# Patient Record
Sex: Female | Born: 1955
Health system: Southern US, Community
[De-identification: ages and names within clinical notes are randomized; demographics above are authoritative.]

## PROBLEM LIST (undated history)

## (undated) DIAGNOSIS — E669 Obesity, unspecified: Secondary | ICD-10-CM

## (undated) DIAGNOSIS — R0609 Other forms of dyspnea: Secondary | ICD-10-CM

## (undated) DIAGNOSIS — J45909 Unspecified asthma, uncomplicated: Secondary | ICD-10-CM

## (undated) DIAGNOSIS — Z8679 Personal history of other diseases of the circulatory system: Secondary | ICD-10-CM

## (undated) DIAGNOSIS — R7303 Prediabetes: Secondary | ICD-10-CM

## (undated) DIAGNOSIS — J302 Other seasonal allergic rhinitis: Secondary | ICD-10-CM

## (undated) DIAGNOSIS — R739 Hyperglycemia, unspecified: Secondary | ICD-10-CM

## (undated) DIAGNOSIS — E785 Hyperlipidemia, unspecified: Secondary | ICD-10-CM

## (undated) DIAGNOSIS — G5600 Carpal tunnel syndrome, unspecified upper limb: Secondary | ICD-10-CM

## (undated) DIAGNOSIS — F419 Anxiety disorder, unspecified: Secondary | ICD-10-CM

## (undated) DIAGNOSIS — I10 Essential (primary) hypertension: Secondary | ICD-10-CM

## (undated) DIAGNOSIS — E7800 Pure hypercholesterolemia, unspecified: Secondary | ICD-10-CM

## (undated) DIAGNOSIS — F32A Depression, unspecified: Secondary | ICD-10-CM

## (undated) DIAGNOSIS — F329 Major depressive disorder, single episode, unspecified: Secondary | ICD-10-CM

## (undated) DIAGNOSIS — I471 Supraventricular tachycardia: Secondary | ICD-10-CM

## (undated) DIAGNOSIS — R002 Palpitations: Secondary | ICD-10-CM

## (undated) DIAGNOSIS — M7580 Other shoulder lesions, unspecified shoulder: Secondary | ICD-10-CM

## (undated) HISTORY — DX: Essential (primary) hypertension: I10

## (undated) HISTORY — DX: Palpitations: R00.2

## (undated) HISTORY — PX: BREAST BIOPSY: SHX20

## (undated) HISTORY — DX: Hyperlipidemia, unspecified: E78.5

## (undated) HISTORY — DX: Personal history of other diseases of the circulatory system: Z86.79

## (undated) HISTORY — DX: Anxiety disorder, unspecified: F41.9

## (undated) HISTORY — DX: Supraventricular tachycardia: I47.1

## (undated) HISTORY — DX: Other seasonal allergic rhinitis: J30.2

## (undated) HISTORY — DX: Other forms of dyspnea: R06.09

## (undated) HISTORY — DX: Pure hypercholesterolemia, unspecified: E78.00

## (undated) HISTORY — DX: Depression, unspecified: F32.A

## (undated) HISTORY — PX: TOTAL ABDOMINAL HYSTERECTOMY: SHX209

## (undated) HISTORY — DX: Hyperglycemia, unspecified: R73.9

## (undated) HISTORY — PX: TUBAL LIGATION: SHX77

## (undated) HISTORY — DX: Unspecified asthma, uncomplicated: J45.909

## (undated) HISTORY — DX: Carpal tunnel syndrome, unspecified upper limb: G56.00

## (undated) HISTORY — DX: Prediabetes: R73.03

## (undated) HISTORY — DX: Other shoulder lesions, unspecified shoulder: M75.80

## (undated) HISTORY — DX: Obesity, unspecified: E66.9

## (undated) HISTORY — DX: Major depressive disorder, single episode, unspecified: F32.9

---

## 1999-05-26 ENCOUNTER — Other Ambulatory Visit: Admission: RE | Admit: 1999-05-26 | Discharge: 1999-05-26 | Payer: Self-pay | Admitting: Obstetrics and Gynecology

## 2000-08-01 ENCOUNTER — Other Ambulatory Visit: Admission: RE | Admit: 2000-08-01 | Discharge: 2000-08-01 | Payer: Self-pay | Admitting: Obstetrics and Gynecology

## 2001-09-30 ENCOUNTER — Emergency Department (HOSPITAL_COMMUNITY): Admission: EM | Admit: 2001-09-30 | Discharge: 2001-10-01 | Payer: Self-pay | Admitting: Emergency Medicine

## 2002-05-02 ENCOUNTER — Other Ambulatory Visit: Admission: RE | Admit: 2002-05-02 | Discharge: 2002-05-02 | Payer: Self-pay | Admitting: Obstetrics and Gynecology

## 2004-02-03 ENCOUNTER — Other Ambulatory Visit: Admission: RE | Admit: 2004-02-03 | Discharge: 2004-02-03 | Payer: Self-pay | Admitting: Obstetrics and Gynecology

## 2005-07-15 ENCOUNTER — Encounter: Payer: Self-pay | Admitting: Orthopedic Surgery

## 2005-08-02 ENCOUNTER — Ambulatory Visit: Payer: Self-pay | Admitting: Orthopedic Surgery

## 2005-08-04 ENCOUNTER — Ambulatory Visit (HOSPITAL_COMMUNITY): Admission: RE | Admit: 2005-08-04 | Discharge: 2005-08-04 | Payer: Self-pay | Admitting: Orthopedic Surgery

## 2005-08-04 ENCOUNTER — Encounter: Payer: Self-pay | Admitting: Orthopedic Surgery

## 2005-08-24 ENCOUNTER — Emergency Department (HOSPITAL_COMMUNITY): Admission: EM | Admit: 2005-08-24 | Discharge: 2005-08-24 | Payer: Self-pay | Admitting: Emergency Medicine

## 2005-09-06 ENCOUNTER — Encounter: Payer: Self-pay | Admitting: Orthopedic Surgery

## 2005-10-13 ENCOUNTER — Encounter: Admission: RE | Admit: 2005-10-13 | Discharge: 2005-11-03 | Payer: Self-pay | Admitting: Orthopedic Surgery

## 2006-04-11 ENCOUNTER — Other Ambulatory Visit: Admission: RE | Admit: 2006-04-11 | Discharge: 2006-04-11 | Payer: Self-pay | Admitting: Obstetrics and Gynecology

## 2006-04-21 ENCOUNTER — Encounter: Admission: RE | Admit: 2006-04-21 | Discharge: 2006-04-21 | Payer: Self-pay | Admitting: Obstetrics and Gynecology

## 2006-05-04 ENCOUNTER — Encounter: Admission: RE | Admit: 2006-05-04 | Discharge: 2006-05-04 | Payer: Self-pay | Admitting: Obstetrics and Gynecology

## 2006-05-19 ENCOUNTER — Encounter: Admission: RE | Admit: 2006-05-19 | Discharge: 2006-05-19 | Payer: Self-pay | Admitting: Obstetrics and Gynecology

## 2006-05-19 ENCOUNTER — Encounter (INDEPENDENT_AMBULATORY_CARE_PROVIDER_SITE_OTHER): Payer: Self-pay | Admitting: Specialist

## 2006-11-16 ENCOUNTER — Encounter (INDEPENDENT_AMBULATORY_CARE_PROVIDER_SITE_OTHER): Payer: Self-pay | Admitting: Specialist

## 2006-11-17 ENCOUNTER — Inpatient Hospital Stay (HOSPITAL_COMMUNITY): Admission: RE | Admit: 2006-11-17 | Discharge: 2006-11-18 | Payer: Self-pay | Admitting: Obstetrics and Gynecology

## 2006-11-28 ENCOUNTER — Encounter: Admission: RE | Admit: 2006-11-28 | Discharge: 2006-11-28 | Payer: Self-pay | Admitting: Obstetrics and Gynecology

## 2007-05-04 ENCOUNTER — Encounter: Admission: RE | Admit: 2007-05-04 | Discharge: 2007-05-04 | Payer: Self-pay | Admitting: Obstetrics and Gynecology

## 2007-07-04 DIAGNOSIS — Z8679 Personal history of other diseases of the circulatory system: Secondary | ICD-10-CM

## 2007-07-04 HISTORY — DX: Personal history of other diseases of the circulatory system: Z86.79

## 2007-07-05 ENCOUNTER — Ambulatory Visit: Payer: Self-pay | Admitting: Orthopedic Surgery

## 2007-07-05 DIAGNOSIS — G5600 Carpal tunnel syndrome, unspecified upper limb: Secondary | ICD-10-CM | POA: Insufficient documentation

## 2007-07-05 HISTORY — DX: Carpal tunnel syndrome, unspecified upper limb: G56.00

## 2008-07-26 ENCOUNTER — Encounter: Admission: RE | Admit: 2008-07-26 | Discharge: 2008-07-26 | Payer: Self-pay | Admitting: Obstetrics and Gynecology

## 2008-08-06 ENCOUNTER — Encounter: Admission: RE | Admit: 2008-08-06 | Discharge: 2008-08-06 | Payer: Self-pay | Admitting: Obstetrics and Gynecology

## 2010-01-23 ENCOUNTER — Encounter: Admission: RE | Admit: 2010-01-23 | Discharge: 2010-01-23 | Payer: Self-pay | Admitting: Obstetrics and Gynecology

## 2010-03-09 ENCOUNTER — Ambulatory Visit: Payer: Self-pay | Admitting: Orthopedic Surgery

## 2010-03-09 DIAGNOSIS — M7580 Other shoulder lesions, unspecified shoulder: Secondary | ICD-10-CM

## 2010-03-09 DIAGNOSIS — M25819 Other specified joint disorders, unspecified shoulder: Secondary | ICD-10-CM

## 2010-03-09 HISTORY — DX: Other specified joint disorders, unspecified shoulder: M25.819

## 2010-03-10 ENCOUNTER — Telehealth: Payer: Self-pay | Admitting: Orthopedic Surgery

## 2010-05-10 ENCOUNTER — Emergency Department (HOSPITAL_COMMUNITY): Admission: EM | Admit: 2010-05-10 | Discharge: 2010-05-10 | Payer: Self-pay | Admitting: Emergency Medicine

## 2010-09-06 ENCOUNTER — Encounter: Payer: Self-pay | Admitting: Obstetrics and Gynecology

## 2010-09-17 NOTE — Assessment & Plan Note (Signed)
Summary: RT SHOULDER PAIN NEEDS XR/UHC/BSF   Vital Signs:  Patient profile:   55 year old female Height:      65 inches Weight:      168 pounds Pulse rate:   80 / minute Resp:     16 per minute  Vitals Entered By: Fuller Canada MD (March 09, 2010 11:43 AM)  Visit Type:  new patient Referring Provider:  self Primary Provider:  na  CC:  right shoulder pain.  History of Present Illness: I saw Kathryn Gallegos in the office today for an initial visit.  She is a 55 years old woman with the complaint of:  right shoulder pain.  Xrays today.  Had MRI of C spine in 2006 showed mild multiple level disc bulges which really looks minor  She does complain of pain in her RIGHT shoulder after a fall during the winter and now has tall 6/10 intermittent pain which came on suddenly and is worse when she lies on her LEFT side in fact it wakes her up at night.  Her range of motion remains good she does not have any numbness or weakness she's been using Aspercreme for pain she does have a history of reflux and is recently been taken off of Nexium because her reflux improved.  Meds: Lisinopril, Ibuprofen 200 as needed, no relief.  Allergies (verified): No Known Drug Allergies  Past History:  Past Medical History: High Blood Pressure Reflux Sinus Allergies DDD of cervical spine diabetic  Past Surgical History: Tubal ligation 1985 hysterectomy  Family History: FH of Cancer:  Family History of Diabetes Family History Coronary Heart Disease female < 41 Family History of Arthritis  Social History: Patient is married.  medical insurance no smoking no alcohol no caffeine ass. degree  Review of Systems Constitutional:  Denies weight loss, weight gain, fever, chills, and fatigue. Cardiovascular:  Denies chest pain, palpitations, fainting, and murmurs. Respiratory:  Denies short of breath, wheezing, couch, tightness, pain on inspiration, and snoring . Gastrointestinal:  Denies  heartburn, nausea, vomiting, diarrhea, constipation, and blood in your stools. Genitourinary:  Denies frequency, urgency, difficulty urinating, painful urination, flank pain, and bleeding in urine. Neurologic:  Denies numbness, tingling, unsteady gait, dizziness, tremors, and seizure. Musculoskeletal:  Denies joint pain, swelling, instability, stiffness, redness, heat, and muscle pain. Endocrine:  Denies excessive thirst, exessive urination, and heat or cold intolerance. Psychiatric:  Denies nervousness, depression, anxiety, and hallucinations. Skin:  Denies changes in the skin, poor healing, rash, itching, and redness. HEENT:  Denies blurred or double vision, eye pain, redness, and watering. Immunology:  Denies seasonal allergies, sinus problems, and allergic to bee stings. Hemoatologic:  Denies easy bleeding and brusing.  Physical Exam  Msk:  The patient is well developed and nourished, with normal grooming and hygiene. The body habitus is  small, ectomorphic Pulses:  The pulses and perfusion were normal with normal color, temperature  and no swelling  Extremities:  cervical spine nontender normal range of motion normal muscle tone no deformity  RIGHT shoulder painful forward elevation with a positive impingement sign, her shoulder remains stable.  Internal rotators external rotators and supraspinatus have grade 5 strength there is full range of motion in flexion and in external rotation with decreased range of motion and internal rotation with a level of approximately T12 vs. T7 on the opposite side, there is some tenderness over the anterolateral acromion and in the area inferior to the posterior acromion no swelling.  LEFT shoulder full forward elevation full range  of motion full strength no instability no tenderness impingement sign was negative Neurologic:  The coordination and sensation were normal  The reflexes were normal   Skin:  intact without lesions or rashes Cervical Nodes:   no significant adenopathy Axillary Nodes:  no significant adenopathy Psych:  alert and cooperative; normal mood and affect; normal attention span and concentration   Impression & Recommendations:  Problem # 1:  IMPINGEMENT SYNDROME (ICD-726.2) Assessment New  Orders: New Patient Level III (65784) Joint Aspirate / Injection, Large (20610) Depo- Medrol 40mg  (J1030) Verbal consent obtained/The shoulder was injected with depomedrol 40mg /cc and sensorcaine .25% . There were no complications  Patient Instructions: 1)  rotator cuff tendonitis and bursitis  2)  You have received an injection of cortisone today. You may experience increased pain at the injection site. Apply ice pack to the area for 20 minutes every 2 hours and take 2 xtra strength tylenol every 8 hours. This increased pain will usually resolve in 24 hours. The injection will take effect in 3-10 days.  3)  do the exercises for the shoudlers for 6 weeks  4)  take norco for pain  5)  Please schedule a follow-up appointment as needed.

## 2010-09-17 NOTE — Letter (Signed)
Summary: History form  History form   Imported By: Jacklynn Ganong 03/11/2010 08:19:21  _____________________________________________________________________  External Attachment:    Type:   Image     Comment:   External Document

## 2010-09-17 NOTE — Progress Notes (Signed)
Summary: Office Visit  Office Visit   Imported By: Eldridge Dace 07/04/2007 11:49:43  _____________________________________________________________________  External Attachment:    Type:   Image     Comment:   initial evaluation

## 2010-09-17 NOTE — Progress Notes (Signed)
Summary: patient asking for Rx for CT  Phone Note Call from Patient   Caller: Patient Summary of Call: Patient called in, seen yesterday 7/25 for RT shoulder; states forgot to mention CT RT/numbness, prev seen for this prob in 2008. Asking if Dr Romeo Apple would prescribe Rx for this?  If so, [facility] in [place],  [place] # [phone] Initial call taken by: Cammie Sickle,  March 10, 2010 9:20 AM  Follow-up for Phone Call        vit b6 100mg  two times a day x 6 weeks   neurontin 100mg  three times a day  for 6 weeks  Follow-up by: Fuller Canada MD,  March 10, 2010 2:17 PM  Additional Follow-up for Phone Call Additional follow up Details #1::        called meds in, advised pt    New/Updated Medications: NEURONTIN 100 MG CAPS (GABAPENTIN) 1 by mouth two times a day for 6 weeks VITAMIN B-6 100 MG TABS (PYRIDOXINE HCL) one by mouth two times a day for 6 weeks Prescriptions: VITAMIN B-6 100 MG TABS (PYRIDOXINE HCL) one by mouth two times a day for 6 weeks  #60 x 1   Entered by:   Ether Griffins   Authorized by:   Fuller Canada MD   Signed by:   Ether Griffins on 03/10/2010   Method used:   Historical   RxID:   [id number] NEURONTIN 100 MG CAPS (GABAPENTIN) 1 by mouth two times a day for 6 weeks  #60 x 2   Entered by:   Ether Griffins   Authorized by:   Fuller Canada MD   Signed by:   Ether Griffins on 03/10/2010   Method used:   Historical   RxID:   [id number]

## 2011-01-01 NOTE — H&P (Signed)
NAME:  Kathryn Gallegos, Kathryn Gallegos NO.:  [id number]   MEDICAL RECORD NO.:  [id number]          PATIENT TYPE:  AMB   LOCATION:  SDC                           [facility]ILITY:  [facility]   PHYSICIAN:  Janine Limbo, M.D.DATE OF BIRTH:  21-Feb-1956   DATE OF ADMISSION:  DATE OF DISCHARGE:                              HISTORY & PHYSICAL   DATE OF SURGERY:  November 16, 2006.   HISTORY OF PRESENT ILLNESS:  Ms. Campau is a 55 year old female,  para 0-0-4-0, who presents for a laparoscopy-assisted vaginal  hysterectomy.  The patient has been followed at the [facility] of [facility].  The patient  complains of heavy and irregular periods.  An ultrasound was performed  that showed a uterus that measured 8.4 cm x 7.9 cm with multiple  fibroids.  The largest fibroid measured 4.14 cm.  The left ovary was  surgically absent.  The right ovary appeared normal.  The patient has  had a left ectopic pregnancy in the past and then was told of an  infection.  She had a laparotomy with a left salpingo-oophorectomy.  The the patient's last Pap smear was within normal limits.  An  endometrial biopsy was within normal limits.   OBSTETRICAL HISTORY:  The patient has had 1 ectopic pregnancy (1985) and  3 first trimester miscarriages.   PAST MEDICAL HISTORY:  The patient has a history of hypertension as well  as seasonal allergies.  She currently takes Claritin and lisinopril.  The patient had a breast biopsy in 2007 with benign findings.   DRUG ALLERGIES:  No known drug allergies or latex allergies.   SOCIAL HISTORY:  The patient drinks alcohol socially.  She denies  cigarette use and other recreational drug uses.   REVIEW OF SYSTEMS:  Please see history of present illness.   FAMILY HISTORY:  The patient's maternal aunt and uncle have  hypertension.  She has a first cousin with diabetes.  The patient's  mother had pancreatic cancer and her maternal  grandmother had esophageal  cancer.   PHYSICAL EXAMINATION:  Weight is 178 pounds.  Height is 5 feet 4 inches.  HEENT:  Is within normal limits.  CHEST:  Is clear.  HEART:  Regular rate and rhythm.  Her breasts are without masses.  Her abdomen is nontender.  Her extremities are grossly normal.  Her neurologic exam is grossly normal.  PELVIC EXAM:  External genitalia is normal.  Vagina is normal.  Cervix  is nontender.  Uterus is 8 weeks' size.  Adnexa no masses and  rectovaginal exam confirms.   ASSESSMENT:  1. Fibroid uterus.  2. Menorrhagia.  3. History of laparotomy with a left salpingo-oophorectomy.  The      patient was told that she had an infection.   PLAN:  The patient will undergo a laparoscopy-assisted vaginal  hysterectomy.  The patient understands the indications for her surgical  procedure, and she accepts the risks of, but not limited to, anesthetic  complications, bleeding, infections, and possible damage to the  surrounding organs.  We discussed ovarian preservation.  The  patient  strongly wants to keep her ovary.  She understands the risk and benefits  of ovarian preservation compared to the risk and benefits of ovarian  removal and the possibility of subsequent hormone replacement therapy.      Janine Limbo, M.D.  Electronically Signed     AVS/MEDQ  D:  11/09/2006  T:  11/09/2006  Job:  [id number]

## 2011-01-01 NOTE — Discharge Summary (Signed)
NAME:  Kathryn Gallegos, TARNOW NO.:  [id number]   MEDICAL RECORD NO.:  [id number]          PATIENT TYPE:  INP   LOCATION:  9317                          [facility]ILITY:  [facility]   PHYSICIAN:  Janine Limbo, M.D.DATE OF BIRTH:  1955-10-26   DATE OF ADMISSION:  11/16/2006  DATE OF DISCHARGE:  11/18/2006                               DISCHARGE SUMMARY   DISCHARGE DIAGNOSES:  1. Fibroid uterus.  2. Menorrhagia.  3. Status post laparotomy for left ectopic pregnancy.  4. Obesity.  5. Hypertension.  6. Pelvic adhesions.  7. Right ovarian cyst.   OPERATION:  On the day of admission the patient underwent a  laparoscopically-assisted vaginal hysterectomy with lysis of adhesions  and uterine morcellation, tolerating procedure well.  The patient was  found to have a 14 weeks size uterus with multiple fibroids along with  pelvic adhesions.   HISTORY OF PRESENT ILLNESS:  [name] is a 55 year old female para  0-0-4-0 who presents for a laparoscopically-assisted vaginal  hysterectomy because of symptomatic uterine fibroids.  Please see the  patient's dictated history and physical examination for details.   PREOP PHYSICAL EXAM:  VITAL SIGNS:  Weight 220 pounds, height 5 feet 4  inches tall.  GENERAL:  Exam is within normal limits.  PELVIC:  External genitalia is normal, vagina is normal, cervix is  nontender.  Uterus appeared 8 weeks size.  Adnexa no masses.  Rectovaginal exam confirms.   HOSPITAL COURSE:  On the date of admission the patient underwent  aforementioned procedures tolerating them all well.  The patient's  hospital course was unremarkable with patient resuming bowel and bladder  function by postop day #2 and therefore deemed ready for discharge home.  Postop hemoglobin 12.2 (preop hemoglobin 14.1).   DISCHARGE MEDICATIONS:  1. Ibuprofen 600 mg 1 tablet every 6 hours with food as needed for      mild to moderate pain.  2. Darvocet N 100 1-2 tablets every 4  hours as needed for severe pain.  3. Vicodin 1-2 tablets every 4 hours as needed for severe pain.  4. Phenergan 25 mg 1 tablet every 6 hours as needed for nausea.  5. Iron 325 mg 1 tablet twice daily for 6 weeks.   FOLLOW UP:  The patient is to call to schedule a 3-week follow-up visit  with Dr. Stefano Gaul at [phone].   DISCHARGE INSTRUCTIONS:  The patient was given a copy of [facility] postoperative instruction sheet.  She was further  advised to avoid driving for 1-2 weeks, heavy lifting for 4 weeks,  intercourse for 6 weeks, that she may shower, walk up steps, is to  increase her activities slowly.  She was further advised to have a low-  salt diet.   FINAL PATHOLOGY:  Uterus hysterectomy:  Benign cervix with chronic  cervicitis; benign secretory endometrium; and leiomyomata.      [name].      Janine Limbo, M.D.  Electronically Signed    EJP/MEDQ  D:  12/19/2006  T:  12/19/2006  Job:  [id number]

## 2011-01-01 NOTE — Op Note (Signed)
NAME:  Kathryn Gallegos, Kathryn Gallegos NO.:  [id number]   MEDICAL RECORD NO.:  [id number]          PATIENT TYPE:  AMB   LOCATION:  SDC                           [facility]   PHYSICIAN:  Janine Limbo, M.D.DATE OF BIRTH:  [birth year]   DATE OF PROCEDURE:  11/16/2006  DATE OF DISCHARGE:                               OPERATIVE REPORT   PREOPERATIVE DIAGNOSIS:  1. Fibroid uterus.  2. Menorrhagia  3. History of a laparotomy for a left ectopic pregnancy.  The patient      was told that she had an infection.  4. Obesity (weight 220 pounds, height 5 feet 4 inches).  5. Hypertension.   POSTOPERATIVE DIAGNOSIS:  1. Fibroid uterus.  2. Menorrhagia  3. History of a laparotomy for a left ectopic pregnancy.  The patient      was told that she had an infection.  4. Obesity (weight 220 pounds, height 5 feet 4 inches).  5. Hypertension.  6. Pelvic adhesions.  7. Right ovarian cyst.   OPERATION/PROCEDURE:  1. Laparoscopically-assisted vaginal hysterectomy.  2. Laparoscopic lysis of adhesions.  3. Uterine morcellation.   SURGEON:  Janine Limbo, M.D.   FIRST ASSISTANT:  Naima A. Normand Sloop, M.D.   ANESTHESIA:  General.   INDICATIONS:  Mrs. Kathryn Gallegos is 55 year old female, para 0-0-4-0, who  presents with the above-mentioned diagnoses.  She has been followed at  the [facility].  She presents for a laparoscopically-assisted vaginal hysterectomy.  We  discussed our management options and the risk associated with each of  those options.  The patient has elected to proceed as mentioned above.  She understands the specific risks associated with LAVH.  which include,  but are not limited to, anesthetic complications, bleeding, infection,  and possible damage to the surrounding organs.  We discussed the  benefits and the risk of oophorectomy as well as the benefits and the  risk of ovarian preservation.  The patient is not sure  whether or not  her left ovary was removed at the time of laparotomy many years ago.  After carefully considering her options, the patient has elected to keep  her ovaries if at all possible.   FINDINGS:  The uterus was approximately 14 weeks size and it contained  multiple subserosal and intramural fibroids.  The estimated weight of  the uterus was approximately 370 grams.  The left fallopian tube and  left ovary were surgically absent.  The right ovary contained a 4 cm  clear cyst that was thought to be consistent with ovulation.  There were  moderate to dense adhesions present in the pelvis and the abdomen.  The  omentum and the large bowel were adherent to the right anterior  abdominal wall.  The large bowel was adherent to the right pelvic  sidewall.  The right fallopian tube and the right ovary were adherent to  the right posterior uterus.  There were filmy adhesions between the  large bowel and the right ovary.  There were mild to moderate adhesions  between the large bowel and the left pelvic  sidewall.  There were  moderate adhesions between the omentum and the right midabdominal  anterior wall.  The anterior cul-de-sac was clear and free.  The  posterior cul-de-sac was clear and free.  The bowel otherwise appeared  normal.  The liver appeared normal.  The start time for the procedure  was 9:25 a.m..  The ending time for the procedure was 11:53 a.m.Marland Kitchen   DESCRIPTION OF PROCEDURE:  The patient was taken to the operating room  where general anesthetic was given.  The patient's abdomen, perineum,  and vagina were prepped with multiple layers of Betadine.  A Foley  catheter was placed in the bladder.  Examination under anesthesia was  performed.  The uterus was noted to be larger than previously suspected.  A Hulka tenaculum was placed inside the uterus.  The patient was then  sterilely draped.  The subumbilical area was injected with 4 mL of 0.5%  Marcaine with epinephrine.  A  subumbilical incision was made and the  Veress needle was inserted into the abdominal cavity without difficulty.  Proper placement was confirmed using the saline drop test.  A  pneumoperitoneum was then obtained.  The laparoscopic trocar was then  substituted for the Veress needle.  The laparoscope was inserted and the  pelvis was visualized with findings as mentioned above.  The left lower  abdomen was injected with 3 mL of 0.5% Marcaine with epinephrine.  An  incision was made and a 5 mm trocar was placed in the lower abdomen  under direct visualization.  Lysis of adhesions was performed so that we  could insert the trocar in the right lower quadrant.  The adhesions  between the large bowel and the right anterior abdominal wall were then  lysed.  The adhesions between the right fallopian tube and the right  ovary and the right pelvic sidewall and anterior abdominal wall were  lysed.  At this point we felt comfortable placing our second suprapubic  port.  The right lower abdomen was injected with 3 mL of 5 Marcaine with  epinephrine.  An incision was made.  A 5 mm trocar was placed under  direct visualization.  At this point,  we then continued our lysis of  adhesions so that we could ultimately perform our laparoscopically-  assisted vaginal hysterectomy.  The adhesions between the right ovary  and the right posterior uterus were then lysed using a combination of  sharp dissection and cautery of the vessels in the adhesions.  Care was  taken not to damage the ovary, the bowel, or any of the vital pelvic  structures.  Once the adhesions were all lysed, we then identified the  right round ligament.  The right round ligament was then cauterized in  several sections.  The bipolar cautery was used.  The right round  ligament was then cut.  We identified the right fallopian tube and a  proximal portion was cauterized and cut.  We developed the bladder flap anteriorly.  We then cauterized the  right utero-ovarian ligament.  The  ligament was cut.  The parametrial tissues were then sharply and bluntly  pushed from the uterine artery.  Care was taken again not to damage the  ureters or any of the vital structures.  Pictures were taken of the  previous portions of the operative procedure.  At this point we felt  that we were ready to proceed with the vaginal portion of the procedure.  The patient was placed in a more  lithotomy position.  The cervix was  injected with a diluted solution of Pitressin and saline.  A  circumferential incision was made around the cervix and the vaginal  mucosa was advanced anteriorly and posteriorly.  The posterior cul-de-  sac was sharply entered.  Alternating from right to left, the  uterosacral ligaments, paracervical tissues, parametrial tissues, and  the uterine arteries were clamped, cut, sutured, and tied securely.  We  tried to invert the uterus through the posterior colpotomy, but we were  not successful because of the large fibroids.  Therefore, we began to  morcellate the uterus by removing fibroids from the posterior surface  and parts of the myometrium.  We were finally able to invert the uterus  through the posterior colpotomy.  There was only a small portion of  tissue remaining at the upper pedicles.  This tissue was clamped and cut  and the uterus was removed from the operative field, although it was  then multiple pieces.  The estimated weight of the specimen was 370  grams.  Hemostasis was achieved using figure-of-eight sutures.  The  pelvis was carefully inspected and hemostasis was noted to be adequate.  The sutures attached to the uterosacral ligaments were brought out  through the vaginal angles and tied securely.  A McCall culdoplasty  suture was placed in the posterior cul-de-sac, incorporating the  uterosacral ligaments bilaterally and the posterior peritoneum.  A final  check was made for hemostasis and again hemostasis was  confirmed  throughout.  The vaginal cuff was closed using figure-of-eight sutures  incorporating the anterior vaginal mucosa, the anterior peritoneum,  posterior peritoneum, and the posterior vaginal mucosa.  The McCall  culdoplasty suture was tied securely and the apex of the vagina was  noted to elevate into the mid pelvis and 0 Vicryl was the suture  material used to this point in the procedure.  Sponge, needle, and  instrument counts were correct on two occasions.  The estimated blood  loss for the procedure was 150 mL.  The patient was noted to drain clear  yellow urine.   The operator then changed gloves.  An apron was placed over the current  gown.  The patient was placed in a more supine position.  The  pneumoperitoneum was reestablished.  The laparoscope was again inserted.  The right ovary was carefully inspected and was noted to be normal.  The  cyst on the right ovary drained during the operative procedure and clear fluid was noted.  Hemostasis was noted to be adequate throughout.  The  ureters were identified and there was no evidence of damage to the  ureters or any of the vital pelvic structures.  The bowel was carefully  inspected and there was no evidence of damage to the bowel.  The pelvis  was irrigated and the irrigation fluid was aspirated.  At this point we  felt that we were ready to end our procedure.  The suprapubic trocars  were removed under direct visualization.  The pneumoperitoneum was  allowed to escape.  Hemostasis was again confirmed.  The subumbilical  trocar was removed and there was no evidence of entangle the bowel.  The  fascia of the subumbilical incision was closed using a figure-of-eight  suture of 0 Vicryl.  The skin of all incisions was closed using 3-0  Monocryl.  Again hemostasis was confirmed.  The patient was noted to  have a slight breathing incident as she was awakened from her  anesthetic, although  a better characterization was not given  to me.  She  did not seem to have any trouble breathing and she was prepared to  report to the recovery room and she was noted to be stable.  The patient  received 1 gram of Ancef on-call to the operating room and she received  30 mg of Toradol intravenously and 30 mg of Toradol intramuscularly  approximately 30 minutes before the termination of the procedure.      Janine Limbo, M.D.  Electronically Signed     AVS/MEDQ  D:  11/16/2006  T:  11/16/2006  Job:  [id number]

## 2011-03-15 ENCOUNTER — Other Ambulatory Visit: Payer: Self-pay | Admitting: Orthopedic Surgery

## 2011-03-15 DIAGNOSIS — R52 Pain, unspecified: Secondary | ICD-10-CM

## 2011-04-16 ENCOUNTER — Other Ambulatory Visit: Payer: Self-pay | Admitting: Obstetrics and Gynecology

## 2011-04-16 DIAGNOSIS — Z1231 Encounter for screening mammogram for malignant neoplasm of breast: Secondary | ICD-10-CM

## 2011-04-29 ENCOUNTER — Ambulatory Visit
Admission: RE | Admit: 2011-04-29 | Discharge: 2011-04-29 | Disposition: A | Discharge status: Home / Self Care | Payer: 59 | Source: Ambulatory Visit | Attending: Obstetrics and Gynecology | Admitting: Obstetrics and Gynecology

## 2011-04-29 DIAGNOSIS — Z1231 Encounter for screening mammogram for malignant neoplasm of breast: Secondary | ICD-10-CM

## 2011-05-13 ENCOUNTER — Ambulatory Visit (INDEPENDENT_AMBULATORY_CARE_PROVIDER_SITE_OTHER): Payer: 59 | Admitting: Orthopedic Surgery

## 2011-05-13 ENCOUNTER — Ambulatory Visit: Payer: Self-pay | Admitting: Orthopedic Surgery

## 2011-05-13 ENCOUNTER — Encounter: Payer: Self-pay | Admitting: Orthopedic Surgery

## 2011-05-13 DIAGNOSIS — M25519 Pain in unspecified shoulder: Secondary | ICD-10-CM

## 2011-05-13 DIAGNOSIS — R52 Pain, unspecified: Secondary | ICD-10-CM

## 2011-05-13 MED ORDER — HYDROCODONE-ACETAMINOPHEN 5-325 MG PO TABS: 1.0000 | ORAL_TABLET | Freq: Four times a day (QID) | ORAL | Status: DC | PRN

## 2011-05-13 MED ORDER — METHYLPREDNISOLONE ACETATE 40 MG/ML IJ SUSP: 40.0000 mg | Freq: Once | INTRAMUSCULAR | Status: DC

## 2011-05-13 MED ORDER — GABAPENTIN 100 MG PO CAPS: 100.0000 mg | ORAL_CAPSULE | Freq: Three times a day (TID) | ORAL | Status: DC

## 2011-05-13 MED ORDER — CYCLOBENZAPRINE HCL 10 MG PO TABS: 10.0000 mg | ORAL_TABLET | Freq: Three times a day (TID) | ORAL | Status: AC | PRN

## 2011-05-13 NOTE — Progress Notes (Signed)
Referral  Chief complaint LEFT shoulder pain  The pain started 2 months ago Came on suddenly No injury No treatment Pain described as sharp Pain scale 8/10 Pain constant, occurs morning and night Improved with muscle relaxers and or pain pill Worse with lifting and turning over in bed Fingers become numb at times.  Review of systems positive findings heart palpitations, wheezing, snoring, diarrhea, frequency, joint pain, muscle pain, numbness, depression, excessive thirst and urination, seasonal ALLERGIES. Negative weight loss blurred vision heartburn skin changes easy bleeding  The past, family history and social history have been reviewed and are recorded above  Physical Exam(12) GENERAL: normal development, grooming, and hygiene   CDV: pulses are normal, there is no edema, extremities are warm to touch   Skin: normal, and all extremities  Lymph: nodes were not palpable/normal, cervical and supraclavicular  Psychiatric: awake, alert and oriented  Neuro: normal sensation  MSK neck no tenderness normal range of motion   Neck exam: as noted   1LEFT shoulder, painful for elevation, range of motion is full. Positive impingement sign. Normal strength in the rotator cuff. No instability. Mild tenderness in the middle and anterior deltoid interval.  Imaging:separtate x-rays report  2 views of the LEFT shoulder LEFT shoulder pain Type II acromion, normal glenohumeral joint Impression normal LEFT shoulder  Assessment: Rotator cuff syndrome    Plan: Subacromial injection left shoulder   Subacromial Shoulder Injection Procedure Note  Pre-operative Diagnosis: left RC Syndrome  Post-operative Diagnosis: same  Indications: pain   Anesthesia: ethyl chloride   Procedure Details   Verbal consent was obtained for the procedure. The shoulder was prepped withalcohol and the skin was anesthetized. A 20 gauge needle was advanced into the subacromial space through posterior  approach without difficulty  The space was then injected with 3 ml 1% lidocaine and 1 ml of depomedrol. The injection site was cleansed with isopropyl alcohol and a dressing was applied.  Complications:  None; patient tolerated the procedure well.

## 2011-05-13 NOTE — Patient Instructions (Addendum)
You have received a steroid shot. 15% of patients experience increased pain at the injection site with in the next 24 hours. This is best treated with ice and tylenol extra strength 2 tabs every 8 hours. If you are still having pain please call the office.  Shoulder Pain You were seen today with shoulder pain. The shoulder is a ball and socket joint. The muscles and tendons (rotator cuff) are what keep the shoulder in its joint and stable. This collection of muscles and tendons holds in the head (ball) of the humerus (upper arm bone) in the fossa (cup) of the scapula (shoulder blade). Today no reason was found for your shoulder pain. Often pain in the shoulder may be treated conservatively with temporary immobilization. For example, holding the shoulder in one place using a sling for rest. Physical therapy may be needed if problems continue. HOME CARE INSTRUCTIONS  Apply ice to the sore area for 20 minutes 3 times per day for the first 2 days. Put the ice in a plastic bag. Place a towel between the bag of ice and your skin.   You may want to sleep on several pillows at night to lessen swelling and pain.   Only take over-the-counter or prescription medicines for pain, discomfort, or fever as directed by your caregiver.   It is very important to keep any follow-up appointments in order to avoid any type of permanent shoulder disability or chronic pain problems.  SEEK MEDICAL CARE IF:  Pain in your shoulder increases or new pain develops in your arm, hand, or fingers.   Your hand or fingers are colder than your other hand.   You do not obtain pain relief with the medications or your pain becomes worse.  SEEK IMMEDIATE MEDICAL CARE IF:  Your arm, hand, or fingers are numb or tingling.   Your arm, hand, or fingers are swollen, painful, or turn white or blue.   You develop chest pain or shortness of breath.  MAKE SURE YOU:    Understand these instructions.   Will watch your condition.    Will get help right away if you are not doing well or get worse.  Document Released: 05/12/2005 Document Re-Released: 10/29/2008 [facility] Patient Information 2011 [place], [place].

## 2011-06-23 ENCOUNTER — Ambulatory Visit: Payer: 59 | Admitting: Orthopedic Surgery

## 2011-08-03 ENCOUNTER — Ambulatory Visit: Payer: 59 | Admitting: Orthopedic Surgery

## 2011-08-03 ENCOUNTER — Encounter: Payer: Self-pay | Admitting: Orthopedic Surgery

## 2012-06-07 ENCOUNTER — Other Ambulatory Visit: Payer: Self-pay | Admitting: Obstetrics and Gynecology

## 2012-06-07 DIAGNOSIS — Z1231 Encounter for screening mammogram for malignant neoplasm of breast: Secondary | ICD-10-CM

## 2012-07-11 ENCOUNTER — Ambulatory Visit: Payer: 59

## 2012-07-14 ENCOUNTER — Ambulatory Visit
Admission: RE | Admit: 2012-07-14 | Discharge: 2012-07-14 | Disposition: A | Discharge status: Home / Self Care | Payer: 59 | Source: Ambulatory Visit | Attending: Obstetrics and Gynecology | Admitting: Obstetrics and Gynecology

## 2012-07-14 DIAGNOSIS — Z1231 Encounter for screening mammogram for malignant neoplasm of breast: Secondary | ICD-10-CM

## 2012-07-18 ENCOUNTER — Other Ambulatory Visit: Payer: Self-pay | Admitting: Obstetrics and Gynecology

## 2012-07-18 DIAGNOSIS — R928 Other abnormal and inconclusive findings on diagnostic imaging of breast: Secondary | ICD-10-CM

## 2012-07-26 ENCOUNTER — Encounter: Payer: Self-pay | Admitting: Obstetrics and Gynecology

## 2012-07-26 ENCOUNTER — Ambulatory Visit (INDEPENDENT_AMBULATORY_CARE_PROVIDER_SITE_OTHER): Payer: 59 | Admitting: Obstetrics and Gynecology

## 2012-07-26 VITALS — BP 112/72 | Ht 62.0 in | Wt 181.0 lb

## 2012-07-26 DIAGNOSIS — Z01419 Encounter for gynecological examination (general) (routine) without abnormal findings: Secondary | ICD-10-CM

## 2012-07-26 NOTE — Progress Notes (Signed)
Subjective:    Kathryn Gallegos is a 56 y.o. female No obstetric history on file. who presents for annual exam. The patient is status post hysterectomy for fibroids.  The patient's mother died from pancreatic cancer.  The following portions of the patient's history were reviewed and updated as appropriate: allergies, current medications, past family history, past medical history, past social history, past surgical history and problem list.  Review of Systems Pertinent items are noted in HPI. Gastrointestinal:No change in bowel habits, no abdominal pain, no rectal bleeding Genitourinary:negative for dysuria, frequency, hematuria, nocturia and urinary incontinence    Objective:     BP 112/72  Ht 5\' 2"  (1.575 m)  Wt 181 lb (82.101 kg)  BMI 33.11 kg/m2  Weight:  Wt Readings from Last 1 Encounters:  07/26/12 181 lb (82.101 kg)     BMI: Body mass index is 33.11 kg/(m^2). General Appearance: Alert, appropriate appearance for age. No acute distress HEENT: Grossly normal Neck / Thyroid: Supple, no masses, nodes or enlargement Lungs: clear to auscultation bilaterally Back: No CVA tenderness Breast Exam: No masses or nodes.No dimpling, nipple retraction or discharge. Cardiovascular: Regular rate and rhythm. S1, S2, no murmur Gastrointestinal: Soft, non-tender, no masses or organomegaly  ++++++++++++++++++++++++++++++++++++++++++++++++++++++++  Pelvic Exam: External genitalia: normal general appearance Vaginal: normal without tenderness, induration or masses and relaxation noted, atrophic Cervix: absent Adnexa: normal bimanual exam Uterus: absent Rectovaginal: normal rectal, no masses  ++++++++++++++++++++++++++++++++++++++++++++++++++++++++  Lymphatic Exam: Non-palpable nodes in neck, clavicular, axillary, or inguinal regions  Psychiatric: Alert and oriented, appropriate affect.      Assessment:    Normal gyn exam   Overweight or obese: Yes  Pelvic relaxation:  No  Menopausal symptoms: Yes. Severe: No.  Hypertension  Borderline diabetes has resolved  Mother with pancreatic cancer   Plan:    followup care with her primary physician  Follow-up:  for annual exam  The updated Pap smear screening guidelines were discussed with the patient. The patient requested that I obtain a Pap smear: No.  Kegel exercises discussed: No.  Proper diet and regular exercise were reviewed.  Annual mammograms recommended starting at age 54. Proper breast care was discussed.  Screening colonoscopy is recommended beginning at age 73.  Regular health maintenance was reviewed.  Sleep hygiene was discussed.  Leonard Schwartz M.D.  Regular Periods: no Hyst  Mammogram: yes  Monthly Breast Ex.: yes Exercise: yes  Tetanus < 10 years: yes Seatbelts: yes  NI. Bladder Functn.: yes Abuse at home: no  Daily BM's: yes Stressful Work: yes  Healthy Diet: yes Sigmoid-Colonoscopy: about 25 years ago   Calcium: yes Medical problems this year: none   LAST PAP:2009  Contraception: HYST   Mammogram:  07/14/2012  PCP: Dr.Sanders   PMH: unchanged  FMH: unchanged   Last Bone Scan: never had one  Pt stated no issues today.

## 2012-07-27 ENCOUNTER — Other Ambulatory Visit: Payer: Self-pay | Admitting: Obstetrics and Gynecology

## 2012-07-27 ENCOUNTER — Ambulatory Visit
Admission: RE | Admit: 2012-07-27 | Discharge: 2012-07-27 | Disposition: A | Discharge status: Home / Self Care | Payer: 59 | Source: Ambulatory Visit | Attending: Obstetrics and Gynecology | Admitting: Obstetrics and Gynecology

## 2012-07-27 DIAGNOSIS — R928 Other abnormal and inconclusive findings on diagnostic imaging of breast: Secondary | ICD-10-CM

## 2012-07-27 DIAGNOSIS — R921 Mammographic calcification found on diagnostic imaging of breast: Secondary | ICD-10-CM

## 2012-08-07 ENCOUNTER — Ambulatory Visit
Admission: RE | Admit: 2012-08-07 | Discharge: 2012-08-07 | Disposition: A | Discharge status: Home / Self Care | Payer: 59 | Source: Ambulatory Visit | Attending: Obstetrics and Gynecology | Admitting: Obstetrics and Gynecology

## 2012-08-07 ENCOUNTER — Other Ambulatory Visit: Payer: Self-pay | Admitting: Obstetrics and Gynecology

## 2012-08-07 ENCOUNTER — Other Ambulatory Visit: Payer: Self-pay

## 2012-08-07 DIAGNOSIS — R921 Mammographic calcification found on diagnostic imaging of breast: Secondary | ICD-10-CM

## 2013-05-03 ENCOUNTER — Ambulatory Visit (INDEPENDENT_AMBULATORY_CARE_PROVIDER_SITE_OTHER): Payer: 59 | Admitting: Orthopedic Surgery

## 2013-05-03 VITALS — BP 148/90 | Ht 64.0 in | Wt 181.0 lb

## 2013-05-03 DIAGNOSIS — G5600 Carpal tunnel syndrome, unspecified upper limb: Secondary | ICD-10-CM

## 2013-05-03 MED ORDER — CYCLOBENZAPRINE HCL 10 MG PO TABS: 10.0000 mg | ORAL_TABLET | Freq: Three times a day (TID) | ORAL | Status: DC

## 2013-05-03 MED ORDER — HYDROCODONE-ACETAMINOPHEN 5-325 MG PO TABS: 1.0000 | ORAL_TABLET | Freq: Four times a day (QID) | ORAL | Status: DC | PRN

## 2013-05-03 NOTE — Progress Notes (Signed)
Patient ID: Kathryn Gallegos, female   DOB: [birth year], 56 y.o.   MRN: [id number]  Chief Complaint  Patient presents with  . 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 Interestingly she gets relief with hydrocodone and Flexeril. Her pain is described as burning sharp 8/10 comes and goes worse at night.  Positive findings for review of systems include fatigue palpitations wheezing and snoring frequency excessive thirst seasonal allergies the other systems 8 were reviewed and were normal  BP 148/90  Ht 5\' 4"  (1.626 m)  Wt 181 lb (82.101 kg)  BMI 31.05 kg/m2  She is well-developed and well-nourished grooming and hygiene is normal she is oriented x3 her mood and affect are normal she ambulated without difficulty. Her hands look swollen or tender over the carpal tunnel and thenar eminence she has normal range of motion wrist joints are stable motor function is normal skin is intact pulses are good decreased sensation in the median nerve distribution lymph nodes are negative  Impression bilateral carpal tunnel syndrome  Bilateral carpal tunnel injection  Followup as needed

## 2013-05-03 NOTE — Patient Instructions (Addendum)
You have received a steroid shot. 15% of patients experience increased pain at the injection site with in the next 24 hours. This is best treated with ice and tylenol extra strength 2 tabs every 8 hours. If you are still having pain please call the office.    

## 2013-06-26 ENCOUNTER — Other Ambulatory Visit: Payer: Self-pay

## 2013-06-26 DIAGNOSIS — Z1231 Encounter for screening mammogram for malignant neoplasm of breast: Secondary | ICD-10-CM

## 2013-08-13 ENCOUNTER — Ambulatory Visit
Admission: RE | Admit: 2013-08-13 | Discharge: 2013-08-13 | Disposition: A | Discharge status: Home / Self Care | Payer: 59 | Source: Ambulatory Visit

## 2013-08-13 DIAGNOSIS — Z1231 Encounter for screening mammogram for malignant neoplasm of breast: Secondary | ICD-10-CM

## 2014-07-22 ENCOUNTER — Other Ambulatory Visit: Payer: Self-pay

## 2014-07-22 DIAGNOSIS — Z1231 Encounter for screening mammogram for malignant neoplasm of breast: Secondary | ICD-10-CM

## 2014-08-14 ENCOUNTER — Ambulatory Visit
Admission: RE | Admit: 2014-08-14 | Discharge: 2014-08-14 | Disposition: A | Discharge status: Home / Self Care | Payer: 59 | Source: Ambulatory Visit

## 2014-08-14 DIAGNOSIS — Z1231 Encounter for screening mammogram for malignant neoplasm of breast: Secondary | ICD-10-CM

## 2014-10-03 ENCOUNTER — Other Ambulatory Visit: Payer: Self-pay | Admitting: Gastroenterology

## 2014-10-03 DIAGNOSIS — R1084 Generalized abdominal pain: Secondary | ICD-10-CM

## 2014-10-09 ENCOUNTER — Ambulatory Visit (HOSPITAL_COMMUNITY)
Admission: RE | Admit: 2014-10-09 | Discharge: 2014-10-09 | Disposition: A | Discharge status: Home / Self Care | Payer: 59 | Source: Ambulatory Visit | Attending: Gastroenterology | Admitting: Gastroenterology

## 2014-10-09 DIAGNOSIS — R1013 Epigastric pain: Secondary | ICD-10-CM | POA: Diagnosis present

## 2014-10-09 DIAGNOSIS — R1084 Generalized abdominal pain: Secondary | ICD-10-CM

## 2014-10-09 MED ORDER — IOHEXOL 300 MG/ML  SOLN
100.0000 mL | Freq: Once | INTRAMUSCULAR | Status: AC | PRN
Start: 2014-10-09 — End: 2014-10-09
  Administered 2014-10-09: 80 mL via INTRAVENOUS

## 2014-10-31 ENCOUNTER — Other Ambulatory Visit: Payer: Self-pay | Admitting: Gastroenterology

## 2014-10-31 DIAGNOSIS — R935 Abnormal findings on diagnostic imaging of other abdominal regions, including retroperitoneum: Secondary | ICD-10-CM

## 2014-12-18 ENCOUNTER — Ambulatory Visit (HOSPITAL_COMMUNITY): Admission: RE | Admit: 2014-12-18 | Payer: BLUE CROSS/BLUE SHIELD | Source: Ambulatory Visit

## 2014-12-30 ENCOUNTER — Ambulatory Visit (HOSPITAL_COMMUNITY): Admission: RE | Admit: 2014-12-30 | Payer: BLUE CROSS/BLUE SHIELD | Source: Ambulatory Visit

## 2015-08-21 ENCOUNTER — Other Ambulatory Visit: Payer: Self-pay

## 2015-08-21 DIAGNOSIS — Z1231 Encounter for screening mammogram for malignant neoplasm of breast: Secondary | ICD-10-CM

## 2015-09-02 ENCOUNTER — Ambulatory Visit: Payer: BLUE CROSS/BLUE SHIELD

## 2015-09-11 ENCOUNTER — Ambulatory Visit
Admission: RE | Admit: 2015-09-11 | Discharge: 2015-09-11 | Disposition: A | Discharge status: Home / Self Care | Payer: 59 | Source: Ambulatory Visit

## 2015-09-11 DIAGNOSIS — Z1231 Encounter for screening mammogram for malignant neoplasm of breast: Secondary | ICD-10-CM

## 2016-01-29 ENCOUNTER — Other Ambulatory Visit: Payer: Self-pay | Admitting: Gastroenterology

## 2016-01-29 DIAGNOSIS — R935 Abnormal findings on diagnostic imaging of other abdominal regions, including retroperitoneum: Secondary | ICD-10-CM

## 2016-01-29 DIAGNOSIS — R1013 Epigastric pain: Secondary | ICD-10-CM

## 2016-02-10 ENCOUNTER — Other Ambulatory Visit: Payer: 59

## 2016-02-10 ENCOUNTER — Ambulatory Visit
Admission: RE | Admit: 2016-02-10 | Discharge: 2016-02-10 | Disposition: A | Discharge status: Home / Self Care | Payer: 59 | Source: Ambulatory Visit | Attending: Gastroenterology | Admitting: Gastroenterology

## 2016-02-10 DIAGNOSIS — R935 Abnormal findings on diagnostic imaging of other abdominal regions, including retroperitoneum: Secondary | ICD-10-CM

## 2016-02-10 DIAGNOSIS — R1013 Epigastric pain: Secondary | ICD-10-CM

## 2016-02-10 MED ORDER — GADOBENATE DIMEGLUMINE 529 MG/ML IV SOLN
17.0000 mL | Freq: Once | INTRAVENOUS | Status: AC | PRN
  Administered 2016-02-10: 17 mL via INTRAVENOUS

## 2016-02-20 ENCOUNTER — Other Ambulatory Visit: Payer: Self-pay | Admitting: Gastroenterology

## 2016-02-20 DIAGNOSIS — R1013 Epigastric pain: Secondary | ICD-10-CM

## 2016-03-08 ENCOUNTER — Other Ambulatory Visit (HOSPITAL_COMMUNITY): Payer: 59

## 2016-03-08 ENCOUNTER — Ambulatory Visit (HOSPITAL_COMMUNITY): Payer: 59

## 2016-03-17 ENCOUNTER — Encounter (HOSPITAL_COMMUNITY)
Admission: RE | Admit: 2016-03-17 | Discharge: 2016-03-17 | Disposition: A | Discharge status: Home / Self Care | Payer: 59 | Source: Ambulatory Visit | Attending: Gastroenterology | Admitting: Gastroenterology

## 2016-03-17 ENCOUNTER — Ambulatory Visit (HOSPITAL_COMMUNITY)
Admission: RE | Admit: 2016-03-17 | Discharge: 2016-03-17 | Disposition: A | Discharge status: Home / Self Care | Payer: 59 | Source: Ambulatory Visit | Attending: Gastroenterology | Admitting: Gastroenterology

## 2016-03-17 DIAGNOSIS — R1013 Epigastric pain: Secondary | ICD-10-CM

## 2016-03-17 MED ORDER — TECHNETIUM TC 99M MEBROFENIN IV KIT
5.3000 | PACK | Freq: Once | INTRAVENOUS | Status: AC | PRN
  Administered 2016-03-17: 5.3 via INTRAVENOUS

## 2016-08-11 ENCOUNTER — Other Ambulatory Visit: Payer: Self-pay | Admitting: Obstetrics and Gynecology

## 2016-08-11 DIAGNOSIS — Z1231 Encounter for screening mammogram for malignant neoplasm of breast: Secondary | ICD-10-CM

## 2016-09-13 ENCOUNTER — Ambulatory Visit
Admission: RE | Admit: 2016-09-13 | Discharge: 2016-09-13 | Disposition: A | Discharge status: Home / Self Care | Payer: 59 | Source: Ambulatory Visit | Attending: Obstetrics and Gynecology | Admitting: Obstetrics and Gynecology

## 2016-09-13 DIAGNOSIS — Z1231 Encounter for screening mammogram for malignant neoplasm of breast: Secondary | ICD-10-CM

## 2017-05-07 ENCOUNTER — Emergency Department (HOSPITAL_COMMUNITY): Payer: 59

## 2017-05-07 ENCOUNTER — Encounter (HOSPITAL_COMMUNITY): Payer: Self-pay

## 2017-05-07 ENCOUNTER — Emergency Department (HOSPITAL_COMMUNITY)
Admission: EM | Admit: 2017-05-07 | Discharge: 2017-05-07 | Disposition: A | Discharge status: Home / Self Care | Payer: 59 | Attending: Emergency Medicine | Admitting: Emergency Medicine

## 2017-05-07 DIAGNOSIS — I10 Essential (primary) hypertension: Secondary | ICD-10-CM | POA: Diagnosis not present

## 2017-05-07 DIAGNOSIS — I471 Supraventricular tachycardia: Secondary | ICD-10-CM | POA: Diagnosis not present

## 2017-05-07 DIAGNOSIS — R002 Palpitations: Secondary | ICD-10-CM | POA: Diagnosis present

## 2017-05-07 DIAGNOSIS — Z79899 Other long term (current) drug therapy: Secondary | ICD-10-CM | POA: Insufficient documentation

## 2017-05-07 DIAGNOSIS — R079 Chest pain, unspecified: Secondary | ICD-10-CM | POA: Insufficient documentation

## 2017-05-07 DIAGNOSIS — R0602 Shortness of breath: Secondary | ICD-10-CM | POA: Diagnosis not present

## 2017-05-07 LAB — BASIC METABOLIC PANEL
Anion gap: 11 (ref 5–15)
BUN: 14 mg/dL (ref 6–20)
CO2: 22 mmol/L (ref 22–32)
Calcium: 9.5 mg/dL (ref 8.9–10.3)
Chloride: 107 mmol/L (ref 101–111)
Creatinine, Ser: 1.24 mg/dL — ABNORMAL HIGH (ref 0.44–1.00)
GFR calc Af Amer: 54 mL/min — ABNORMAL LOW (ref >60)
GFR calc non Af Amer: 46 mL/min — ABNORMAL LOW (ref >60)
Glucose, Bld: 159 mg/dL — ABNORMAL HIGH (ref 65–99)
Potassium: 3.6 mmol/L (ref 3.5–5.1)
Sodium: 140 mmol/L (ref 135–145)

## 2017-05-07 LAB — CBC
HCT: 47.6 % — ABNORMAL HIGH (ref 36.0–46.0)
Hemoglobin: 16.4 g/dL — ABNORMAL HIGH (ref 12.0–15.0)
MCH: 30.3 pg (ref 26.0–34.0)
MCHC: 34.5 g/dL (ref 30.0–36.0)
MCV: 87.8 fL (ref 78.0–100.0)
Platelets: 237 10*3/uL (ref 150–400)
RBC: 5.42 MIL/uL — ABNORMAL HIGH (ref 3.87–5.11)
RDW: 14.1 % (ref 11.5–15.5)
WBC: 7.9 10*3/uL (ref 4.0–10.5)

## 2017-05-07 LAB — I-STAT TROPONIN, ED: Troponin i, poc: 0.02 ng/mL (ref 0.00–0.08)

## 2017-05-07 LAB — TSH: TSH: 0.606 u[IU]/mL (ref 0.350–4.500)

## 2017-05-07 MED ORDER — ADENOSINE 6 MG/2ML IV SOLN
6.0000 mg | Freq: Once | INTRAVENOUS | Status: AC
  Administered 2017-05-07: 6 mg via INTRAVENOUS
  Filled 2017-05-07: qty 2

## 2017-05-07 NOTE — ED Triage Notes (Signed)
Onset 10am chest pain, heart racing.  Pt has h/o of same.

## 2017-05-07 NOTE — ED Notes (Signed)
Patient transported to X-ray 

## 2017-05-07 NOTE — ED Provider Notes (Addendum)
[place] DEPT Provider Note   CSN: [id number] Arrival date & time: 05/07/17  1453     History   Chief Complaint Chief Complaint  Patient presents with  . Chest Pain  . Tachycardia    HPI Kathryn Gallegos is a 61 y.o. female.  HPI Patient has a history of SVT. Has seen Dr. Einar Gip for the same. At around 10:00 today began to feel her heart race. Slight chest pain with it. States she gets anxious when this happens. Has had some episodes in the past but they've always gone away. She is on Zoloft that she says helps control the episodes. His been doing well the last couple days. No fevers or chills. Her primary care doctor has checked a lot of blood work in the past per the patient. Past Medical History:  Diagnosis Date  . Anxiety   . Anxiety and depression   . Borderline diabetes   . HTN (hypertension)   . Seasonal allergies     Patient Active Problem List   Diagnosis Date Noted  . IMPINGEMENT SYNDROME 03/09/2010  . CARPAL TUNNEL SYNDROME 07/05/2007  . HIGH BLOOD PRESSURE 07/04/2007    Past Surgical History:  Procedure Laterality Date  . TOTAL ABDOMINAL HYSTERECTOMY    . TUBAL LIGATION      OB History    No data available       Home Medications    Prior to Admission medications   Medication Sig Start Date End Date Taking? Authorizing Provider  cyclobenzaprine (FLEXERIL) 10 MG tablet Take 1 tablet (10 mg total) by mouth every 8 (eight) hours. 05/03/13   [name], MD  gabapentin (NEURONTIN) 100 MG capsule Take 1 capsule (100 mg total) by mouth 3 (three) times daily. 05/13/11   [name], MD  HYDROcodone-acetaminophen (NORCO/VICODIN) 5-325 MG per tablet Take 1 tablet by mouth every 6 (six) hours as needed. 05/03/13   [name], MD  ibuprofen (ADVIL,MOTRIN) 400 MG tablet Take 400 mg by mouth every 6 (six) hours as needed.      [provider]  lisinopril (PRINIVIL,ZESTRIL) 10 MG tablet  04/09/11   [provider]    sertraline (ZOLOFT) 50 MG tablet  04/02/11   [provider]  temazepam (RESTORIL) 15 MG capsule Take 15 mg by mouth at bedtime as needed.      [provider]    Family History Family History  Problem Relation Age of Onset  . Pancreatic cancer Mother   . Esophageal cancer Maternal Grandmother   . Heart disease Maternal Grandfather   . Heart disease Maternal Uncle   . Colon cancer Maternal Aunt   . Stomach cancer Maternal Aunt   . Arthritis Unknown   . Cancer Unknown   . Diabetes Unknown     Social History Social History  Substance Use Topics  . Smoking status: Never Smoker  . Smokeless tobacco: Never Used  . Alcohol use No     Allergies   Amoxicillin   Review of Systems Review of Systems  Constitutional: Negative for appetite change and unexpected weight change.  HENT: Negative for congestion.   Respiratory: Positive for shortness of breath.   Cardiovascular: Positive for chest pain and palpitations. Negative for leg swelling.  Gastrointestinal: Negative for abdominal pain.  Genitourinary: Negative for dysuria.  Musculoskeletal: Negative for back pain.  Skin: Negative for rash.  Neurological: Negative for tremors and numbness.  Hematological: Negative for adenopathy.  Psychiatric/Behavioral: The patient is nervous/anxious.  Physical Exam Updated Vital Signs BP 127/75   Pulse (!) 103   Temp 98.4 F (36.9 C) (Oral)   Resp 19   SpO2 99%   Physical Exam  Constitutional: She appears well-developed.  HENT:  Head: Normocephalic.  Eyes: Pupils are equal, round, and reactive to light.  Neck: No thyromegaly present.  Cardiovascular:  Tachycardia  Pulmonary/Chest: Effort normal.  Abdominal: There is no tenderness.  Musculoskeletal: Normal range of motion. She exhibits no edema.  Neurological: She is alert.  Skin: Skin is warm. Capillary refill takes less than 2 seconds.  Psychiatric:  Patient appears somewhat anxious     ED  Treatments / Results  Labs (all labs ordered are listed, but only abnormal results are displayed) Labs Reviewed  CBC - Abnormal; Notable for the following:       Result Value   RBC 5.42 (*)    Hemoglobin 16.4 (*)    HCT 47.6 (*)    All other components within normal limits  BASIC METABOLIC PANEL  TSH  I-STAT TROPONIN, ED    EKG  EKG Interpretation  Date/Time:  Saturday May 07 2017 14:57:08 EDT Ventricular Rate:  172 PR Interval:    QRS Duration: 88 QT Interval:  282 QTC Calculation: 477 R Axis:   17 Text Interpretation:  Supraventricular tachycardia Marked ST abnormality, possible inferior subendocardial injury Abnormal ECG Confirmed by Davonna Belling [phone]) on 05/07/2017 3:14:53 PM       Radiology No results found.  Procedures Procedures (including critical care time)  Medications Ordered in ED Medications  adenosine (ADENOCARD) 6 MG/2ML injection 6 mg (6 mg Intravenous Given 05/07/17 1533)     Initial Impression / Assessment and Plan / ED Course  I have reviewed the triage vital signs and the nursing notes.  Pertinent labs & imaging results that were available during my care of the patient were reviewed by me and considered in my medical decision making (see chart for details).     Patient with SVT. Reportedly history of same. Converted with adenosine. Feels better. Labs pending likely. Will discharge home and follow-up with cardiology. Care turned over to [name].  Final Clinical Impressions(s) / ED Diagnoses   Final diagnoses:  SVT (supraventricular tachycardia) ([place])    New Prescriptions New Prescriptions   No medications on file     Davonna Belling, MD 05/07/17 [place], Zaki Gertsch, MD 05/07/17 [phone]

## 2017-06-06 ENCOUNTER — Encounter: Payer: Self-pay | Admitting: Internal Medicine

## 2017-06-06 ENCOUNTER — Ambulatory Visit (INDEPENDENT_AMBULATORY_CARE_PROVIDER_SITE_OTHER): Payer: 59 | Admitting: Internal Medicine

## 2017-06-06 ENCOUNTER — Encounter (INDEPENDENT_AMBULATORY_CARE_PROVIDER_SITE_OTHER): Payer: Self-pay

## 2017-06-06 VITALS — BP 152/82 | HR 95 | Ht 64.0 in | Wt 188.2 lb

## 2017-06-06 DIAGNOSIS — I471 Supraventricular tachycardia: Secondary | ICD-10-CM | POA: Diagnosis not present

## 2017-06-06 NOTE — Patient Instructions (Addendum)
Medication Instructions:  Your physician recommends that you continue on your current medications as directed. Please refer to the Current Medication list given to you today.  -- If you need a refill on your cardiac medications before your next appointment, please call your pharmacy. --  Labwork: None ordered  Testing/Procedures: None ordered  Follow-Up: Your physician wants you to follow-up as needed with Dr. Rayann Heman.    Thank you for choosing [facility]!!   [phone]  Any Other Special Instructions Will Be Listed Below (If Applicable).

## 2017-06-06 NOTE — Progress Notes (Signed)
Electrophysiology Office Note   Date:  06/06/2017   ID:  Kathryn Gallegos, DOB 1956-06-24, MRN [id number]  PCP:  Glendale Chard, MD  Cardiologist:  Dr Einar Gip Primary Electrophysiologist: Thompson Grayer, MD    Chief Complaint  Patient presents with  . New Patient (Initial Visit)    SVT     History of Present Illness: Kathryn Gallegos is a 61 y.o. female who presents today for electrophysiology evaluation.   She is referred by [name] and [name] for further evaluation of SVT.  She reports abrupt onset/offset of tachypalpitations since 2016.  Episodes typical begin and end without triggers/ precipitants.  More recently, she had sustained tachycardia 05/07/17 for which she required adenosine administration at [facility].  She has had no further episodes since that time.  She has been working diligently on stress reduction and lifestyle modification.  Today, she denies symptoms of palpitations, chest pain, shortness of breath, orthopnea, PND, lower extremity edema, claudication, dizziness, presyncope, syncope, bleeding, or neurologic sequela. The patient is tolerating medications without difficulties and is otherwise without complaint today.    Past Medical History:  Diagnosis Date  . Anxiety   . Anxiety and depression   . Asthma   . Borderline diabetes   . Carpal tunnel syndrome 07/05/2007   Qualifier: Diagnosis of  By: [name], [name]    . HIGH BLOOD PRESSURE 07/04/2007   Qualifier: Diagnosis of  By: [name], [name]    . HTN (hypertension)   . Hyperglycemia   . Hyperlipidemia   . IMPINGEMENT SYNDROME 03/09/2010   Qualifier: Diagnosis of  By: [name], [name]    . Obesity   . Rapid palpitations   . Seasonal allergies    Past Surgical History:  Procedure Laterality Date  . TOTAL ABDOMINAL HYSTERECTOMY    . TUBAL LIGATION       Current Outpatient Prescriptions  Medication Sig Dispense Refill  . albuterol (PROVENTIL HFA;VENTOLIN HFA) 108 (90 Base)  MCG/ACT inhaler Inhale 1 puff into the lungs every 6 (six) hours as needed for wheezing or shortness of breath.    . diltiazem (CARDIZEM) 30 MG tablet Take 30 mg by mouth as needed (palpitations). Max 4 tablets with 24 hours    . esomeprazole (NEXIUM) 20 MG capsule TAKE 1 TABLET BY MOUTH EVERY OTHER DAY     . ibuprofen (ADVIL,MOTRIN) 400 MG tablet Take 400 mg by mouth every 6 (six) hours as needed (pain).     Marland Kitchen levocetirizine (XYZAL) 5 MG tablet Take 5 mg by mouth every evening.    . Probiotic Product (PROBIOTIC DAILY PO) Take 1 capsule by mouth daily as needed (as directed).     . sertraline (ZOLOFT) 50 MG tablet Take 50 mg by mouth daily.      Current Facility-Administered Medications  Medication Dose Route Frequency Provider Last Rate Last Dose  . methylPREDNISolone acetate (DEPO-MEDROL) injection 40 mg  40 mg Intra-articular Once [name], MD        Allergies:   Amoxicillin   Social History:  The patient  reports that she has quit smoking. Her smoking use included Cigarettes. She has never used smokeless tobacco. She reports that she does not drink alcohol.   Family History:  The patient's  family history includes Arthritis in her unknown relative; Cancer in her unknown relative; Colon cancer in her maternal aunt; Diabetes in her unknown relative; Esophageal cancer in her maternal grandmother; Heart disease in her maternal grandfather and maternal uncle;  Pancreatic cancer in her mother; Stomach cancer in her maternal aunt.    ROS:  Please see the history of present illness.   All other systems are personally reviewed and negative.    PHYSICAL EXAM: VS:  BP (!) 152/82   Pulse 95   Ht 5\' 4"  (1.626 m)   Wt 188 lb 3.2 oz (85.4 kg)   SpO2 99%   BMI 32.30 kg/m  , BMI Body mass index is 32.3 kg/m. GEN: Well nourished, well developed, in no acute distress  HEENT: normal  Neck: no JVD, carotid bruits, or masses Cardiac: RRR; no murmurs, rubs, or gallops,no edema    Respiratory:  clear to auscultation bilaterally, normal work of breathing GI: soft, nontender, nondistended, + BS MS: no deformity or atrophy  Skin: warm and dry  Neuro:  Strength and sensation are intact Psych: euthymic mood, full affect  EKG:  05/07/17 reveals short RP SVT at 172 bpm.  There is a pseudo R prime in V1 and pseudo S wave in II   Recent Labs: 05/07/2017: BUN 14; Creatinine, Ser 1.24; Hemoglobin 16.4; Platelets 237; Potassium 3.6; Sodium 140; TSH 0.606  personally reviewed   Lipid Panel  No results found for: CHOL, TRIG, HDL, CHOLHDL, VLDL, LDLCALC, LDLDIRECT personally reviewed   Wt Readings from Last 3 Encounters:  06/06/17 188 lb 3.2 oz (85.4 kg)  05/03/13 181 lb (82.1 kg)  07/26/12 181 lb (82.1 kg)      Other studies personally reviewed: Additional studies/ records that were reviewed today include: Dr Irven Shelling office notes  Review of the above records today demonstrates: as above   ASSESSMENT AND PLAN:  1.  SVT The patient has symptomatic recurrent adenosine sensitive short RP SVT. Therapeutic strategies for supraventricular tachycardia including medicine and ablation were discussed in detail with the patient today. Risk, benefits, and alternatives to EP study and radiofrequency ablation were also discussed in detail today. These risks include but are not limited to stroke, bleeding, vascular damage, tamponade, perforation, damage to the heart and other structures, AV block requiring pacemaker, worsening renal function, and death. The patient understands these risk and wishes to avoid ablation at this time.  Though I have strongly encouraged her to proceed, she wishes to continue to try lifestyle modification including stress avoidance at this time.  She will follow-up with Dr Einar Gip and I will see as needed. She is aware that we can proceed with ablation at any time.    Current medicines are reviewed at length with the patient today.   The patient does not  have concerns regarding her medicines.  The following changes were made today:  none    Signed, Thompson Grayer, MD  06/06/2017 3:37 PM     [address] [place] [id number] [phone] (office) [phone] (fax)

## 2017-06-17 ENCOUNTER — Emergency Department (HOSPITAL_COMMUNITY): Payer: 59

## 2017-06-17 ENCOUNTER — Encounter (HOSPITAL_COMMUNITY): Payer: Self-pay

## 2017-06-17 ENCOUNTER — Emergency Department (HOSPITAL_COMMUNITY)
Admission: EM | Admit: 2017-06-17 | Discharge: 2017-06-17 | Disposition: A | Discharge status: Home / Self Care | Payer: 59 | Attending: Emergency Medicine | Admitting: Emergency Medicine

## 2017-06-17 DIAGNOSIS — M25512 Pain in left shoulder: Secondary | ICD-10-CM | POA: Insufficient documentation

## 2017-06-17 DIAGNOSIS — S0181XA Laceration without foreign body of other part of head, initial encounter: Secondary | ICD-10-CM

## 2017-06-17 DIAGNOSIS — S0993XA Unspecified injury of face, initial encounter: Secondary | ICD-10-CM | POA: Diagnosis present

## 2017-06-17 DIAGNOSIS — Y9302 Activity, running: Secondary | ICD-10-CM | POA: Diagnosis not present

## 2017-06-17 DIAGNOSIS — W01198A Fall on same level from slipping, tripping and stumbling with subsequent striking against other object, initial encounter: Secondary | ICD-10-CM | POA: Insufficient documentation

## 2017-06-17 DIAGNOSIS — Z79899 Other long term (current) drug therapy: Secondary | ICD-10-CM | POA: Insufficient documentation

## 2017-06-17 DIAGNOSIS — Y92480 Sidewalk as the place of occurrence of the external cause: Secondary | ICD-10-CM | POA: Diagnosis not present

## 2017-06-17 DIAGNOSIS — S01112A Laceration without foreign body of left eyelid and periocular area, initial encounter: Secondary | ICD-10-CM | POA: Diagnosis not present

## 2017-06-17 DIAGNOSIS — Y999 Unspecified external cause status: Secondary | ICD-10-CM | POA: Diagnosis not present

## 2017-06-17 DIAGNOSIS — Z87891 Personal history of nicotine dependence: Secondary | ICD-10-CM | POA: Insufficient documentation

## 2017-06-17 DIAGNOSIS — J45909 Unspecified asthma, uncomplicated: Secondary | ICD-10-CM | POA: Insufficient documentation

## 2017-06-17 DIAGNOSIS — Y93K1 Activity, walking an animal: Secondary | ICD-10-CM | POA: Insufficient documentation

## 2017-06-17 DIAGNOSIS — S42255A Nondisplaced fracture of greater tuberosity of left humerus, initial encounter for closed fracture: Secondary | ICD-10-CM | POA: Insufficient documentation

## 2017-06-17 MED ORDER — CYCLOBENZAPRINE HCL 5 MG PO TABS: 10.0000 mg | ORAL_TABLET | Freq: Two times a day (BID) | ORAL | 0 refills | Status: DC | PRN

## 2017-06-17 MED ORDER — IBUPROFEN 800 MG PO TABS: 800.0000 mg | ORAL_TABLET | Freq: Three times a day (TID) | ORAL | 0 refills | Status: DC

## 2017-06-17 MED ORDER — MUPIROCIN CALCIUM 2 % EX CREA: 1.0000 "application " | TOPICAL_CREAM | Freq: Two times a day (BID) | CUTANEOUS | 0 refills | Status: DC

## 2017-06-17 MED ORDER — LIDOCAINE-EPINEPHRINE (PF) 2 %-1:200000 IJ SOLN
10.0000 mL | Freq: Once | INTRAMUSCULAR | Status: AC
  Administered 2017-06-17: 10 mL
  Filled 2017-06-17: qty 20

## 2017-06-17 MED ORDER — IBUPROFEN 800 MG PO TABS
800.0000 mg | ORAL_TABLET | Freq: Once | ORAL | Status: AC
  Administered 2017-06-17: 800 mg via ORAL
  Filled 2017-06-17: qty 1

## 2017-06-17 MED ORDER — HYDROCODONE-ACETAMINOPHEN 5-325 MG PO TABS: 1.0000 | ORAL_TABLET | Freq: Four times a day (QID) | ORAL | 0 refills | Status: DC | PRN

## 2017-06-17 NOTE — ED Notes (Signed)
Patient verbalized understanding of discharge instructions and denies any further needs or questions at this time. VS stable. Patient ambulatory with steady gait.  

## 2017-06-17 NOTE — ED Notes (Signed)
Ortho tech aware of shoulder immobilizer order.

## 2017-06-17 NOTE — ED Notes (Addendum)
Patient has vertical laceration approx. 2.5 cm long above L eyebrow following fall while running. Bleeding currently controlled. Suture cart at bedside. Pt denies vision problems, no N/V, no headache. Also c/o bilateral knee pain and L shoulder pain with movement.

## 2017-06-17 NOTE — ED Provider Notes (Signed)
[facility]ER[facility]ENC[facility] DEPARTMENT Provider Note   CSN: [id number] Arrival date & time: 06/17/17  1400     History   Chief Complaint Chief Complaint  Patient presents with  . Facial Laceration    HPI Kathryn Gallegos is a 61 y.o. female presenting with pain and head laceration after a fall.  Patient states he was walking her dog when the neighbors dog started chasing her.  She was concerned they were going to attack her, so she started running, and tripped and fell forward.  She hit the left side of her body.  She reports acute onset pain of her forehead where she developed the laceration.  Additionally she endorses left shoulder pain, bilateral elbow pain, bilateral knee pain.  Movement makes her pain worse, nothing has made it better.  She was ambulatory after the fall.  She denies numbness or tingling.  She denies loss of bowel or bladder control.  She denies headache, neck pain, or back pain.  She has not taken anything for pain.  She denies dizziness, loss of consciousness, she is not on blood thinners.  She denies vision changes, slurred speech, nausea, vomiting, chest pain, shortness of breath, or abdominal pain.  Last tetanus shot was in 2016.  She was not bitten by the dog, and has no injuries due to dog teeth or nails.  HPI  Past Medical History:  Diagnosis Date  . Anxiety   . Anxiety and depression   . Asthma   . Borderline diabetes   . Carpal tunnel syndrome 07/05/2007   Qualifier: Diagnosis of  By: [name], [name]    . HIGH BLOOD PRESSURE 07/04/2007   Qualifier: Diagnosis of  By: [name], [name]    . HTN (hypertension)   . Hyperglycemia   . Hyperlipidemia   . IMPINGEMENT SYNDROME 03/09/2010   Qualifier: Diagnosis of  By: [name], [name]    . Obesity   . Rapid palpitations   . Seasonal allergies     Patient Active Problem List   Diagnosis Date Noted  . IMPINGEMENT SYNDROME 03/09/2010  . CARPAL TUNNEL SYNDROME 07/05/2007  .  HIGH BLOOD PRESSURE 07/04/2007    Past Surgical History:  Procedure Laterality Date  . TOTAL ABDOMINAL HYSTERECTOMY    . TUBAL LIGATION      OB History    No data available       Home Medications    Prior to Admission medications   Medication Sig Start Date End Date Taking? Authorizing Provider  albuterol (PROVENTIL HFA;VENTOLIN HFA) 108 (90 Base) MCG/ACT inhaler Inhale 1 puff into the lungs every 6 (six) hours as needed for wheezing or shortness of breath.    [provider]  cyclobenzaprine (FLEXERIL) 5 MG tablet Take 2 tablets (10 mg total) by mouth 2 (two) times daily as needed for muscle spasms. 06/17/17   Vinnie Gombert, PA-C  diltiazem (CARDIZEM) 30 MG tablet Take 30 mg by mouth as needed (palpitations). Max 4 tablets with 24 hours    [provider]  esomeprazole (NEXIUM) 20 MG capsule TAKE 1 TABLET BY MOUTH EVERY OTHER DAY     [provider]  HYDROcodone-acetaminophen (NORCO/VICODIN) 5-325 MG tablet Take 1 tablet by mouth every 6 (six) hours as needed for severe pain. 06/17/17   Zehava Turski, PA-C  ibuprofen (ADVIL,MOTRIN) 400 MG tablet Take 400 mg by mouth every 6 (six) hours as needed (pain).     [provider]  ibuprofen (ADVIL,MOTRIN) 800 MG tablet Take  1 tablet (800 mg total) by mouth 3 (three) times daily with meals. 06/17/17   Shenice Dolder, PA-C  levocetirizine (XYZAL) 5 MG tablet Take 5 mg by mouth every evening.    [provider]  mupirocin cream (BACTROBAN) 2 % Apply 1 application topically 2 (two) times daily. 06/17/17   Margeret Stachnik, PA-C  Probiotic Product (PROBIOTIC DAILY PO) Take 1 capsule by mouth daily as needed (as directed).     [provider]  sertraline (ZOLOFT) 50 MG tablet Take 50 mg by mouth daily.  04/02/11   [provider]    Family History Family History  Problem Relation Age of Onset  . Pancreatic cancer Mother   . Esophageal cancer Maternal Grandmother   . Heart  disease Maternal Grandfather   . Heart disease Maternal Uncle   . Colon cancer Maternal Aunt   . Stomach cancer Maternal Aunt   . Arthritis Unknown   . Cancer Unknown   . Diabetes Unknown     Social History Social History  Substance Use Topics  . Smoking status: Former Smoker    Types: Cigarettes  . Smokeless tobacco: Never Used     Comment: STRT AGE 34 QUIT AGE 41 OR 21  . Alcohol use No     Allergies   Amoxicillin   Review of Systems Review of Systems  Musculoskeletal: Positive for arthralgias. Negative for back pain and neck pain.  Skin: Positive for wound.  Neurological: Negative for dizziness and headaches.  Hematological: Does not bruise/bleed easily.     Physical Exam Updated Vital Signs BP (!) 147/97 (BP Location: Right Arm)   Pulse 88   Temp 98.1 F (36.7 C) (Oral)   Resp 18   Ht 5\' 4"  (1.626 m)   Wt 85.3 kg (188 lb)   SpO2 100%   BMI 32.27 kg/m   Physical Exam  Constitutional: She is oriented to person, place, and time. She appears well-developed and well-nourished. No distress.  HENT:  Head: Normocephalic.  Nose: Nose normal.  Mouth/Throat: Uvula is midline, oropharynx is clear and moist and mucous membranes are normal.  No malocclusion.  Laceration above left eyebrow.  No surrounding hematoma.  No other injury or hematoma noted on the scalp.  Eyes: Pupils are equal, round, and reactive to light. EOM are normal.  Neck: Normal range of motion.  Full ROM of head and neck without pain.  No tenderness to palpation of midline cervical spine.  Cardiovascular: Normal rate, regular rhythm and intact distal pulses.   Pulmonary/Chest: Effort normal and breath sounds normal. She exhibits no tenderness.  Abdominal: Soft. She exhibits no distension. There is no tenderness. There is no guarding.  Musculoskeletal: She exhibits tenderness.  No tenderness to palpation of neck or midline spine.  No tenderness to palpation of the back.  Tenderness to palpation  bilateral anterior knees.  No tenderness to palpation of left shoulder.  No obvious deformity, contusion, or laceration noted.  Radial pulses intact bilaterally.  Pedal pulses intact related.  Strength of upper and lower extremities equal bilaterally.  Sensation intact x4.  Patient is ambulatory.    Neurological: She is alert and oriented to person, place, and time. She has normal strength. No cranial nerve deficit or sensory deficit. GCS eye subscore is 4. GCS verbal subscore is 5. GCS motor subscore is 6.  Skin: Skin is warm.  Psychiatric: She has a normal mood and affect.  Nursing note and vitals reviewed.    ED Treatments / Results  Labs (all labs ordered are listed, but only abnormal results are displayed) Labs Reviewed - No data to display  EKG  EKG Interpretation None       Radiology Dg Shoulder Left  Result Date: 06/17/2017 CLINICAL DATA:  61 year old female with fall and left shoulder pain. EXAM: LEFT SHOULDER - 2+ VIEW COMPARISON:  None. FINDINGS: There is a comminuted appearing fracture of the left humeral head and neck. There is a mildly displaced fracture component involving the greater tubercle. A nondisplaced linear lucency through the neck of the left humerus is concerning for a nondisplaced transverse fracture. CT or MRI may provide better evaluation if clinically indicated. The bones are osteopenic. There is no dislocation. The soft tissues are grossly unremarkable. IMPRESSION: Mildly displaced fracture of the greater tubercle of the humerus as well as an apparent nondisplaced transverse fracture of the humeral neck. No dislocation. Electronically Signed   By: Anner Crete M.D.   On: 06/17/2017 18:46   Dg Knee Complete 4 Views Left  Result Date: 06/17/2017 CLINICAL DATA:  61 year old female with fall and left knee pain. EXAM: LEFT KNEE - COMPLETE 4+ VIEW COMPARISON:  None. FINDINGS: There is no acute fracture or dislocation. There is mild osteoarthritic changes. There  is irregularity of the posterior patella, likely chondromalacia patella. Trace suprapatellar effusion may be present. The soft tissues appear unremarkable. IMPRESSION: No acute fracture or dislocation. Degenerative changes. Electronically Signed   By: Anner Crete M.D.   On: 06/17/2017 18:44   Dg Knee Complete 4 Views Right  Result Date: 06/17/2017 CLINICAL DATA:  Patient status post fall. Knee and shoulder pain. Initial encounter. EXAM: RIGHT KNEE - COMPLETE 4+ VIEW COMPARISON:  None. FINDINGS: Normal anatomic alignment. No evidence for acute fracture or dislocation. Mild tricompartmental degenerative changes. Soft tissues unremarkable. IMPRESSION: No acute osseous abnormality. Electronically Signed   By: Lovey Newcomer M.D.   On: 06/17/2017 18:42    Procedures .Marland KitchenLaceration Repair Date/Time: 06/17/2017 5:59 PM Performed by: Franchot Heidelberg Authorized by: Franchot Heidelberg   Consent:    Consent obtained:  Verbal   Consent given by:  Patient   Risks discussed:  Pain, poor cosmetic result, poor wound healing and infection Anesthesia (see MAR for exact dosages):    Anesthesia method:  Local infiltration   Local anesthetic:  Lidocaine 2% WITH epi Laceration details:    Location:  Face   Face location:  L eyebrow   Length (cm):  3   Depth (mm):  1.5 Repair type:    Repair type:  Simple Pre-procedure details:    Preparation:  Patient was prepped and draped in usual sterile fashion Exploration:    Wound exploration: wound explored through full range of motion and entire depth of wound probed and visualized     Wound extent: no muscle damage noted, no nerve damage noted and no tendon damage noted     Contaminated: no   Treatment:    Area cleansed with:  Betadine   Amount of cleaning:  Standard Skin repair:    Repair method:  Sutures   Suture size:  7-0   Suture material:  Prolene   Suture technique:  Simple interrupted   Number of sutures:  5 Approximation:    Approximation:   Close Post-procedure details:    Dressing:  Sterile dressing   Patient tolerance of procedure:  Tolerated well, no immediate complications   (including critical care time)  Medications Ordered in ED Medications  lidocaine-EPINEPHrine (XYLOCAINE W/EPI) 2 %-1:200000 (PF) injection 10  mL (10 mLs Infiltration Given 06/17/17 1655)  ibuprofen (ADVIL,MOTRIN) tablet 800 mg (800 mg Oral Given 06/17/17 1654)     Initial Impression / Assessment and Plan / ED Course  I have reviewed the triage vital signs and the nursing notes.  Pertinent labs & imaging results that were available during my care of the patient were reviewed by me and considered in my medical decision making (see chart for details).     Presenting with right shoulder pain, bilateral knee pain, bilateral elbow pain and a head lack after falling.  Physical exam shows patient is neurovascularly intact.  No neurologic deficits.  At this time, doubt intracranial injury or need for CT scan.  She was observed in the ER for a total of 5 hours without neurologic symptoms.  No neck or back pain.  Will obtain x-rays of bilateral knees and shoulder for further evaluation.  Knees and elbows with superficial abrasions without bleeding or drainage.  Head lac cleaned and repaired.  Aftercare instructions given.  Patient to return in 7 days for suture removal.  Shoulder x-ray shows mildly displaced greater tubercle fracture with possible fracture through the humeral head.  Discussed with attending, Dr. Gilford Raid reviewed the images.  Patient be placed in shoulder immobilizer and follow-up with Ortho. Knee X-rays show no acute abnormalities but chronic degenerative changes.  Abrasions dressed with and antibiotic ointment applied.  Will discharge patient with conservative measures including anti-inflammatories and muscle relaxer.  Bactroban for abrasions.  Hydrocodone as needed for breakthrough pain.  Patient to follow-up with Ortho.   Discussed returning in 7  days for suture removal, and this can be done at primary care, urgent care or the ER.  At this time, patient appears safe for discharge.  Return precautions given.  Patient states she understands and agrees to plan.   Final Clinical Impressions(s) / ED Diagnoses   Final diagnoses:  Left shoulder pain  Facial laceration, initial encounter  Closed nondisplaced fracture of greater tuberosity of left humerus, initial encounter    New Prescriptions New Prescriptions   CYCLOBENZAPRINE (FLEXERIL) 5 MG TABLET    Take 2 tablets (10 mg total) by mouth 2 (two) times daily as needed for muscle spasms.   HYDROCODONE-ACETAMINOPHEN (NORCO/VICODIN) 5-325 MG TABLET    Take 1 tablet by mouth every 6 (six) hours as needed for severe pain.   IBUPROFEN (ADVIL,MOTRIN) 800 MG TABLET    Take 1 tablet (800 mg total) by mouth 3 (three) times daily with meals.   MUPIROCIN CREAM (BACTROBAN) 2 %    Apply 1 application topically 2 (two) times daily.     Franchot Heidelberg, PA-C 06/17/17 1909    Isla Pence, MD 06/17/17 Kathyrn Drown

## 2017-06-17 NOTE — ED Triage Notes (Signed)
Pt endorses running from a dog and she slipped and fell and hit her head, pt has 2 inch laceration to left  Forehead, Denies loc or dizziness. Pt got back up and continued running. Complains of bilateral arm pain and knee pain after fall. VSS.

## 2017-06-17 NOTE — Discharge Instructions (Signed)
1. Medications: ibuprofen for pain. Flexeril as needed for muscle stiffness/soreness. Use bactroban on your scrapes of your elbows and knees. Use hydrocodone as needed for breakthrough pain. 2. Treatment: ice for swelling, keep wound clean with warm soap and water and keep bandage dry, do not submerge in water for 24 hours 3. Follow Up: Please return in 7 days to have your stitches removed, or sooner if you have concerns. Return to the emergency department for increased redness, drainage of pus from the wound   WOUND CARE  Keep area clean and dry for 24 hours. Do not remove bandage.  After 24 hours, remove bandage and wash wound gently with mild soap and warm water. Reapply a new bandage after cleaning wound, if directed.   Continue daily cleansing with soap and water until stitches are removed.  Do not apply any ointments or creams to the wound while stitches are in place, as this may cause delayed healing. Return if you experience any of the following signs of infection: Swelling, redness, pus drainage, streaking, fever >101.0 F  Return if you experience excessive bleeding that does not stop after 15-20 minutes of constant, firm pressure.

## 2017-06-17 NOTE — ED Notes (Signed)
Pt transported to xray 

## 2017-06-17 NOTE — ED Notes (Signed)
EDP at bedside, suturing patient's wound.  

## 2017-06-17 NOTE — Progress Notes (Signed)
Orthopedic Tech Progress Note Patient Details:  Kathryn Gallegos [birth year] [id number]  Ortho Devices Type of Ortho Device: Shoulder immobilizer Ortho Device/Splint Location: LUE Ortho Device/Splint Interventions: Ordered, Application   Braulio Bosch 06/17/2017, 7:48 PM

## 2017-06-22 ENCOUNTER — Encounter: Payer: Self-pay | Admitting: Orthopedic Surgery

## 2017-06-22 ENCOUNTER — Ambulatory Visit (INDEPENDENT_AMBULATORY_CARE_PROVIDER_SITE_OTHER): Payer: 59 | Admitting: Orthopedic Surgery

## 2017-06-22 VITALS — BP 159/108 | HR 108 | Ht 64.0 in | Wt 184.0 lb

## 2017-06-22 DIAGNOSIS — S42295A Other nondisplaced fracture of upper end of left humerus, initial encounter for closed fracture: Secondary | ICD-10-CM | POA: Diagnosis not present

## 2017-06-22 MED ORDER — HYDROCODONE-ACETAMINOPHEN 5-325 MG PO TABS: 1.0000 | ORAL_TABLET | Freq: Four times a day (QID) | ORAL | 0 refills | Status: DC | PRN

## 2017-06-22 MED ORDER — IBUPROFEN 800 MG PO TABS: 800.0000 mg | ORAL_TABLET | Freq: Three times a day (TID) | ORAL | 1 refills | Status: DC

## 2017-06-22 NOTE — Patient Instructions (Signed)
Wear sling okay to remove for bathing try not to move the arm away from the body  Return to work on the 19th

## 2017-06-22 NOTE — Progress Notes (Signed)
NEW PATIENT OFFICE VISIT    Chief Complaint  Patient presents with  . Shoulder Pain    left / humeral fracture DOI 06/17/17    61 year old female complains of 5-day history of left shoulder pain  She also sustained a and had stitches placed which will be taken out on Friday  As far as her shoulder goes she complains of mild to moderate discomfort with dull constant pain over the left proximal humerus with abrasion under the left proximal forearm  She says some dogs were chasing her and caused her to fall    Review of Systems  Constitutional: Negative for chills and fever.  Neurological: Negative for tingling.     Past Medical History:  Diagnosis Date  . Anxiety   . Anxiety and depression   . Asthma   . Borderline diabetes   . Carpal tunnel syndrome 07/05/2007   Qualifier: Diagnosis of  By: [name], [name]    . HIGH BLOOD PRESSURE 07/04/2007   Qualifier: Diagnosis of  By: [name], [name]    . HTN (hypertension)   . Hyperglycemia   . Hyperlipidemia   . IMPINGEMENT SYNDROME 03/09/2010   Qualifier: Diagnosis of  By: [name], [name]    . Obesity   . Rapid palpitations   . Seasonal allergies     Past Surgical History:  Procedure Laterality Date  . TOTAL ABDOMINAL HYSTERECTOMY    . TUBAL LIGATION      Family History  Problem Relation Age of Onset  . Pancreatic cancer Mother   . Esophageal cancer Maternal Grandmother   . Heart disease Maternal Grandfather   . Heart disease Maternal Uncle   . Colon cancer Maternal Aunt   . Stomach cancer Maternal Aunt   . Arthritis Unknown   . Cancer Unknown   . Diabetes Unknown    Social History   Tobacco Use  . Smoking status: Former Smoker    Types: Cigarettes  . Smokeless tobacco: Never Used  . Tobacco comment: STRT AGE 59 QUIT AGE 58 OR 21  Substance Use Topics  . Alcohol use: No  . Drug use: No    Current Meds  Medication Sig  . albuterol (PROVENTIL HFA;VENTOLIN HFA) 108 (90 Base) MCG/ACT  inhaler Inhale 1 puff into the lungs every 6 (six) hours as needed for wheezing or shortness of breath.  . cyclobenzaprine (FLEXERIL) 5 MG tablet Take 2 tablets (10 mg total) by mouth 2 (two) times daily as needed for muscle spasms.  Marland Kitchen diltiazem (CARDIZEM) 30 MG tablet Take 30 mg by mouth as needed (palpitations). Max 4 tablets with 24 hours  . esomeprazole (NEXIUM) 20 MG capsule TAKE 1 TABLET BY MOUTH EVERY OTHER DAY   . HYDROcodone-acetaminophen (NORCO/VICODIN) 5-325 MG tablet Take 1 tablet by mouth every 6 (six) hours as needed for severe pain.  Marland Kitchen ibuprofen (ADVIL,MOTRIN) 800 MG tablet Take 1 tablet (800 mg total) by mouth 3 (three) times daily with meals.  Marland Kitchen levocetirizine (XYZAL) 5 MG tablet Take 5 mg by mouth every evening.  . mupirocin cream (BACTROBAN) 2 % Apply 1 application topically 2 (two) times daily.  . Probiotic Product (PROBIOTIC DAILY PO) Take 1 capsule by mouth daily as needed (as directed).   . sertraline (ZOLOFT) 50 MG tablet Take 50 mg by mouth daily.    Current Facility-Administered Medications for the 06/22/17 encounter (Office Visit) with [name], MD  Medication  . methylPREDNISolone acetate (DEPO-MEDROL) injection 40 mg    BP (!) 159/108  Pulse (!) 108   Ht 5\' 4"  (1.626 m)   Wt 184 lb (83.5 kg)   BMI 31.58 kg/m   Physical Exam  Constitutional: She is oriented to person, place, and time. She appears well-developed and well-nourished. No distress.  HENT:  Left forehead laceration  Neurological: She is alert and oriented to person, place, and time. Gait normal.  Skin: Capillary refill takes less than 2 seconds. She is not diaphoretic.  Psychiatric: She has a normal mood and affect. Her behavior is normal. Judgment and thought content normal.    Right Shoulder Exam   Other  Sensation: normal Pulse: present  Comments:  Right upper extremity inspection normal full range of motion no instability normal motor exam skin intact   Left Shoulder Exam    Tenderness  The patient is experiencing tenderness in the acromion (Proximal humerus).  Range of Motion  Active abduction: abnormal  Passive abduction: abnormal  Extension: abnormal  External rotation: abnormal  Forward flexion: abnormal   Muscle Strength  Left shoulder normal muscle strength: Normal muscle tone left upper extremity.  Other  Sensation: normal Pulse: present   Comments:  Stability test could not be done because of the fracture but the x-ray shows a reduced shoulder with axillary views, there is an abrasion under the left forearm      No orders of the defined types were placed in this encounter.  I have 3 views left shoulder dated June 17, 2017 axillary view show shoulder reduced no apex anterior posterior angulation  There is a proximal humerus fracture which is transverse at the humeral neck there is a large greater tuberosity fragment which is nondisplaced, the greater tuberosity does not appear to be externally rotated   Encounter Diagnosis  Name Primary?  . Other closed nondisplaced fracture of proximal end of left humerus, initial encounter Yes     PLAN:   Nonoperative treatment in this right-hand-dominant female should be out of work through the 19th with a return to work on the 19th  Weekly x-rays x2 and then physical therapy for 6-8 weeks

## 2017-06-24 ENCOUNTER — Encounter (HOSPITAL_COMMUNITY): Payer: Self-pay | Admitting: *Deleted

## 2017-06-24 ENCOUNTER — Emergency Department (HOSPITAL_COMMUNITY)
Admission: EM | Admit: 2017-06-24 | Discharge: 2017-06-24 | Disposition: A | Discharge status: Home / Self Care | Payer: 59 | Attending: Emergency Medicine | Admitting: Emergency Medicine

## 2017-06-24 ENCOUNTER — Other Ambulatory Visit: Payer: Self-pay

## 2017-06-24 DIAGNOSIS — Y33XXXA Other specified events, undetermined intent, initial encounter: Secondary | ICD-10-CM | POA: Diagnosis not present

## 2017-06-24 DIAGNOSIS — J45909 Unspecified asthma, uncomplicated: Secondary | ICD-10-CM | POA: Diagnosis not present

## 2017-06-24 DIAGNOSIS — Z79899 Other long term (current) drug therapy: Secondary | ICD-10-CM | POA: Diagnosis not present

## 2017-06-24 DIAGNOSIS — E785 Hyperlipidemia, unspecified: Secondary | ICD-10-CM | POA: Insufficient documentation

## 2017-06-24 DIAGNOSIS — I10 Essential (primary) hypertension: Secondary | ICD-10-CM | POA: Insufficient documentation

## 2017-06-24 DIAGNOSIS — Z87891 Personal history of nicotine dependence: Secondary | ICD-10-CM | POA: Insufficient documentation

## 2017-06-24 DIAGNOSIS — Z4802 Encounter for removal of sutures: Secondary | ICD-10-CM

## 2017-06-24 DIAGNOSIS — S0181XD Laceration without foreign body of other part of head, subsequent encounter: Secondary | ICD-10-CM | POA: Diagnosis present

## 2017-06-24 NOTE — Discharge Instructions (Signed)
Please see the information and instructions below regarding your visit.  Your diagnoses today include:  1. Visit for suture removal    Your suture site look good today.  5 sutures were removed.  Tests performed today include: See side panel of your discharge paperwork for testing performed today. Vital signs are listed at the bottom of these instructions.   Medications prescribed:    Take any prescribed medications only as prescribed, and any over the counter medications only as directed on the packaging.  Home care instructions:  Please follow any educational materials contained in this packet.   You may cleanse the site of suture removal with warm soap and water.  You may apply Mederma or vitamin E oil for cosmetic outcome.  Follow-up instructions: Please follow-up with your primary care provider as needed for further evaluation of your symptoms if they are not completely improved.   Return instructions:  Please return to the Emergency Department if you experience worsening symptoms.  Please return the emergency department if you notice any redness or swelling around the site of your wound or if you noticed any abnormal drainage such as pus. Please return if you have any other emergent concerns.  Additional Information:   Your vital signs today were: BP (!) 161/101 (BP Location: Right Arm)    Pulse 77    Temp 97.6 F (36.4 C) (Oral)    Resp 18    Ht 5\' 4"  (1.626 m)    Wt 83.5 kg (184 lb)    SpO2 99%    BMI 31.58 kg/m  If your blood pressure (BP) was elevated on multiple readings during this visit above 130 for the top number or above 80 for the bottom number, please have this repeated by your primary care provider within one month. --------------  Thank you for allowing [place] to participate in your care today.

## 2017-06-24 NOTE — ED Triage Notes (Signed)
The pt is here for a suture removal to the rt forehead  She had suture placed last Friday  No problems so far no pain  Some itching

## 2017-06-24 NOTE — ED Provider Notes (Signed)
[facility] EMERGENCY DEPARTMENT Provider Note   CSN: [id number] Arrival date & time: 06/24/17  1545     History   Chief Complaint Chief Complaint  Patient presents with  . Wound Check    HPI Kathryn Gallegos is a 60 y.o. female.  HPI   Patient is a 28-year-old female presenting 1 week after laceration to the left forehead superior to the eye for suture removal.  Patient reports no complaints.  Patient denies any redness or swelling from the site over the past week.  Patient has had itching but no pain.  Patient has followed all care instructions including bandaging and frequent cleansing.  Past Medical History:  Diagnosis Date  . Anxiety   . Anxiety and depression   . Asthma   . Borderline diabetes   . Carpal tunnel syndrome 07/05/2007   Qualifier: Diagnosis of  By: [name], [name]    . HIGH BLOOD PRESSURE 07/04/2007   Qualifier: Diagnosis of  By: [name], [name]    . HTN (hypertension)   . Hyperglycemia   . Hyperlipidemia   . IMPINGEMENT SYNDROME 03/09/2010   Qualifier: Diagnosis of  By: [name], [name]    . Obesity   . Rapid palpitations   . Seasonal allergies     Patient Active Problem List   Diagnosis Date Noted  . IMPINGEMENT SYNDROME 03/09/2010  . CARPAL TUNNEL SYNDROME 07/05/2007  . HIGH BLOOD PRESSURE 07/04/2007    Past Surgical History:  Procedure Laterality Date  . TOTAL ABDOMINAL HYSTERECTOMY    . TUBAL LIGATION      OB History    No data available       Home Medications    Prior to Admission medications   Medication Sig Start Date End Date Taking? Authorizing Provider  albuterol (PROVENTIL HFA;VENTOLIN HFA) 108 (90 Base) MCG/ACT inhaler Inhale 1 puff into the lungs every 6 (six) hours as needed for wheezing or shortness of breath.    [provider]  cyclobenzaprine (FLEXERIL) 5 MG tablet Take 2 tablets (10 mg total) by mouth 2 (two) times daily as needed for muscle spasms. 06/17/17   Caccavale,  Sophia, PA-C  diltiazem (CARDIZEM) 30 MG tablet Take 30 mg by mouth as needed (palpitations). Max 4 tablets with 24 hours    [provider]  esomeprazole (NEXIUM) 20 MG capsule TAKE 1 TABLET BY MOUTH EVERY OTHER DAY     [provider]  HYDROcodone-acetaminophen (NORCO/VICODIN) 5-325 MG tablet Take 1 tablet every 6 (six) hours as needed by mouth for severe pain. 06/22/17   [name], MD  ibuprofen (ADVIL,MOTRIN) 800 MG tablet Take 1 tablet (800 mg total) 3 (three) times daily with meals by mouth. 06/22/17   [name], MD  levocetirizine (XYZAL) 5 MG tablet Take 5 mg by mouth every evening.    [provider]  mupirocin cream (BACTROBAN) 2 % Apply 1 application topically 2 (two) times daily. 06/17/17   Caccavale, Sophia, PA-C  Probiotic Product (PROBIOTIC DAILY PO) Take 1 capsule by mouth daily as needed (as directed).     [provider]  sertraline (ZOLOFT) 50 MG tablet Take 50 mg by mouth daily.  04/02/11   [provider]    Family History Family History  Problem Relation Age of Onset  . Pancreatic cancer Mother   . Esophageal cancer Maternal Grandmother   . Heart disease Maternal Grandfather   . Heart disease Maternal Uncle   . Colon cancer Maternal  Aunt   . Stomach cancer Maternal Aunt   . Arthritis Unknown   . Cancer Unknown   . Diabetes Unknown     Social History Social History   Tobacco Use  . Smoking status: Former Smoker    Types: Cigarettes  . Smokeless tobacco: Never Used  . Tobacco comment: STRT AGE 21 QUIT AGE 41 OR 21  Substance Use Topics  . Alcohol use: No  . Drug use: No     Allergies   Amoxicillin   Review of Systems Review of Systems  Skin: Positive for wound. Negative for color change.       +skin itching  Neurological: Negative for numbness.     Physical Exam Updated Vital Signs BP (!) 161/101 (BP Location: Right Arm)   Pulse 77   Temp 97.6 F (36.4 C) (Oral)   Resp 18   Ht 5'  4" (1.626 m)   Wt 83.5 kg (184 lb)   SpO2 99%   BMI 31.58 kg/m   Physical Exam  Constitutional: She appears well-developed and well-nourished. No distress.  Sitting comfortably in bed.  HENT:  Head: Normocephalic and atraumatic.  Eyes: Conjunctivae are normal. Right eye exhibits no discharge. Left eye exhibits no discharge.  EOMs normal to gross examination.  Neck: Normal range of motion.  Pulmonary/Chest:  Normal respiratory effort. Patient converses comfortably. No audible wheeze or stridor.  Abdominal: She exhibits no distension.  Musculoskeletal: Normal range of motion.  Neurological: She is alert.  Cranial nerves intact to gross observation. Patient moves extremities without difficulty.  Skin: Skin is warm and dry. She is not diaphoretic.  Well-healed and well aligned scar superior to the left eyebrow.  There is no evidence of erythema or edema.  5 Prolene sutures in place.  Psychiatric: She has a normal mood and affect. Her behavior is normal. Judgment and thought content normal.  Nursing note and vitals reviewed.    ED Treatments / Results  Labs (all labs ordered are listed, but only abnormal results are displayed) Labs Reviewed - No data to display  EKG  EKG Interpretation None       Radiology No results found.  Procedures .Suture Removal Date/Time: 06/24/2017 5:30 PM Performed by: Albesa Seen, PA-C Authorized by: Albesa Seen, PA-C   Consent:    Consent obtained:  Verbal   Consent given by:  Patient Location:    Location:  Head/neck   Head/neck location:  Forehead Procedure details:    Wound appearance:  No signs of infection and clean   Sutures removed: 5. Post-procedure details:    Post-removal:  Steri-Strips applied   Patient tolerance of procedure:  Tolerated well, no immediate complications   (including critical care time)  Medications Ordered in ED Medications - No data to display   Initial Impression / Assessment and Plan /  ED Course  I have reviewed the triage vital signs and the nursing notes.  Pertinent labs & imaging results that were available during my care of the patient were reviewed by me and considered in my medical decision making (see chart for details).      Final Clinical Impressions(s) / ED Diagnoses   Final diagnoses:  Visit for suture removal   Patient is well-appearing and in no acute distress from her suture removal.  The wound is intact, clean, and dry.  Wound edges are well healed and aligned.  Applied Steri-Strips and instructed patient to apply additional Steri-Strips as needed for comfort.  Return precautions given  for any erythema, swelling, or increasing pain.  ED Discharge Orders    None        Tamala Julian 06/24/17 1825    Lacretia Leigh, MD 06/27/17 [phone]

## 2017-07-04 ENCOUNTER — Encounter: Payer: Self-pay | Admitting: Orthopedic Surgery

## 2017-07-04 ENCOUNTER — Ambulatory Visit (INDEPENDENT_AMBULATORY_CARE_PROVIDER_SITE_OTHER): Payer: 59

## 2017-07-04 ENCOUNTER — Ambulatory Visit (INDEPENDENT_AMBULATORY_CARE_PROVIDER_SITE_OTHER): Payer: Self-pay | Admitting: Orthopedic Surgery

## 2017-07-04 VITALS — BP 136/86 | HR 86 | Ht 64.0 in | Wt 182.0 lb

## 2017-07-04 DIAGNOSIS — S42295D Other nondisplaced fracture of upper end of left humerus, subsequent encounter for fracture with routine healing: Secondary | ICD-10-CM

## 2017-07-04 DIAGNOSIS — S42202A Unspecified fracture of upper end of left humerus, initial encounter for closed fracture: Secondary | ICD-10-CM | POA: Insufficient documentation

## 2017-07-04 NOTE — Progress Notes (Signed)
fractur

## 2017-07-04 NOTE — Progress Notes (Signed)
Encounter Diagnosis  Name Primary?  . Other closed nondisplaced fracture of proximal end of left humerus with routine healing, subsequent encounter Yes    Chief Complaint  Patient presents with  . Fracture    left shoulder fracture 06/17/17    Complains of pain left shoulder stiffness left shoulder.  Pain has improved to where she can take ibuprofen  X-rays today: AP lateral and axillary view show stable fracture no significant displacement  Passive range of motion abduction 45 degrees flexion 50 degrees external rotation 30 degrees  Start physical therapy for 6 weeks of hand rehab  3 times a week  6-week follow-up

## 2017-07-19 ENCOUNTER — Ambulatory Visit: Payer: 59 | Attending: Orthopedic Surgery | Admitting: Rehabilitation

## 2017-07-19 ENCOUNTER — Encounter: Payer: Self-pay | Admitting: Rehabilitation

## 2017-07-19 DIAGNOSIS — M25512 Pain in left shoulder: Secondary | ICD-10-CM | POA: Diagnosis present

## 2017-07-19 DIAGNOSIS — M25612 Stiffness of left shoulder, not elsewhere classified: Secondary | ICD-10-CM | POA: Insufficient documentation

## 2017-07-19 DIAGNOSIS — M6281 Muscle weakness (generalized): Secondary | ICD-10-CM | POA: Diagnosis present

## 2017-07-19 NOTE — Therapy (Signed)
[place], [place], [address] Phone: [phone]   Fax:  [phone]  Physical Therapy Evaluation  Patient Details  Name: Kathryn Gallegos MRN: [id number] Date of Birth: 09-16-1955 Referring Provider: Arther Abbott, MD   Encounter Date: 07/19/2017  PT End of Session - 07/19/17 1642    Visit Number  1    Number of Visits  18    Date for PT Re-Evaluation  08/30/17    Authorization Type  [employer]    PT Start Time  4196    PT Stop Time  1630    PT Time Calculation (min)  45 min    Activity Tolerance  Patient tolerated treatment well    Behavior During Therapy  [facility] for tasks assessed/performed       Past Medical History:  Diagnosis Date  . Anxiety   . Anxiety and depression   . Asthma   . Borderline diabetes   . Carpal tunnel syndrome 07/05/2007   Qualifier: Diagnosis of  By: [name], [name]    . HIGH BLOOD PRESSURE 07/04/2007   Qualifier: Diagnosis of  By: [name], [name]    . HTN (hypertension)   . Hyperglycemia   . Hyperlipidemia   . IMPINGEMENT SYNDROME 03/09/2010   Qualifier: Diagnosis of  By: [name], [name]    . Obesity   . Rapid palpitations   . Seasonal allergies     Past Surgical History:  Procedure Laterality Date  . TOTAL ABDOMINAL HYSTERECTOMY    . TUBAL LIGATION      There were no vitals filed for this visit.   Subjective Assessment - 07/19/17 1544    Subjective  Pt presents after L proximal humerus fracture on 06/17/17 after falling down.  xrays at the ED and recent xrays showing no humeral head displacement and a stable site.  Reports using the sling as needed.  not really the using the arm now and having weakness and pain with useage.  Reports no limitations at work but not using the arm much.      Pertinent History  DM, asthma, HTN, high cholesterol    Diagnostic tests  xray on 11/19 showing stable fracture status    Patient Stated Goals  get the arm back, be  able to raise the arm.      Currently in Pain?  Yes    Pain Score  1  up to 8/10 with raising the arm    Pain Orientation  Left    Pain Descriptors / Indicators  Sharp    Pain Type  Acute pain    Pain Radiating Towards  denies this and n/t    Pain Onset  1 to 4 weeks ago    Pain Frequency  Constant    Aggravating Factors   use    Pain Relieving Factors  heat, hydrocodone, and ibuprofen 800 as needed    Effect of Pain on Daily Activities  unable to use L UE         [facility] PT Assessment - 07/19/17 0001      Assessment   Medical Diagnosis  L proximal humerus fracture    Referring Provider  Arther Abbott, MD    Onset Date/Surgical Date  06/17/17    Hand Dominance  Right    Next MD Visit  uknown    Prior Therapy  no      Precautions   Precaution Comments  no lifting more than 50#  Restrictions   Weight Bearing Restrictions  No      Balance Screen   Has the patient fallen in the past 6 months  No      [place] residence      Prior Function   Vocation  Full time employment    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      Cognition   Overall Cognitive Status  Within Functional Limits for tasks assessed      Observation/Other Assessments   Focus on Therapeutic Outcomes (FOTO)   70% limited      ROM / Strength   AROM / PROM / Strength  AROM;PROM;Strength      AROM   Overall AROM Comments  all with pain    AROM Assessment Site  Shoulder;Elbow    Right/Left Shoulder  Left    Left Shoulder Flexion  60 Degrees    Left Shoulder ABduction  75 Degrees    Left Shoulder Internal Rotation  -- to glute    Left Shoulder External Rotation  -- 50% behind the head    Right/Left Elbow  Left      PROM   Overall PROM Comments  all with end range pain    PROM Assessment Site  Shoulder    Right/Left Shoulder  Right;Left    Left Shoulder Flexion  100 Degrees    Left Shoulder ABduction  115 Degrees    Left Shoulder Internal  Rotation  -- to the belly    Left Shoulder External Rotation  75 Degrees      Strength   Overall Strength Comments  not tested             Objective measurements completed on examination: See above findings.      [facility] Adult PT Treatment/Exercise - 07/19/17 0001      Exercises   Exercises  Other Exercises    Other Exercises   performance of HEP per instruction section x 5 each             PT Education - 07/19/17 1634    Education provided  Yes    Education Details  Diagnosis, POC, HEP    Person(s) Educated  Patient    Methods  Explanation;Demonstration;Tactile cues;Verbal cues;Handout    Comprehension  Verbalized understanding;Returned demonstration;Verbal cues required;Tactile cues required          PT Long Term Goals - 07/19/17 1723      PT LONG TERM GOAL #1   Title  Pt will improve shoulder AROM to within 10degrees of normal     Time  6    Period  Weeks    Status  New    Target Date  08/30/17      PT LONG TERM GOAL #2   Title  Pt will improve FOTO to 39% limited or less to demonstrate functional improvement    Time  6    Period  Weeks    Status  New    Target Date  08/30/17      PT LONG TERM GOAL #3   Title  Pt will demonstrate L shoulder MMT of 4/5 or greater in all planes    Time  6    Period  Weeks    Status  New    Target Date  08/30/17      PT LONG TERM GOAL #4   Title  Pt will be able to do her hair without limitations  from the L UE    Time  6    Period  Weeks    Status  New    Target Date  08/30/17             Plan - 07/19/17 1643    Clinical Impression Statement  Pt presents 30 days after L proximal humerus fracture after a fall. Initial and recent xrays showing good healing and stable status.  Deficits include decrease active and passive motion in all directions, pain, and limited functional use of the L UE.  Will establish 3xper week per intial script down to 2 as tolerated.      Clinical Presentation  Stable     Clinical Decision Making  Low    Rehab Potential  Excellent    PT Frequency  3x / week down to 2 as needed    PT Duration  6 weeks    PT Treatment/Interventions  Electrical Stimulation;Cryotherapy;Moist Heat;Therapeutic exercise;Manual techniques;Passive range of motion    PT Next Visit Plan  review HEP, begin L shoulder ROM and functional mobility.  modalities as needed    PT Home Exercise Plan  handout given 12/4    Consulted and Agree with Plan of Care  Patient       Patient will benefit from skilled therapeutic intervention in order to improve the following deficits and impairments:  Decreased activity tolerance, Decreased strength, Pain, Decreased range of motion  Visit Diagnosis: Stiffness of left shoulder, not elsewhere classified - Plan: PT plan of care cert/re-cert  Acute pain of left shoulder - Plan: PT plan of care cert/re-cert  Muscle weakness (generalized) - Plan: PT plan of care cert/re-cert     Problem List Patient Active Problem List   Diagnosis Date Noted  . Closed fracture of left proximal humerus 06/17/17 07/04/2017  . IMPINGEMENT SYNDROME 03/09/2010  . CARPAL TUNNEL SYNDROME 07/05/2007  . HIGH BLOOD PRESSURE 07/04/2007    Stark Bray, DPT, CMP 07/19/2017, 5:27 PM  [facility] [address] [place], [place], [address] Phone: [phone]   Fax:  [phone]  Name: DOROTHY POLHEMUS MRN: [id number] Date of Birth: [birth year]

## 2017-07-20 ENCOUNTER — Telehealth: Payer: Self-pay | Admitting: Physical Therapy

## 2017-07-20 ENCOUNTER — Ambulatory Visit: Payer: 59 | Admitting: Physical Therapy

## 2017-07-20 NOTE — Telephone Encounter (Signed)
LM regarding NS today. Advised of next appointment time and requested call back if she is unable to attend.  Serah Nicoletti C. Arlana Canizales PT, DPT 07/20/17 5:35 PM

## 2017-07-25 ENCOUNTER — Ambulatory Visit: Payer: 59 | Admitting: Physical Therapy

## 2017-07-26 ENCOUNTER — Telehealth: Payer: Self-pay | Admitting: Radiology

## 2017-07-26 ENCOUNTER — Other Ambulatory Visit: Payer: Self-pay | Admitting: Radiology

## 2017-07-26 NOTE — Telephone Encounter (Signed)
Patient called wanting disability statement faxed to [facility] fax number is [phone] phone number is [phone]

## 2017-07-26 NOTE — Telephone Encounter (Signed)
Patient called in today requesting refill on Hydrocodone last written 06/22/17

## 2017-07-27 ENCOUNTER — Other Ambulatory Visit: Payer: Self-pay | Admitting: Orthopedic Surgery

## 2017-07-27 ENCOUNTER — Encounter: Payer: 59 | Admitting: Physical Therapy

## 2017-07-27 DIAGNOSIS — S4292XD Fracture of left shoulder girdle, part unspecified, subsequent encounter for fracture with routine healing: Secondary | ICD-10-CM

## 2017-07-27 MED ORDER — HYDROCODONE-ACETAMINOPHEN 5-325 MG PO TABS: 1.0000 | ORAL_TABLET | Freq: Four times a day (QID) | ORAL | 0 refills | Status: DC | PRN

## 2017-07-27 NOTE — Telephone Encounter (Signed)
[place]

## 2017-07-27 NOTE — Telephone Encounter (Signed)
Patient made aware.

## 2017-07-27 NOTE — Telephone Encounter (Signed)
Patient made aware of forms process; will be going out via [employer] process, both fax and mail, by tomorrow at latest, upon representative coming to our site to do so.  We have notified the requester also.

## 2017-07-29 ENCOUNTER — Encounter: Payer: 59 | Admitting: Physical Therapy

## 2017-08-01 ENCOUNTER — Ambulatory Visit: Payer: 59

## 2017-08-01 DIAGNOSIS — M25512 Pain in left shoulder: Secondary | ICD-10-CM

## 2017-08-01 DIAGNOSIS — M25612 Stiffness of left shoulder, not elsewhere classified: Secondary | ICD-10-CM | POA: Diagnosis not present

## 2017-08-01 DIAGNOSIS — M6281 Muscle weakness (generalized): Secondary | ICD-10-CM

## 2017-08-01 NOTE — Patient Instructions (Signed)
Issued cross body stretch and  Wall slide stretch and ER in corner or door passive 2-3x/day 2-3 reps 20-30 sec

## 2017-08-01 NOTE — Therapy (Signed)
[place], [place], [address] Phone: [phone]   Fax:  [phone]  Physical Therapy Treatment  Patient Details  Name: Kathryn Gallegos MRN: [id number] Date of Birth: 24-May-1956 Referring Provider: Arther Abbott, MD   Encounter Date: 08/01/2017  PT End of Session - 08/01/17 1508    Visit Number  2    Number of Visits  18    Date for PT Re-Evaluation  08/30/17    Authorization Type  UHC    PT Start Time  0308 late    PT Stop Time  0346    PT Time Calculation (min)  38 min    Activity Tolerance  Patient tolerated treatment well    Behavior During Therapy  [facility] for tasks assessed/performed       Past Medical History:  Diagnosis Date  . Anxiety   . Anxiety and depression   . Asthma   . Borderline diabetes   . Carpal tunnel syndrome 07/05/2007   Qualifier: Diagnosis of  By: [name], [name]    . HIGH BLOOD PRESSURE 07/04/2007   Qualifier: Diagnosis of  By: [name], [name]    . HTN (hypertension)   . Hyperglycemia   . Hyperlipidemia   . IMPINGEMENT SYNDROME 03/09/2010   Qualifier: Diagnosis of  By: [name], [name]    . Obesity   . Rapid palpitations   . Seasonal allergies     Past Surgical History:  Procedure Laterality Date  . TOTAL ABDOMINAL HYSTERECTOMY    . TUBAL LIGATION      There were no vitals filed for this visit.  Subjective Assessment - 08/01/17 1509    Subjective  Yesterday LT wrist hurt .  Types at work . Ergonomic keyboard helps.     Currently in Pain?  Yes    Pain Score  4     Pain Location  Shoulder    Pain Orientation  Left    Pain Descriptors / Indicators  Sore    Pain Type  Acute pain    Pain Onset  More than a month ago    Pain Frequency  Constant    Aggravating Factors   using     Pain Relieving Factors  heat , meds                      [facility] Adult PT Treatment/Exercise - 08/01/17 0001      Shoulder Exercises: Standing   Other Standing Exercises  Reviewed all HEp and she needed cues but did well post cuing      Shoulder Exercises: Pulleys   Flexion  2 minutes       Worked reaching with 2 pounds overhead with manual stretch end range flexion and abduction bending elbow in transition.       PT Education - 08/01/17 1545    Education provided  Yes    Education Details  stretching    Person(s) Educated  Patient    Methods  Explanation    Comprehension  Verbalized understanding          PT Long Term Goals - 07/19/17 1723      PT LONG TERM GOAL #1   Title  Pt will improve shoulder AROM to within 10degrees of normal     Time  6    Period  Weeks    Status  New    Target Date  08/30/17      PT LONG TERM  GOAL #2   Title  Pt will improve FOTO to 39% limited or less to demonstrate functional improvement    Time  6    Period  Weeks    Status  New    Target Date  08/30/17      PT LONG TERM GOAL #3   Title  Pt will demonstrate L shoulder MMT of 4/5 or greater in all planes    Time  6    Period  Weeks    Status  New    Target Date  08/30/17      PT LONG TERM GOAL #4   Title  Pt will be able to do her hair without limitations from the L UE    Time  6    Period  Weeks    Status  New    Target Date  08/30/17            Plan - 08/01/17 1546    Clinical Impression Statement  She is doing well limited by pain and stiffness but appears she is activily able to more better an lifted light weight over head  with effort. May need to start isometrics    PT Treatment/Interventions  Electrical Stimulation;Cryotherapy;Moist Heat;Therapeutic exercise;Manual techniques;Passive range of motion    PT Next Visit Plan  review HEP, begin L shoulder ROM and functional mobility.  modalities as needed    PT Home Exercise Plan  cross body stretch, wall ER stretch and corner overhead stretch    Consulted and Agree with Plan of Care  Patient       Patient will benefit from skilled therapeutic intervention  in order to improve the following deficits and impairments:  Decreased activity tolerance, Decreased strength, Pain, Decreased range of motion  Visit Diagnosis: Stiffness of left shoulder, not elsewhere classified  Acute pain of left shoulder  Muscle weakness (generalized)     Problem List Patient Active Problem List   Diagnosis Date Noted  . Closed fracture of left proximal humerus 06/17/17 07/04/2017  . IMPINGEMENT SYNDROME 03/09/2010  . CARPAL TUNNEL SYNDROME 07/05/2007  . HIGH BLOOD PRESSURE 07/04/2007    Darrel Hoover  PT 08/01/2017, 3:48 PM  [facility] [address] [place], [place], [address] Phone: [phone]   Fax:  [phone]  Name: Kathryn Gallegos MRN: [id number] Date of Birth: Apr 08, 1956

## 2017-08-03 ENCOUNTER — Ambulatory Visit: Payer: 59 | Admitting: Physical Therapy

## 2017-08-03 ENCOUNTER — Encounter: Payer: Self-pay | Admitting: Physical Therapy

## 2017-08-03 DIAGNOSIS — M25612 Stiffness of left shoulder, not elsewhere classified: Secondary | ICD-10-CM

## 2017-08-03 DIAGNOSIS — M25512 Pain in left shoulder: Secondary | ICD-10-CM

## 2017-08-03 DIAGNOSIS — M6281 Muscle weakness (generalized): Secondary | ICD-10-CM

## 2017-08-04 NOTE — Therapy (Signed)
[place], [place], [address] Phone: [phone]   Fax:  [phone]  Physical Therapy Treatment  Patient Details  Name: Kathryn Gallegos MRN: [id number] Date of Birth: 1955-12-06 Referring Provider: Arther Abbott, MD   Encounter Date: 08/03/2017  PT End of Session - 08/03/17 1537    Visit Number  3    Number of Visits  18    Date for PT Re-Evaluation  08/30/17    Authorization Type  [employer]    PT Start Time  1507 Patient 7 minutes late for appointment     PT Stop Time  1545    PT Time Calculation (min)  38 min    Activity Tolerance  Patient tolerated treatment well    Behavior During Therapy  [facility] for tasks assessed/performed       Past Medical History:  Diagnosis Date  . Anxiety   . Anxiety and depression   . Asthma   . Borderline diabetes   . Carpal tunnel syndrome 07/05/2007   Qualifier: Diagnosis of  By: [name], [name]    . HIGH BLOOD PRESSURE 07/04/2007   Qualifier: Diagnosis of  By: [name], [name]    . HTN (hypertension)   . Hyperglycemia   . Hyperlipidemia   . IMPINGEMENT SYNDROME 03/09/2010   Qualifier: Diagnosis of  By: [name], [name]    . Obesity   . Rapid palpitations   . Seasonal allergies     Past Surgical History:  Procedure Laterality Date  . TOTAL ABDOMINAL HYSTERECTOMY    . TUBAL LIGATION      There were no vitals filed for this visit.  Subjective Assessment - 08/03/17 1511    Subjective  Patient reports she was sore after the last treatment. She took a pain pill before coming today, She feels like the cold makes it a little sore too.     Pertinent History  DM, asthma, HTN, high cholesterol    Diagnostic tests  xray on 11/19 showing stable fracture status    Patient Stated Goals  get the arm back, be able to raise the arm.      Currently in Pain?  Yes    Pain Score  5     Pain Location  Shoulder    Pain Orientation  Left    Pain Descriptors /  Indicators  Sore    Pain Type  Acute pain    Pain Onset  More than a month ago    Aggravating Factors   using     Pain Relieving Factors  heat, meds     Effect of Pain on Daily Activities  unable to use L UE                               PT Education - 08/03/17 1512    Education provided  Yes    Education Details  reviewed the importance of stretching     Person(s) Educated  Patient    Methods  Explanation;Demonstration;Verbal cues;Tactile cues    Comprehension  Verbalized understanding;Returned demonstration          PT Long Term Goals - 07/19/17 1723      PT LONG TERM GOAL #1   Title  Pt will improve shoulder AROM to within 10degrees of normal     Time  6    Period  Weeks    Status  New  Target Date  08/30/17      PT LONG TERM GOAL #2   Title  Pt will improve FOTO to 39% limited or less to demonstrate functional improvement    Time  6    Period  Weeks    Status  New    Target Date  08/30/17      PT LONG TERM GOAL #3   Title  Pt will demonstrate L shoulder MMT of 4/5 or greater in all planes    Time  6    Period  Weeks    Status  New    Target Date  08/30/17      PT LONG TERM GOAL #4   Title  Pt will be able to do her hair without limitations from the L UE    Time  6    Period  Weeks    Status  New    Target Date  08/30/17            Plan - 08/03/17 1537    Clinical Impression Statement  Patient tolerated treatment well. She started with some initial tightness but her IR and flexion improved significantly with stretching. Therapy reviewed wanbd exercises with the patient. She reported last time she was doing standing flexion with a weight and it made it sore. That was held this visit. Therapy added scpaular exercises with     Clinical Presentation  Stable    Clinical Decision Making  Low    Rehab Potential  Excellent    PT Frequency  3x / week    PT Duration  6 weeks    PT Treatment/Interventions  Electrical  Stimulation;Cryotherapy;Moist Heat;Therapeutic exercise;Manual techniques;Passive range of motion    PT Next Visit Plan  review HEP, begin L shoulder ROM and functional mobility.  modalities as needed    PT Home Exercise Plan  cross body stretch, wall ER stretch and corner overhead stretch       Patient will benefit from skilled therapeutic intervention in order to improve the following deficits and impairments:  Decreased activity tolerance, Decreased strength, Pain, Decreased range of motion  Visit Diagnosis: Stiffness of left shoulder, not elsewhere classified  Acute pain of left shoulder  Muscle weakness (generalized)     Problem List Patient Active Problem List   Diagnosis Date Noted  . Closed fracture of left proximal humerus 06/17/17 07/04/2017  . IMPINGEMENT SYNDROME 03/09/2010  . CARPAL TUNNEL SYNDROME 07/05/2007  . HIGH BLOOD PRESSURE 07/04/2007    [name] PT DPT  08/04/2017, 9:20 AM  [facility] [address] [place], [place], [address] Phone: [phone]   Fax:  [phone]  Name: Kathryn Gallegos MRN: [id number] Date of Birth: 09-13-1955

## 2017-08-05 ENCOUNTER — Encounter: Payer: Self-pay | Admitting: Physical Therapy

## 2017-08-05 ENCOUNTER — Ambulatory Visit: Payer: 59 | Admitting: Physical Therapy

## 2017-08-05 DIAGNOSIS — M25612 Stiffness of left shoulder, not elsewhere classified: Secondary | ICD-10-CM | POA: Diagnosis not present

## 2017-08-05 DIAGNOSIS — M25512 Pain in left shoulder: Secondary | ICD-10-CM

## 2017-08-05 DIAGNOSIS — M6281 Muscle weakness (generalized): Secondary | ICD-10-CM

## 2017-08-05 NOTE — Therapy (Signed)
[place], [place], [address] Phone: [phone]   Fax:  [phone]  Physical Therapy Treatment  Patient Details  Name: Kathryn Gallegos MRN: [id number] Date of Birth: [birth year] Referring Provider: Arther Abbott, MD   Encounter Date: 08/05/2017  PT End of Session - 08/05/17 1108    Visit Number  4    Number of Visits  18    Date for PT Re-Evaluation  08/30/17    Authorization Type  UHC    PT Start Time  1105    PT Stop Time  1155    PT Time Calculation (min)  50 min       Past Medical History:  Diagnosis Date  . Anxiety   . Anxiety and depression   . Asthma   . Borderline diabetes   . Carpal tunnel syndrome 07/05/2007   Qualifier: Diagnosis of  By: [name], [name]    . HIGH BLOOD PRESSURE 07/04/2007   Qualifier: Diagnosis of  By: [name], [name]    . HTN (hypertension)   . Hyperglycemia   . Hyperlipidemia   . IMPINGEMENT SYNDROME 03/09/2010   Qualifier: Diagnosis of  By: [name], [name]    . Obesity   . Rapid palpitations   . Seasonal allergies     Past Surgical History:  Procedure Laterality Date  . TOTAL ABDOMINAL HYSTERECTOMY    . TUBAL LIGATION      There were no vitals filed for this visit.  Subjective Assessment - 08/05/17 1219    Subjective  I feel alot better. No pain after last session.     Currently in Pain?  No/denies         [facility] PT Assessment - 08/05/17 0001      AROM   Left Shoulder Flexion  140 Degrees    Left Shoulder ABduction  132 Degrees    Left Shoulder Internal Rotation  -- reach to lumbar     Left Shoulder External Rotation  -- reach to T 2                   [facility] Adult PT Treatment/Exercise - 08/05/17 0001      Shoulder Exercises: Pulleys   Flexion  2 minutes      Shoulder Exercises: ROM/Strengthening   Other ROM/Strengthening Exercises  scap retraction  red 2x10; shoulder extension 2x10     Other  ROM/Strengthening Exercises  IR/ ER yellow band , punch yellow band, no increased pain      Shoulder Exercises: Stretch   Corner Stretch  3 reps;10 seconds    Wall Stretch - Flexion  5 reps;10 seconds      Manual Therapy   Manual therapy comments  PROM into flexion/ ER/ IR gentle grade 1 and 2 PA and AP  mpbilizations to improve ER/IR                   PT Long Term Goals - 07/19/17 1723      PT LONG TERM GOAL #1   Title  Pt will improve shoulder AROM to within 10degrees of normal     Time  6    Period  Weeks    Status  New    Target Date  08/30/17      PT LONG TERM GOAL #2   Title  Pt will improve FOTO to 39% limited or less to demonstrate functional improvement    Time  6  Period  Weeks    Status  New    Target Date  08/30/17      PT LONG TERM GOAL #3   Title  Pt will demonstrate L shoulder MMT of 4/5 or greater in all planes    Time  6    Period  Weeks    Status  New    Target Date  08/30/17      PT LONG TERM GOAL #4   Title  Pt will be able to do her hair without limitations from the L UE    Time  6    Period  Weeks    Status  New    Target Date  08/30/17            Plan - 08/05/17 1216    Clinical Impression Statement  Pt reports no increased pain after last treatment or with therex today. Some discomfort with PROM into flexion.  Standing AROM much improved since eval. Began rockwood with yellow theraband and pt had no increased pain.     PT Next Visit Plan  review HEP, begin L shoulder ROM and functional mobility.  modalities as needed, add bands to HEP     PT Home Exercise Plan  cross body stretch, wall ER stretch and corner overhead stretch    Consulted and Agree with Plan of Care  Patient       Patient will benefit from skilled therapeutic intervention in order to improve the following deficits and impairments:  Decreased activity tolerance, Decreased strength, Pain, Decreased range of motion  Visit Diagnosis: Stiffness of left shoulder,  not elsewhere classified  Acute pain of left shoulder  Muscle weakness (generalized)     Problem List Patient Active Problem List   Diagnosis Date Noted  . Closed fracture of left proximal humerus 06/17/17 07/04/2017  . IMPINGEMENT SYNDROME 03/09/2010  . CARPAL TUNNEL SYNDROME 07/05/2007  . HIGH BLOOD PRESSURE 07/04/2007    [name], PTA 08/05/2017, 12:20 PM  [facility] [address] [place], [place], [address] Phone: [phone]   Fax:  [phone]  Name: YAZLEEMAR STRASSNER MRN: [id number] Date of Birth: 07/12/1956

## 2017-08-08 ENCOUNTER — Ambulatory Visit: Payer: 59 | Admitting: Physical Therapy

## 2017-08-10 ENCOUNTER — Ambulatory Visit: Payer: 59 | Admitting: Physical Therapy

## 2017-08-10 ENCOUNTER — Encounter: Payer: Self-pay | Admitting: Physical Therapy

## 2017-08-10 DIAGNOSIS — M25612 Stiffness of left shoulder, not elsewhere classified: Secondary | ICD-10-CM | POA: Diagnosis not present

## 2017-08-10 DIAGNOSIS — M25512 Pain in left shoulder: Secondary | ICD-10-CM

## 2017-08-10 DIAGNOSIS — M6281 Muscle weakness (generalized): Secondary | ICD-10-CM

## 2017-08-11 NOTE — Therapy (Signed)
[place], [place], [address] Phone: [phone]   Fax:  [phone]  Physical Therapy Treatment  Patient Details  Name: Kathryn Gallegos MRN: [id number] Date of Birth: 1955-09-03 Referring Provider: Arther Abbott, MD   Encounter Date: 08/10/2017  PT End of Session - 08/11/17 0811    Visit Number  5    Number of Visits  18    Date for PT Re-Evaluation  08/30/17    Authorization Type  UHC    PT Start Time  1504    PT Stop Time  1555    PT Time Calculation (min)  51 min    Activity Tolerance  Patient tolerated treatment well    Behavior During Therapy  [facility] for tasks assessed/performed       Past Medical History:  Diagnosis Date  . Anxiety   . Anxiety and depression   . Asthma   . Borderline diabetes   . Carpal tunnel syndrome 07/05/2007   Qualifier: Diagnosis of  By: [name], [name]    . HIGH BLOOD PRESSURE 07/04/2007   Qualifier: Diagnosis of  By: [name], [name]    . HTN (hypertension)   . Hyperglycemia   . Hyperlipidemia   . IMPINGEMENT SYNDROME 03/09/2010   Qualifier: Diagnosis of  By: [name], [name]    . Obesity   . Rapid palpitations   . Seasonal allergies     Past Surgical History:  Procedure Laterality Date  . TOTAL ABDOMINAL HYSTERECTOMY    . TUBAL LIGATION      There were no vitals filed for this visit.  Subjective Assessment - 08/10/17 1509    Subjective  Patient reports she is a little sore today but overall it is doing better. She felt fine after the last treatment.     Pertinent History  DM, asthma, HTN, high cholesterol    Diagnostic tests  xray on 11/19 showing stable fracture status    Patient Stated Goals  get the arm back, be able to raise the arm.      Currently in Pain?  Yes    Pain Score  4     Pain Orientation  Left    Pain Descriptors / Indicators  Sore    Pain Type  Acute pain    Pain Onset  More than a month ago    Pain Frequency   Constant    Aggravating Factors   using     Pain Relieving Factors  eat and meds     Effect of Pain on Daily Activities  unable to use L UE for mobility     Multiple Pain Sites  No                      [facility] Adult PT Treatment/Exercise - 08/11/17 0001      Transfers   Comments  sit to stand statted at 21/2 to ythe bottom of the table; 20 1/2 2nd trial       Shoulder Exercises: Prone   Other Prone Exercises  wand flexion 2x5; wand er 2x10      Shoulder Exercises: Standing   Other Standing Exercises  wnad IR x10       Shoulder Exercises: Pulleys   Flexion  3 minutes      Shoulder Exercises: ROM/Strengthening   Other ROM/Strengthening Exercises  scap retraction  red 2x10; shoulder extension 2x10     Other ROM/Strengthening Exercises  IR/ ER yellow  band , punch yellow band, no increased pain 2x10 each      Shoulder Exercises: Stretch   Corner Stretch  3 reps;10 seconds    Wall Stretch - Flexion  5 reps;10 seconds      Manual Therapy   Manual therapy comments  PROM into flexion/ ER/ IR gentle grade 1 and 2 PA and AP  mpbilizations to improve ER/IR              PT Education - 08/10/17 1512    Education provided  Yes    Education Details  reviewed the improtance of stretchingand strengthening.     Person(s) Educated  Patient    Methods  Explanation;Demonstration;Tactile cues;Verbal cues    Comprehension  Verbalized understanding;Returned demonstration;Verbal cues required;Tactile cues required          PT Long Term Goals - 08/10/17 1543      PT LONG TERM GOAL #1   Title  Pt will improve shoulder AROM to within 10degrees of normal     Time  6    Period  Weeks    Status  On-going      PT LONG TERM GOAL #2   Title  Pt will improve FOTO to 39% limited or less to demonstrate functional improvement    Time  6    Period  Weeks    Status  On-going      PT LONG TERM GOAL #3   Title  Pt will demonstrate L shoulder MMT of 4/5 or greater in all planes     Time  6    Period  Weeks    Status  On-going      PT LONG TERM GOAL #4   Title  Pt will be able to do her hair without limitations from the L UE    Time  6    Period  Weeks    Status  On-going            Plan - 08/11/17 3785    Clinical Impression Statement  Patient is making good progress. Therapy added sidelying ER. She had some fatigue but no pain. Per visul inspection her passive motion is improving. She was encouraged to continue her exercises at home. She is motiated to do so.     Clinical Presentation  Stable    Clinical Decision Making  Low    Rehab Potential  Excellent    PT Frequency  3x / week    PT Duration  6 weeks    PT Treatment/Interventions  Electrical Stimulation;Cryotherapy;Moist Heat;Therapeutic exercise;Manual techniques;Passive range of motion    PT Next Visit Plan  review HEP, begin L shoulder ROM and functional mobility.  modalities as needed, add bands to HEP     PT Home Exercise Plan  cross body stretch, wall ER stretch and corner overhead stretch    Consulted and Agree with Plan of Care  Patient       Patient will benefit from skilled therapeutic intervention in order to improve the following deficits and impairments:  Decreased activity tolerance, Decreased strength, Pain, Decreased range of motion  Visit Diagnosis: Stiffness of left shoulder, not elsewhere classified  Acute pain of left shoulder  Muscle weakness (generalized)     Problem List Patient Active Problem List   Diagnosis Date Noted  . Closed fracture of left proximal humerus 06/17/17 07/04/2017  . IMPINGEMENT SYNDROME 03/09/2010  . CARPAL TUNNEL SYNDROME 07/05/2007  . HIGH BLOOD PRESSURE 07/04/2007    Grayling Congress  Kayleen Memos PT DPT  08/11/2017, 8:32 AM  [facility] [address] [place], [place], [address] Phone: [phone]   Fax:  [phone]  Name: Kathryn Gallegos MRN: [id number] Date of Birth: [birth year]

## 2017-08-12 ENCOUNTER — Ambulatory Visit: Payer: 59 | Admitting: Physical Therapy

## 2017-08-12 DIAGNOSIS — M25512 Pain in left shoulder: Secondary | ICD-10-CM

## 2017-08-12 DIAGNOSIS — M25612 Stiffness of left shoulder, not elsewhere classified: Secondary | ICD-10-CM

## 2017-08-12 DIAGNOSIS — M6281 Muscle weakness (generalized): Secondary | ICD-10-CM

## 2017-08-12 NOTE — Patient Instructions (Addendum)
  Side Pull: Double Arm   On back, knees bent, feet flat. Arms perpendicular to body, shoulder level, elbows straight but relaxed. Pull arms out to sides, elbows straight. Resistance band comes across collarbones, hands toward floor. Hold momentarily. Slowly return to starting position. Repeat _10-20__ times. Band color _Y____   Elmer Picker

## 2017-08-12 NOTE — Therapy (Signed)
[place], [place], [address] Phone: [phone]   Fax:  [phone]  Physical Therapy Treatment  Patient Details  Name: Kathryn Gallegos MRN: [id number] Date of Birth: Sep 04, 1955 Referring Provider: Arther Abbott, MD   Encounter Date: 08/12/2017  PT End of Session - 08/12/17 1111    Visit Number  6    Number of Visits  18    Date for PT Re-Evaluation  08/30/17    Authorization Type  UHC    PT Start Time  1106 6 minutes late     PT Stop Time  1146    PT Time Calculation (min)  40 min       Past Medical History:  Diagnosis Date  . Anxiety   . Anxiety and depression   . Asthma   . Borderline diabetes   . Carpal tunnel syndrome 07/05/2007   Qualifier: Diagnosis of  By: [name], [name]    . HIGH BLOOD PRESSURE 07/04/2007   Qualifier: Diagnosis of  By: [name], [name]    . HTN (hypertension)   . Hyperglycemia   . Hyperlipidemia   . IMPINGEMENT SYNDROME 03/09/2010   Qualifier: Diagnosis of  By: [name], [name]    . Obesity   . Rapid palpitations   . Seasonal allergies     Past Surgical History:  Procedure Laterality Date  . TOTAL ABDOMINAL HYSTERECTOMY    . TUBAL LIGATION      There were no vitals filed for this visit.  Subjective Assessment - 08/12/17 1116    Subjective  Rainy weather makes me achey.     Currently in Pain?  Yes    Pain Score  4     Pain Orientation  Left    Pain Descriptors / Indicators  Aching                      [facility] Adult PT Treatment/Exercise - 08/12/17 0001      Shoulder Exercises: Supine   Horizontal ABduction  10 reps yellow    Other Supine Exercises  supiine cane pullovers       Shoulder Exercises: Standing   Other Standing Exercises  UE ranger IR, and flexion on wall, wall slides bialteral       Shoulder Exercises: Pulleys   Flexion  3 minutes      Shoulder Exercises: ROM/Strengthening   Other ROM/Strengthening  Exercises  scap retraction  red 2x10; shoulder extension 2x10     Other ROM/Strengthening Exercises  IR/ ER red band , punch red band, no increased pain 2x10 each      Shoulder Exercises: Stretch   Wall Stretch - Flexion  5 reps;10 seconds      Manual Therapy   Manual therapy comments  PROM Flexion, abduction, ER, IR              PT Education - 08/12/17 1146    Education Details  HEP    Person(s) Educated  Patient    Methods  Explanation;Handout    Comprehension  Verbalized understanding          PT Long Term Goals - 08/10/17 1543      PT LONG TERM GOAL #1   Title  Pt will improve shoulder AROM to within 10degrees of normal     Time  6    Period  Weeks    Status  On-going      PT LONG TERM GOAL #2  Title  Pt will improve FOTO to 39% limited or less to demonstrate functional improvement    Time  6    Period  Weeks    Status  On-going      PT LONG TERM GOAL #3   Title  Pt will demonstrate L shoulder MMT of 4/5 or greater in all planes    Time  6    Period  Weeks    Status  On-going      PT LONG TERM GOAL #4   Title  Pt will be able to do her hair without limitations from the L UE    Time  6    Period  Weeks    Status  On-going            Plan - 08/12/17 1147    Clinical Impression Statement  Standing AROM limited today. Pt thinks it's due to the rainy weather making her achey. Used the UE ranger for [employer]. Progressed to red band for [place] exercises and began resisted horizontal abduction in supine with good tolerance.     PT Next Visit Plan  review HEP, begin L shoulder ROM and functional mobility.  modalities as needed, add bands to HEP     PT Home Exercise Plan  cross body stretch, wall ER stretch and corner overhead stretch, rockwood yellow band and scap rows, supine horizontal abudction yellow     Consulted and Agree with Plan of Care  Patient       Patient will benefit from skilled therapeutic intervention in order to improve the following  deficits and impairments:  Decreased activity tolerance, Decreased strength, Pain, Decreased range of motion  Visit Diagnosis: Stiffness of left shoulder, not elsewhere classified  Acute pain of left shoulder  Muscle weakness (generalized)     Problem List Patient Active Problem List   Diagnosis Date Noted  . Closed fracture of left proximal humerus 06/17/17 07/04/2017  . IMPINGEMENT SYNDROME 03/09/2010  . CARPAL TUNNEL SYNDROME 07/05/2007  . HIGH BLOOD PRESSURE 07/04/2007    [name], PTA 08/12/2017, 11:49 AM  [facility] [address] [place], [place], [address] Phone: [phone]   Fax:  [phone]  Name: Kathryn Gallegos MRN: [id number] Date of Birth: [birth year]

## 2017-08-15 ENCOUNTER — Encounter: Payer: Self-pay | Admitting: Physical Therapy

## 2017-08-15 ENCOUNTER — Ambulatory Visit: Payer: 59 | Admitting: Physical Therapy

## 2017-08-15 DIAGNOSIS — M6281 Muscle weakness (generalized): Secondary | ICD-10-CM

## 2017-08-15 DIAGNOSIS — M25612 Stiffness of left shoulder, not elsewhere classified: Secondary | ICD-10-CM | POA: Diagnosis not present

## 2017-08-15 DIAGNOSIS — M25512 Pain in left shoulder: Secondary | ICD-10-CM

## 2017-08-15 NOTE — Therapy (Signed)
[place], [place], [address] Phone: [phone]   Fax:  [phone]  Physical Therapy Treatment  Patient Details  Name: Kathryn Gallegos MRN: [id number] Date of Birth: April 01, 1956 Referring Provider: Arther Abbott, MD   Encounter Date: 08/15/2017  PT End of Session - 08/15/17 0949    Visit Number  7    Number of Visits  18    Date for PT Re-Evaluation  08/30/17    Authorization Type  [employer]    PT Start Time  0936    PT Stop Time  1030    PT Time Calculation (min)  54 min       Past Medical History:  Diagnosis Date  . Anxiety   . Anxiety and depression   . Asthma   . Borderline diabetes   . Carpal tunnel syndrome 07/05/2007   Qualifier: Diagnosis of  By: [name], [name]    . HIGH BLOOD PRESSURE 07/04/2007   Qualifier: Diagnosis of  By: [name], [name]    . HTN (hypertension)   . Hyperglycemia   . Hyperlipidemia   . IMPINGEMENT SYNDROME 03/09/2010   Qualifier: Diagnosis of  By: [name], [name]    . Obesity   . Rapid palpitations   . Seasonal allergies     Past Surgical History:  Procedure Laterality Date  . TOTAL ABDOMINAL HYSTERECTOMY    . TUBAL LIGATION      There were no vitals filed for this visit.  Subjective Assessment - 08/15/17 0947    Subjective  Randolm Idol again today so achey.     Currently in Pain?  Yes    Pain Score  3     Pain Location  Shoulder    Pain Orientation  Left    Pain Descriptors / Indicators  Aching    Pain Type  Acute pain         [facility] PT Assessment - 08/15/17 0001      AROM   Left Shoulder Flexion  135 Degrees    Left Shoulder Internal Rotation  -- reach to T-12    Left Shoulder External Rotation  -- reach to T 2                   [facility] Adult PT Treatment/Exercise - 08/15/17 0001      Shoulder Exercises: Standing   Other Standing Exercises  UE ranger IR, and flexion on wall, wall slides bialteral       Shoulder  Exercises: Pulleys   Flexion  3 minutes    ABduction  1 minute      Shoulder Exercises: ROM/Strengthening   UBE (Upper Arm Bike)  L1 2.5 min forward and back = 5 min    Other ROM/Strengthening Exercises  scap retraction  red 2x10; shoulder extension 2x10     Other ROM/Strengthening Exercises  IR/ ER red band , punch red band, no increased pain 2x10 each      Shoulder Exercises: Stretch   Corner Stretch  3 reps;10 seconds    Wall Stretch - Flexion  5 reps;10 seconds      Manual Therapy   Manual therapy comments  --                  PT Long Term Goals - 08/10/17 1543      PT LONG TERM GOAL #1   Title  Pt will improve shoulder AROM to within 10degrees of normal  Time  6    Period  Weeks    Status  On-going      PT LONG TERM GOAL #2   Title  Pt will improve FOTO to 39% limited or less to demonstrate functional improvement    Time  6    Period  Weeks    Status  On-going      PT LONG TERM GOAL #3   Title  Pt will demonstrate L shoulder MMT of 4/5 or greater in all planes    Time  6    Period  Weeks    Status  On-going      PT LONG TERM GOAL #4   Title  Pt will be able to do her hair without limitations from the L UE    Time  6    Period  Weeks    Status  On-going            Plan - 08/15/17 1206    Clinical Impression Statement  Pt reports achiness today however demonstrates improved flexion and IR reaching today. Continued AAROM and red band strengthening with some discomfort with RTC strengthening. She is not using her LUE for hair care at this time sue to her current hairstyle. Slowly progressing toward goals.     PT Next Visit Plan  review HEP, begin L shoulder ROM and functional mobility.  modalities as needed, add bands to HEP     PT Home Exercise Plan  cross body stretch, wall ER stretch and corner overhead stretch, rockwood yellow band and scap rows, supine horizontal abudction yellow     Consulted and Agree with Plan of Care  Patient        Patient will benefit from skilled therapeutic intervention in order to improve the following deficits and impairments:  Decreased activity tolerance, Decreased strength, Pain, Decreased range of motion  Visit Diagnosis: Stiffness of left shoulder, not elsewhere classified  Acute pain of left shoulder  Muscle weakness (generalized)     Problem List Patient Active Problem List   Diagnosis Date Noted  . Closed fracture of left proximal humerus 06/17/17 07/04/2017  . IMPINGEMENT SYNDROME 03/09/2010  . CARPAL TUNNEL SYNDROME 07/05/2007  . HIGH BLOOD PRESSURE 07/04/2007    [name], PTA 08/15/2017, 12:12 PM  [facility] [address] [place], [place], [address] Phone: [phone]   Fax:  [phone]  Name: JALEI SHIBLEY MRN: [id number] Date of Birth: 14-Jan-1956

## 2017-08-17 ENCOUNTER — Ambulatory Visit (INDEPENDENT_AMBULATORY_CARE_PROVIDER_SITE_OTHER): Payer: 59 | Admitting: Orthopedic Surgery

## 2017-08-17 VITALS — BP 136/82 | HR 78 | Ht 64.0 in | Wt 182.0 lb

## 2017-08-17 DIAGNOSIS — S42295D Other nondisplaced fracture of upper end of left humerus, subsequent encounter for fracture with routine healing: Secondary | ICD-10-CM

## 2017-08-17 NOTE — Progress Notes (Signed)
Continue physical therapy return in 4 weeks fracture care follow-up  Chief Complaint  Patient presents with  . Follow-up    Recheck on left shoulder fracture, DOI 06-17-17. Follow up after PT.    Encounter Diagnosis  Name Primary?  . Other closed nondisplaced fracture of proximal end of left humerus with routine healing, subsequent encounter 06/17/17 Yes     Current Outpatient Medications:  .  albuterol (PROVENTIL HFA;VENTOLIN HFA) 108 (90 Base) MCG/ACT inhaler, Inhale 1 puff into the lungs every 6 (six) hours as needed for wheezing or shortness of breath., Disp: , Rfl:  .  cyclobenzaprine (FLEXERIL) 5 MG tablet, Take 2 tablets (10 mg total) by mouth 2 (two) times daily as needed for muscle spasms., Disp: 10 tablet, Rfl: 0 .  diltiazem (CARDIZEM) 30 MG tablet, Take 30 mg by mouth as needed (palpitations). Max 4 tablets with 24 hours, Disp: , Rfl:  .  esomeprazole (NEXIUM) 20 MG capsule, TAKE 1 TABLET BY MOUTH EVERY OTHER DAY , Disp: , Rfl:  .  HYDROcodone-acetaminophen (NORCO) 5-325 MG tablet, Take 1 tablet by mouth every 6 (six) hours as needed for moderate pain., Disp: 30 tablet, Rfl: 0 .  ibuprofen (ADVIL,MOTRIN) 800 MG tablet, Take 1 tablet (800 mg total) 3 (three) times daily with meals by mouth., Disp: 90 tablet, Rfl: 1 .  levocetirizine (XYZAL) 5 MG tablet, Take 5 mg by mouth every evening., Disp: , Rfl:  .  mupirocin cream (BACTROBAN) 2 %, Apply 1 application topically 2 (two) times daily., Disp: 15 g, Rfl: 0 .  Probiotic Product (PROBIOTIC DAILY PO), Take 1 capsule by mouth daily as needed (as directed). , Disp: , Rfl:  .  sertraline (ZOLOFT) 50 MG tablet, Take 50 mg by mouth daily. , Disp: , Rfl:   Current Facility-Administered Medications:  .  methylPREDNISolone acetate (DEPO-MEDROL) injection 40 mg, 40 mg, Intra-articular, Once, [name], MD  BP 136/82   Pulse 78   Ht 5\' 4"  (1.626 m)   Wt 182 lb (82.6 kg)   BMI 31.24 kg/m   Physical Exam  Physical exam  matches the PT notes below  She still has soreness and tenderness over the left proximal shoulder with decreased range of motion and weakness  [facility] PT Assessment - 08/15/17 0001              AROM    Left Shoulder Flexion  135 Degrees     Left Shoulder Internal Rotation  -- reach to T-12     Left Shoulder External Rotation  -- reach to T 2         Plan   Continue PT return in 4 weeks

## 2017-08-18 ENCOUNTER — Ambulatory Visit: Payer: 59 | Admitting: Physical Therapy

## 2017-08-19 ENCOUNTER — Encounter: Payer: Self-pay | Admitting: Physical Therapy

## 2017-08-19 ENCOUNTER — Ambulatory Visit: Payer: 59 | Attending: Orthopedic Surgery | Admitting: Physical Therapy

## 2017-08-19 DIAGNOSIS — M25512 Pain in left shoulder: Secondary | ICD-10-CM

## 2017-08-19 DIAGNOSIS — M25612 Stiffness of left shoulder, not elsewhere classified: Secondary | ICD-10-CM | POA: Insufficient documentation

## 2017-08-19 DIAGNOSIS — M6281 Muscle weakness (generalized): Secondary | ICD-10-CM | POA: Diagnosis present

## 2017-08-19 NOTE — Therapy (Signed)
[place], [place], [address] Phone: [phone]   Fax:  [phone]  Physical Therapy Treatment  Patient Details  Name: Kathryn Gallegos MRN: [id number] Date of Birth: 05-Sep-1955 Referring Provider: Arther Abbott, MD   Encounter Date: 08/19/2017  PT End of Session - 08/19/17 0915    Visit Number  8    Number of Visits  18    Date for PT Re-Evaluation  08/30/17    Authorization Type  UHC    PT Start Time  [phone] Patient 13 minutes later for appointment     PT Stop Time  0930    PT Time Calculation (min)  33 min    Activity Tolerance  Patient tolerated treatment well    Behavior During Therapy  [facility] for tasks assessed/performed       Past Medical History:  Diagnosis Date  . Anxiety   . Anxiety and depression   . Asthma   . Borderline diabetes   . Carpal tunnel syndrome 07/05/2007   Qualifier: Diagnosis of  By: [name], [name]    . HIGH BLOOD PRESSURE 07/04/2007   Qualifier: Diagnosis of  By: [name], [name]    . HTN (hypertension)   . Hyperglycemia   . Hyperlipidemia   . IMPINGEMENT SYNDROME 03/09/2010   Qualifier: Diagnosis of  By: [name], [name]    . Obesity   . Rapid palpitations   . Seasonal allergies     Past Surgical History:  Procedure Laterality Date  . TOTAL ABDOMINAL HYSTERECTOMY    . TUBAL LIGATION      There were no vitals filed for this visit.  Subjective Assessment - 08/19/17 0900    Subjective  Patient reports her shoulder has been pretty good yesterday. She has been to the MD who asked him not to lift over 5 lbs. He wants her to continue with POC.     Pertinent History  DM, asthma, HTN, high cholesterol    Diagnostic tests  xray on 11/19 showing stable fracture status    Patient Stated Goals  get the arm back, be able to raise the arm.      Currently in Pain?  No/denies                      [facility] Adult PT Treatment/Exercise - 08/19/17  0001      Shoulder Exercises: Supine   Horizontal ABduction  10 reps yellow    Other Supine Exercises  supine flexion x10       Shoulder Exercises: Standing   Other Standing Exercises  UE ranger IR, and flexion on wall, wall slides bialteral       Shoulder Exercises: Pulleys   Flexion  3 minutes      Shoulder Exercises: ROM/Strengthening   Other ROM/Strengthening Exercises  scap retraction  red 2x10; shoulder extension 2x10     Other ROM/Strengthening Exercises  IR/ ER red band , punch red band, no increased pain 2x10 each      Manual Therapy   Manual therapy comments  PROM Flexion, abduction, ER, IR              PT Education - 08/19/17 0901    Education provided  Yes    Education Details  reviewed exercisec     Person(s) Educated  Patient    Methods  Explanation;Demonstration;Tactile cues;Verbal cues    Comprehension  Verbalized understanding;Returned demonstration;Verbal cues required;Tactile cues required;Need further instruction  PT Long Term Goals - 08/10/17 1543      PT LONG TERM GOAL #1   Title  Pt will improve shoulder AROM to within 10degrees of normal     Time  6    Period  Weeks    Status  On-going      PT LONG TERM GOAL #2   Title  Pt will improve FOTO to 39% limited or less to demonstrate functional improvement    Time  6    Period  Weeks    Status  On-going      PT LONG TERM GOAL #3   Title  Pt will demonstrate L shoulder MMT of 4/5 or greater in all planes    Time  6    Period  Weeks    Status  On-going      PT LONG TERM GOAL #4   Title  Pt will be able to do her hair without limitations from the L UE    Time  6    Period  Weeks    Status  On-going            Plan - 08/19/17 0943    Clinical Impression Statement  Therapy added supine forward flexion with her shouder bent today. Her treatment was limited by time. She had no increase in pain. She continues to make progress.     Clinical Presentation  Stable    Clinical  Decision Making  Low    PT Frequency  3x / week    PT Duration  6 weeks    PT Treatment/Interventions  Electrical Stimulation;Cryotherapy;Moist Heat;Therapeutic exercise;Manual techniques;Passive range of motion    PT Next Visit Plan  review HEP, begin L shoulder ROM and functional mobility.  modalities as needed, add bands to HEP     PT Home Exercise Plan  cross body stretch, wall ER stretch and corner overhead stretch, rockwood yellow band and scap rows, supine horizontal abudction yellow     Consulted and Agree with Plan of Care  Patient       Patient will benefit from skilled therapeutic intervention in order to improve the following deficits and impairments:  Decreased activity tolerance, Decreased strength, Pain, Decreased range of motion  Visit Diagnosis: Stiffness of left shoulder, not elsewhere classified  Acute pain of left shoulder  Muscle weakness (generalized)     Problem List Patient Active Problem List   Diagnosis Date Noted  . Closed fracture of left proximal humerus 06/17/17 07/04/2017  . IMPINGEMENT SYNDROME 03/09/2010  . CARPAL TUNNEL SYNDROME 07/05/2007  . HIGH BLOOD PRESSURE 07/04/2007    [name]  PT DPT  08/19/2017, 12:05 PM  [facility] [address] [place], [place], [address] Phone: [phone]   Fax:  [phone]  Name: Kathryn Gallegos MRN: [id number] Date of Birth: 1956/07/07

## 2017-08-22 ENCOUNTER — Other Ambulatory Visit: Payer: Self-pay | Admitting: Obstetrics and Gynecology

## 2017-08-22 DIAGNOSIS — Z1231 Encounter for screening mammogram for malignant neoplasm of breast: Secondary | ICD-10-CM

## 2017-08-23 ENCOUNTER — Ambulatory Visit: Payer: 59 | Admitting: Physical Therapy

## 2017-08-24 ENCOUNTER — Ambulatory Visit: Payer: 59 | Admitting: Physical Therapy

## 2017-08-24 DIAGNOSIS — M25612 Stiffness of left shoulder, not elsewhere classified: Secondary | ICD-10-CM | POA: Diagnosis not present

## 2017-08-24 DIAGNOSIS — M25512 Pain in left shoulder: Secondary | ICD-10-CM

## 2017-08-24 DIAGNOSIS — M6281 Muscle weakness (generalized): Secondary | ICD-10-CM

## 2017-08-25 NOTE — Therapy (Signed)
[place], [place], [address] Phone: [phone]   Fax:  [phone]  Physical Therapy Treatment  Patient Details  Name: Kathryn Gallegos MRN: [id number] Date of Birth: 01-01-1956 Referring Provider: Arther Abbott, MD   Encounter Date: 08/24/2017  PT End of Session - 08/25/17 1030    Visit Number  9    Number of Visits  18    Date for PT Re-Evaluation  08/30/17    Authorization Type  UHC    PT Start Time  1507 Patient 7 min late     PT Stop Time  1545    PT Time Calculation (min)  38 min    Activity Tolerance  Patient tolerated treatment well    Behavior During Therapy  [facility] for tasks assessed/performed       Past Medical History:  Diagnosis Date  . Anxiety   . Anxiety and depression   . Asthma   . Borderline diabetes   . Carpal tunnel syndrome 07/05/2007   Qualifier: Diagnosis of  By: [name], [name]    . HIGH BLOOD PRESSURE 07/04/2007   Qualifier: Diagnosis of  By: [name], [name]    . HTN (hypertension)   . Hyperglycemia   . Hyperlipidemia   . IMPINGEMENT SYNDROME 03/09/2010   Qualifier: Diagnosis of  By: [name], [name]    . Obesity   . Rapid palpitations   . Seasonal allergies     Past Surgical History:  Procedure Laterality Date  . TOTAL ABDOMINAL HYSTERECTOMY    . TUBAL LIGATION      There were no vitals filed for this visit.  Subjective Assessment - 08/25/17 1031    Subjective  Patient reports some anterior soreness in her shoulder today. She piskced up her dog yesterday and worked on her hair for a long period today.     Pertinent History  DM, asthma, HTN, high cholesterol    Diagnostic tests  xray on 11/19 showing stable fracture status    Patient Stated Goals  get the arm back, be able to raise the arm.      Currently in Pain?  Yes    Pain Score  3     Pain Location  Shoulder    Pain Orientation  Left    Pain Descriptors / Indicators  Aching    Pain  Type  Acute pain    Pain Onset  More than a month ago    Pain Frequency  Constant    Aggravating Factors   using     Pain Relieving Factors  eating and meds     Effect of Pain on Daily Activities  unable to use L UE for mobility                               PT Education - 08/25/17 1032    Education provided  Yes    Education Details  technique with exercises     Person(s) Educated  Patient    Methods  Explanation;Demonstration;Tactile cues;Verbal cues    Comprehension  Verbalized understanding;Returned demonstration;Verbal cues required;Tactile cues required          PT Long Term Goals - 08/10/17 1543      PT LONG TERM GOAL #1   Title  Pt will improve shoulder AROM to within 10degrees of normal     Time  6    Period  Weeks  Status  On-going      PT LONG TERM GOAL #2   Title  Pt will improve FOTO to 39% limited or less to demonstrate functional improvement    Time  6    Period  Weeks    Status  On-going      PT LONG TERM GOAL #3   Title  Pt will demonstrate L shoulder MMT of 4/5 or greater in all planes    Time  6    Period  Weeks    Status  On-going      PT LONG TERM GOAL #4   Title  Pt will be able to do her hair without limitations from the L UE    Time  6    Period  Weeks    Status  On-going            Plan - 08/25/17 1033    Clinical Impression Statement  Patient was somewhat limited today by pain but overall she is making progress. She is nearing full shoulder ER. Therapy will continue to progress activity as tolerated.     Clinical Presentation  Stable    Clinical Decision Making  Low    Rehab Potential  Excellent    PT Frequency  3x / week    PT Duration  6 weeks    PT Treatment/Interventions  Electrical Stimulation;Cryotherapy;Moist Heat;Therapeutic exercise;Manual techniques;Passive range of motion    PT Next Visit Plan  review HEP, begin L shoulder ROM and functional mobility.  modalities as needed, add bands to HEP      PT Home Exercise Plan  cross body stretch, wall ER stretch and corner overhead stretch, rockwood yellow band and scap rows, supine horizontal abudction yellow     Consulted and Agree with Plan of Care  Patient       Patient will benefit from skilled therapeutic intervention in order to improve the following deficits and impairments:  Decreased activity tolerance, Decreased strength, Pain, Decreased range of motion  Visit Diagnosis: Stiffness of left shoulder, not elsewhere classified  Acute pain of left shoulder  Muscle weakness (generalized)     Problem List Patient Active Problem List   Diagnosis Date Noted  . Closed fracture of left proximal humerus 06/17/17 07/04/2017  . IMPINGEMENT SYNDROME 03/09/2010  . CARPAL TUNNEL SYNDROME 07/05/2007  . HIGH BLOOD PRESSURE 07/04/2007    [name] PT DPT  08/25/2017, 10:37 AM  [facility] [address] [place], [place], [address] Phone: [phone]   Fax:  [phone]  Name: ELSYE MCCOLLISTER MRN: [id number] Date of Birth: 1956/05/03

## 2017-08-29 ENCOUNTER — Encounter: Payer: Self-pay | Admitting: Physical Therapy

## 2017-08-29 ENCOUNTER — Ambulatory Visit: Payer: 59 | Admitting: Physical Therapy

## 2017-08-29 DIAGNOSIS — M25612 Stiffness of left shoulder, not elsewhere classified: Secondary | ICD-10-CM

## 2017-08-29 DIAGNOSIS — M25512 Pain in left shoulder: Secondary | ICD-10-CM

## 2017-08-29 DIAGNOSIS — M6281 Muscle weakness (generalized): Secondary | ICD-10-CM

## 2017-08-30 ENCOUNTER — Ambulatory Visit: Payer: 59 | Admitting: Physical Therapy

## 2017-08-30 ENCOUNTER — Encounter: Payer: Self-pay | Admitting: Physical Therapy

## 2017-08-30 DIAGNOSIS — M6281 Muscle weakness (generalized): Secondary | ICD-10-CM

## 2017-08-30 DIAGNOSIS — M25512 Pain in left shoulder: Secondary | ICD-10-CM

## 2017-08-30 DIAGNOSIS — M25612 Stiffness of left shoulder, not elsewhere classified: Secondary | ICD-10-CM

## 2017-08-30 NOTE — Therapy (Signed)
[place], [place], [address] Phone: [phone]   Fax:  [phone]  Physical Therapy Treatment  Patient Details  Name: Kathryn Gallegos MRN: [id number] Date of Birth: 06-Apr-1956 Referring Provider: Arther Abbott, MD   Encounter Date: 08/30/2017  PT End of Session - 08/30/17 1628    PT Start Time  1550    PT Stop Time  1638    PT Time Calculation (min)  48 min    Activity Tolerance  Patient tolerated treatment well    Behavior During Therapy  [facility] for tasks assessed/performed       Past Medical History:  Diagnosis Date  . Anxiety   . Anxiety and depression   . Asthma   . Borderline diabetes   . Carpal tunnel syndrome 07/05/2007   Qualifier: Diagnosis of  By: [name], [name]    . HIGH BLOOD PRESSURE 07/04/2007   Qualifier: Diagnosis of  By: [name], [name]    . HTN (hypertension)   . Hyperglycemia   . Hyperlipidemia   . IMPINGEMENT SYNDROME 03/09/2010   Qualifier: Diagnosis of  By: [name], [name]    . Obesity   . Rapid palpitations   . Seasonal allergies     Past Surgical History:  Procedure Laterality Date  . TOTAL ABDOMINAL HYSTERECTOMY    . TUBAL LIGATION      There were no vitals filed for this visit.  Subjective Assessment - 08/30/17 1554    Subjective  Patient reports the shoulder feels good today. She was not sore after the last visit.    Diagnostic tests  xray on 11/19 showing stable fracture status    Patient Stated Goals  get the arm back, be able to raise the arm.      Currently in Pain?  Yes    Pain Score  2     Pain Location  Shoulder    Pain Orientation  Left    Pain Descriptors / Indicators  Aching    Pain Type  Acute pain    Pain Onset  More than a month ago    Pain Frequency  Constant    Aggravating Factors   using the shouldrer     Pain Relieving Factors  medication          [facility] PT Assessment - 08/30/17 0001      PROM   Left Shoulder  Flexion  140 Degrees    Left Shoulder External Rotation  75 Degrees                  [facility] Adult PT Treatment/Exercise - 08/30/17 1605      Shoulder Exercises: Supine   Other Supine Exercises  small range flexion 1 lb 2x10      Shoulder Exercises: Standing   Other Standing Exercises  Shelf reach to second shelf x10 altternating shlefs x10;       Shoulder Exercises: Pulleys   Flexion  3 minutes      Shoulder Exercises: ROM/Strengthening   Other ROM/Strengthening Exercises  scap retraction  green 2x10; shoulder extension 2x10 green      Other ROM/Strengthening Exercises  IR green band 2x10; ER red band 2x10      Modalities   Modalities  Cryotherapy      Cryotherapy   Number Minutes Cryotherapy  10 Minutes    Cryotherapy Location  Shoulder    Type of Cryotherapy  Ice pack      Manual Therapy  Manual therapy comments  PROM Flexion, abduction, ER, IR              PT Education - 08/29/17 1524    Education provided  Yes    Education Details  reviewed technique with exercises     Person(s) Educated  Patient    Methods  Explanation;Demonstration;Verbal cues    Comprehension  Verbalized understanding;Returned demonstration;Verbal cues required;Tactile cues required          PT Long Term Goals - 08/30/17 1630      PT LONG TERM GOAL #1   Title  Pt will improve shoulder AROM to within 10degrees of normal     Time  6    Period  Weeks    Status  On-going      PT LONG TERM GOAL #2   Title  Pt will improve FOTO to 39% limited or less to demonstrate functional improvement    Time  6    Period  Weeks    Status  On-going      PT LONG TERM GOAL #3   Title  Pt will demonstrate L shoulder MMT of 4/5 or greater in all planes    Time  6    Period  Weeks    Status  On-going      PT LONG TERM GOAL #4   Title  Pt will be able to do her hair without limitations from the L UE    Time  6    Period  Weeks    Status  On-going            Plan - 08/30/17  1621    Clinical Impression Statement  Therapy added in standing shoulder flexion to second shelf and advanced weights with endurance training. She had no increase in pain. Flexion mesurments have improved to 140 passive. She will be seen again tomorrow.     Clinical Presentation  Stable    Clinical Decision Making  Low    Rehab Potential  Excellent    PT Frequency  3x / week    PT Duration  6 weeks    PT Treatment/Interventions  Electrical Stimulation;Cryotherapy;Moist Heat;Therapeutic exercise;Manual techniques;Passive range of motion    PT Next Visit Plan  review HEP, begin L shoulder ROM and functional mobility.  modalities as needed, add bands to HEP     PT Home Exercise Plan  cross body stretch, wall ER stretch and corner overhead stretch, rockwood yellow band and scap rows, supine horizontal abudction yellow     Consulted and Agree with Plan of Care  Patient       Patient will benefit from skilled therapeutic intervention in order to improve the following deficits and impairments:  Decreased activity tolerance, Decreased strength, Pain, Decreased range of motion  Visit Diagnosis: Stiffness of left shoulder, not elsewhere classified  Acute pain of left shoulder  Muscle weakness (generalized)     Problem List Patient Active Problem List   Diagnosis Date Noted  . Closed fracture of left proximal humerus 06/17/17 07/04/2017  . IMPINGEMENT SYNDROME 03/09/2010  . CARPAL TUNNEL SYNDROME 07/05/2007  . HIGH BLOOD PRESSURE 07/04/2007    [name]  PT DPT  08/30/2017, 4:31 PM  [facility] [address] [place], [place], [address] Phone: [phone]   Fax:  [phone]  Name: ARNIKA LARZELERE MRN: [id number] Date of Birth: Oct 07, 1955

## 2017-08-30 NOTE — Therapy (Signed)
Kathryn Gallegos, [place], [address] Phone: [phone]   Fax:  [phone]  Physical Therapy Treatment  Patient Details  Name: Kathryn Gallegos MRN: [id number] Date of Birth: [birth year] Referring Provider: Arther Abbott, MD   Encounter Date: 08/29/2017  PT End of Session - 08/29/17 1525    Visit Number  10    Number of Visits  18    Date for PT Re-Evaluation  08/30/17    Authorization Type  UHC    PT Start Time  3557    PT Stop Time  1545    PT Time Calculation (min)  38 min    Activity Tolerance  Patient tolerated treatment well    Behavior During Therapy  [facility] for tasks assessed/performed       Past Medical History:  Diagnosis Date  . Anxiety   . Anxiety and depression   . Asthma   . Borderline diabetes   . Carpal tunnel syndrome 07/05/2007   Qualifier: Diagnosis of  By: [name], [name]    . HIGH BLOOD PRESSURE 07/04/2007   Qualifier: Diagnosis of  By: [name], [name]    . HTN (hypertension)   . Hyperglycemia   . Hyperlipidemia   . IMPINGEMENT SYNDROME 03/09/2010   Qualifier: Diagnosis of  By: [name], [name]    . Obesity   . Rapid palpitations   . Seasonal allergies     Past Surgical History:  Procedure Laterality Date  . TOTAL ABDOMINAL HYSTERECTOMY    . TUBAL LIGATION      There were no vitals filed for this visit.  Subjective Assessment - 08/29/17 1522    Subjective  Patient reports her shoulder was a little sore yesterday but is not as sore today. She has been doing her exercises.     Pertinent History  DM, asthma, HTN, high cholesterol    Diagnostic tests  xray on 11/19 showing stable fracture status    Patient Stated Goals  get the arm back, be able to raise the arm.      Currently in Pain?  Yes    Pain Score  3     Pain Location  Shoulder    Pain Orientation  Left    Pain Descriptors / Indicators  Aching    Pain Type  Acute pain    Pain Onset  More than a  month ago    Pain Frequency  Constant    Aggravating Factors   using the shoulder     Pain Relieving Factors  medication     Effect of Pain on Daily Activities  unable to use L UE for mobility                       [facility] Adult PT Treatment/Exercise - 08/30/17 0001      Shoulder Exercises: Supine   Other Supine Exercises  supine flexion x10     Other Supine Exercises  small range flexion 1 lb 2x10      Shoulder Exercises: Standing   Other Standing Exercises  UE ranger flexion on wall, wall slides bialteral       Shoulder Exercises: Pulleys   Flexion  3 minutes      Shoulder Exercises: ROM/Strengthening   Other ROM/Strengthening Exercises  scap retraction  green 2x10; shoulder extension 2x10 green      Other ROM/Strengthening Exercises  IR/ ER red band , punch red band, no increased pain 2x10 each  Manual Therapy   Manual therapy comments  PROM Flexion, abduction, ER, IR              PT Education - 08/29/17 1524    Education provided  Yes    Education Details  reviewed technique with exercises     Person(s) Educated  Patient    Methods  Explanation;Demonstration;Verbal cues    Comprehension  Verbalized understanding;Returned demonstration;Verbal cues required;Tactile cues required          PT Long Term Goals - 08/29/17 1535      PT LONG TERM GOAL #1   Title  Pt will improve shoulder AROM to within 10degrees of normal     Time  6    Period  Weeks    Status  On-going      PT LONG TERM GOAL #2   Title  Pt will improve FOTO to 39% limited or less to demonstrate functional improvement    Time  6    Period  Weeks    Status  On-going      PT LONG TERM GOAL #3   Title  Pt will demonstrate L shoulder MMT of 4/5 or greater in all planes    Time  6    Period  Weeks    Status  On-going      PT LONG TERM GOAL #4   Title  Pt will be able to do her hair without limitations from the L UE    Time  6    Period  Weeks    Status  On-going             Plan - 08/29/17 1532    Clinical Impression Statement  Therapy able to advance bands with scpaular exercises. She has improved with her ability to reach over head supine. She will liley soon be able to progress to standing reaching. She had no increase in pain with treatment     Clinical Presentation  Stable    Clinical Decision Making  Low    Rehab Potential  Excellent    PT Frequency  3x / week    PT Duration  6 weeks    PT Treatment/Interventions  Electrical Stimulation;Cryotherapy;Moist Heat;Therapeutic exercise;Manual techniques;Passive range of motion    PT Next Visit Plan  review HEP, begin L shoulder ROM and functional mobility.  modalities as needed, add bands to HEP     PT Home Exercise Plan  cross body stretch, wall ER stretch and corner overhead stretch, rockwood yellow band and scap rows, supine horizontal abudction yellow     Consulted and Agree with Plan of Care  Patient       Patient will benefit from skilled therapeutic intervention in order to improve the following deficits and impairments:  Decreased activity tolerance, Decreased strength, Pain, Decreased range of motion  Visit Diagnosis: Stiffness of left shoulder, not elsewhere classified  Acute pain of left shoulder  Muscle weakness (generalized)     Problem List Patient Active Problem List   Diagnosis Date Noted  . Closed fracture of left proximal humerus 06/17/17 07/04/2017  . IMPINGEMENT SYNDROME 03/09/2010  . CARPAL TUNNEL SYNDROME 07/05/2007  . HIGH BLOOD PRESSURE 07/04/2007    [name]  PT DPT  08/30/2017, 3:36 PM  [facility] [address] [place], [place], [address] Phone: [phone]   Fax:  [phone]  Name: Kathryn Gallegos MRN: [id number] Date of Birth: 1955-11-26

## 2017-08-31 ENCOUNTER — Encounter: Payer: Self-pay | Admitting: Physical Therapy

## 2017-08-31 ENCOUNTER — Ambulatory Visit: Payer: 59 | Admitting: Physical Therapy

## 2017-08-31 DIAGNOSIS — M25612 Stiffness of left shoulder, not elsewhere classified: Secondary | ICD-10-CM

## 2017-08-31 DIAGNOSIS — M6281 Muscle weakness (generalized): Secondary | ICD-10-CM

## 2017-08-31 DIAGNOSIS — M25512 Pain in left shoulder: Secondary | ICD-10-CM

## 2017-09-01 ENCOUNTER — Encounter: Payer: Self-pay | Admitting: Physical Therapy

## 2017-09-01 NOTE — Therapy (Signed)
[place], [place], [address] Phone: [phone]   Fax:  [phone]  Physical Therapy Treatment  Patient Details  Name: Kathryn Gallegos MRN: [id number] Date of Birth: 12-09-1955 Referring Provider: Arther Abbott, MD   Encounter Date: 08/31/2017  PT End of Session - 08/31/17 1620    Visit Number  12    Number of Visits  18    Date for PT Re-Evaluation  08/30/17    Authorization Type  UHC    PT Start Time  0349    PT Stop Time  0430    PT Time Calculation (min)  41 min    Activity Tolerance  Patient tolerated treatment well    Behavior During Therapy  [facility] for tasks assessed/performed       Past Medical History:  Diagnosis Date  . Anxiety   . Anxiety and depression   . Asthma   . Borderline diabetes   . Carpal tunnel syndrome 07/05/2007   Qualifier: Diagnosis of  By: [name], [name]    . HIGH BLOOD PRESSURE 07/04/2007   Qualifier: Diagnosis of  By: [name], [name]    . HTN (hypertension)   . Hyperglycemia   . Hyperlipidemia   . IMPINGEMENT SYNDROME 03/09/2010   Qualifier: Diagnosis of  By: [name], [name]    . Obesity   . Rapid palpitations   . Seasonal allergies     Past Surgical History:  Procedure Laterality Date  . TOTAL ABDOMINAL HYSTERECTOMY    . TUBAL LIGATION      There were no vitals filed for this visit.  Subjective Assessment - 09/01/17 0952    Subjective  Patient was sore last night. She is not as sore today. She is having very little pain.     Diagnostic tests  xray on 11/19 showing stable fracture status    Patient Stated Goals  get the arm back, be able to raise the arm.      Currently in Pain?  Yes    Pain Score  1     Pain Location  Shoulder    Pain Orientation  Left    Pain Descriptors / Indicators  Aching    Pain Type  Acute pain    Pain Onset  More than a month ago    Pain Frequency  Constant    Aggravating Factors   US of the shoulder     Pain Relieving Factors  medication     Effect of Pain on Daily Activities  unable                       [facility] Adult PT Treatment/Exercise - 09/01/17 0001      Shoulder Exercises: Supine   Other Supine Exercises  small range flexion and ab/adduction  2x10 each       Shoulder Exercises: Pulleys   Flexion  3 minutes      Shoulder Exercises: ROM/Strengthening   Other ROM/Strengthening Exercises  scap retraction  green 2x10; shoulder extension 2x10 green      Other ROM/Strengthening Exercises  IR green band 2x10; ER red band 2x10      Modalities   Modalities  Cryotherapy      Cryotherapy   Number Minutes Cryotherapy  10 Minutes    Cryotherapy Location  Shoulder    Type of Cryotherapy  Ice pack      Manual Therapy   Manual therapy comments  PROM Flexion, abduction,  ER, IR              PT Education - 08/31/17 1620    Education provided  Yes    Education Details  symptom mangement     Person(s) Educated  Patient    Methods  Explanation;Demonstration;Tactile cues;Verbal cues    Comprehension  Verbalized understanding;Returned demonstration;Verbal cues required;Tactile cues required;Need further instruction          PT Long Term Goals - 08/30/17 1630      PT LONG TERM GOAL #1   Title  Pt will improve shoulder AROM to within 10degrees of normal     Time  6    Period  Weeks    Status  On-going      PT LONG TERM GOAL #2   Title  Pt will improve FOTO to 39% limited or less to demonstrate functional improvement    Time  6    Period  Weeks    Status  On-going      PT LONG TERM GOAL #3   Title  Pt will demonstrate L shoulder MMT of 4/5 or greater in all planes    Time  6    Period  Weeks    Status  On-going      PT LONG TERM GOAL #4   Title  Pt will be able to do her hair without limitations from the L UE    Time  6    Period  Weeks    Status  On-going            Plan - 08/31/17 1622    Clinical Impression Statement  Patient had her tird  visit in a row. Therapy backed off on theweights and standing exercises and focused on increasing raneg of motion. She continues to make good progress.     Clinical Presentation  Stable    Clinical Decision Making  Low    Rehab Potential  Excellent    PT Frequency  3x / week    PT Duration  6 weeks    PT Treatment/Interventions  Electrical Stimulation;Cryotherapy;Moist Heat;Therapeutic exercise;Manual techniques;Passive range of motion    PT Next Visit Plan  review HEP, begin L shoulder ROM and functional mobility.  modalities as needed, add bands to HEP     PT Home Exercise Plan  cross body stretch, wall ER stretch and corner overhead stretch, rockwood yellow band and scap rows, supine horizontal abudction yellow     Consulted and Agree with Plan of Care  Patient       Patient will benefit from skilled therapeutic intervention in order to improve the following deficits and impairments:  Decreased activity tolerance, Decreased strength, Pain, Decreased range of motion  Visit Diagnosis: Stiffness of left shoulder, not elsewhere classified  Acute pain of left shoulder  Muscle weakness (generalized)     Problem List Patient Active Problem List   Diagnosis Date Noted  . Closed fracture of left proximal humerus 06/17/17 07/04/2017  . IMPINGEMENT SYNDROME 03/09/2010  . CARPAL TUNNEL SYNDROME 07/05/2007  . HIGH BLOOD PRESSURE 07/04/2007    [name] PT DPT  09/01/2017, 9:56 AM  [facility] [address] [place], [place], [address] Phone: [phone]   Fax:  [phone]  Name: Kathryn Gallegos MRN: [id number] Date of Birth: 01/11/1956

## 2017-09-05 ENCOUNTER — Encounter: Payer: Self-pay | Admitting: Physical Therapy

## 2017-09-05 ENCOUNTER — Ambulatory Visit: Payer: 59 | Admitting: Physical Therapy

## 2017-09-05 DIAGNOSIS — M6281 Muscle weakness (generalized): Secondary | ICD-10-CM

## 2017-09-05 DIAGNOSIS — M25612 Stiffness of left shoulder, not elsewhere classified: Secondary | ICD-10-CM

## 2017-09-05 DIAGNOSIS — M25512 Pain in left shoulder: Secondary | ICD-10-CM

## 2017-09-05 NOTE — Therapy (Signed)
Kathryn Gallegos, [place], [address] Phone: [phone]   Fax:  [phone]  Physical Therapy Treatment  Patient Details  Name: Kathryn Gallegos MRN: [id number] Date of Birth: 1956/05/01 Referring Provider: Arther Abbott, MD   Encounter Date: 09/05/2017  PT End of Session - 09/05/17 1513    Visit Number  13    Number of Visits  18    Date for PT Re-Evaluation  08/30/17    Authorization Type  UHC    PT Start Time  1504    PT Stop Time  1545    PT Time Calculation (min)  41 min    Activity Tolerance  Patient tolerated treatment well    Behavior During Therapy  [facility] for tasks assessed/performed       Past Medical History:  Diagnosis Date  . Anxiety   . Anxiety and depression   . Asthma   . Borderline diabetes   . Carpal tunnel syndrome 07/05/2007   Qualifier: Diagnosis of  By: [name], [name]    . HIGH BLOOD PRESSURE 07/04/2007   Qualifier: Diagnosis of  By: [name], [name]    . HTN (hypertension)   . Hyperglycemia   . Hyperlipidemia   . IMPINGEMENT SYNDROME 03/09/2010   Qualifier: Diagnosis of  By: [name], [name]    . Obesity   . Rapid palpitations   . Seasonal allergies     Past Surgical History:  Procedure Laterality Date  . TOTAL ABDOMINAL HYSTERECTOMY    . TUBAL LIGATION      There were no vitals filed for this visit.  Subjective Assessment - 09/05/17 1508    Subjective  Patient was a little sore around were the fracture was. She thinks it may be because of the cold.  She reports overall her shoulder is feeling good.     Pertinent History  DM, asthma, HTN, high cholesterol    Diagnostic tests  xray on 11/19 showing stable fracture status    Patient Stated Goals  get the arm back, be able to raise the arm.      Currently in Pain?  Yes    Pain Score  5     Pain Location  Shoulder    Pain Orientation  Left    Pain Descriptors / Indicators  Aching    Pain Onset  More  than a month ago    Pain Frequency  Constant    Aggravating Factors   use of the shoulder    Pain Relieving Factors  medication     Effect of Pain on Daily Activities  unable     Multiple Pain Sites  No                      [facility] Adult PT Treatment/Exercise - 09/05/17 0001      Shoulder Exercises: Supine   Other Supine Exercises  supine flexion 2x10 needs to do a press to start; supine low range d2 flexion       Shoulder Exercises: Sidelying   Other Sidelying Exercises  sidelying ER 2x10       Shoulder Exercises: Standing   Other Standing Exercises  Shelf reach to second shelf 2x10 1lb       Shoulder Exercises: Pulleys   Flexion  3 minutes      Shoulder Exercises: ROM/Strengthening   Other ROM/Strengthening Exercises  scap retraction  green 2x10; shoulder extension 2x10 green  Other ROM/Strengthening Exercises  IR green band 2x10; ER red band 2x10      Modalities   Modalities  Cryotherapy      Cryotherapy   Number Minutes Cryotherapy  10 Minutes    Cryotherapy Location  Shoulder    Type of Cryotherapy  Ice pack      Manual Therapy   Manual therapy comments  PROM Flexion, abduction, ER, IR              PT Education - 09/05/17 1511    Education provided  Yes    Education Details  tecnique with exercises    Person(s) Educated  Patient    Methods  Explanation    Comprehension  Returned demonstration;Verbalized understanding;Verbal cues required;Tactile cues required          PT Long Term Goals - 09/05/17 1526      PT LONG TERM GOAL #1   Title  Pt will improve shoulder AROM to within 10degrees of normal     Baseline  patient is progressing     Time  6    Period  Weeks    Status  On-going      PT LONG TERM GOAL #2   Title  Pt will improve FOTO to 39% limited or less to demonstrate functional improvement    Time  6    Period  Weeks    Status  On-going      PT LONG TERM GOAL #3   Title  Pt will demonstrate L shoulder MMT of 4/5 or  greater in all planes    Time  6    Period  Weeks    Status  On-going      PT LONG TERM GOAL #4   Title  Pt will be able to do her hair without limitations from the L UE    Time  6    Period  Weeks    Status  On-going            Plan - 09/05/17 1523    Clinical Impression Statement  Patient tolerated exercises well. Therapy review2ed standing flexion exercises. She ddi well. depsite some initial sorenessshe had no increase in pain with treatment. She has end range ER and mild tightness with passive flexion today.     Clinical Presentation  Stable    Clinical Decision Making  Low    Rehab Potential  Excellent    PT Frequency  3x / week    PT Duration  6 weeks    PT Treatment/Interventions  Electrical Stimulation;Cryotherapy;Moist Heat;Therapeutic exercise;Manual techniques;Passive range of motion    PT Home Exercise Plan  cross body stretch, wall ER stretch and corner overhead stretch, rockwood yellow band and scap rows, supine horizontal abudction yellow     Consulted and Agree with Plan of Care  Patient       Patient will benefit from skilled therapeutic intervention in order to improve the following deficits and impairments:  Decreased activity tolerance, Decreased strength, Pain, Decreased range of motion  Visit Diagnosis: Stiffness of left shoulder, not elsewhere classified  Acute pain of left shoulder  Muscle weakness (generalized)     Problem List Patient Active Problem List   Diagnosis Date Noted  . Closed fracture of left proximal humerus 06/17/17 07/04/2017  . IMPINGEMENT SYNDROME 03/09/2010  . CARPAL TUNNEL SYNDROME 07/05/2007  . HIGH BLOOD PRESSURE 07/04/2007    [name] 09/05/2017, 4:17 PM  [place]  [place], [place], [address] Phone: [phone]   Fax:  [phone]  Name: Kathryn Gallegos MRN: [id number] Date of Birth: 1956/01/22

## 2017-09-07 ENCOUNTER — Ambulatory Visit: Payer: 59 | Admitting: Physical Therapy

## 2017-09-07 ENCOUNTER — Encounter: Payer: Self-pay | Admitting: Physical Therapy

## 2017-09-07 DIAGNOSIS — M6281 Muscle weakness (generalized): Secondary | ICD-10-CM

## 2017-09-07 DIAGNOSIS — M25512 Pain in left shoulder: Secondary | ICD-10-CM

## 2017-09-07 DIAGNOSIS — M25612 Stiffness of left shoulder, not elsewhere classified: Secondary | ICD-10-CM | POA: Diagnosis not present

## 2017-09-08 ENCOUNTER — Encounter: Payer: Self-pay | Admitting: Physical Therapy

## 2017-09-08 ENCOUNTER — Ambulatory Visit: Payer: 59 | Admitting: Physical Therapy

## 2017-09-08 DIAGNOSIS — M25612 Stiffness of left shoulder, not elsewhere classified: Secondary | ICD-10-CM | POA: Diagnosis not present

## 2017-09-08 DIAGNOSIS — M25512 Pain in left shoulder: Secondary | ICD-10-CM

## 2017-09-08 DIAGNOSIS — M6281 Muscle weakness (generalized): Secondary | ICD-10-CM

## 2017-09-08 NOTE — Therapy (Signed)
[place], [place], [address] Phone: [phone]   Fax:  [phone]  Physical Therapy Treatment  Patient Details  Name: Kathryn Gallegos MRN: [id number] Date of Birth: 1956/04/23 Referring Provider: Arther Abbott, MD   Encounter Date: 09/07/2017  PT End of Session - 09/07/17 1613    Visit Number  14    Number of Visits  18    Date for PT Re-Evaluation  10/05/17    Authorization Type  UHC    PT Start Time  1600    PT Stop Time  1630    PT Time Calculation (min)  30 min    Activity Tolerance  Patient tolerated treatment well    Behavior During Therapy  [facility] for tasks assessed/performed       Past Medical History:  Diagnosis Date  . Anxiety   . Anxiety and depression   . Asthma   . Borderline diabetes   . Carpal tunnel syndrome 07/05/2007   Qualifier: Diagnosis of  By: [name], [name]    . HIGH BLOOD PRESSURE 07/04/2007   Qualifier: Diagnosis of  By: [name], [name]    . HTN (hypertension)   . Hyperglycemia   . Hyperlipidemia   . IMPINGEMENT SYNDROME 03/09/2010   Qualifier: Diagnosis of  By: [name], [name]    . Obesity   . Rapid palpitations   . Seasonal allergies     Past Surgical History:  Procedure Laterality Date  . TOTAL ABDOMINAL HYSTERECTOMY    . TUBAL LIGATION      There were no vitals filed for this visit.  Subjective Assessment - 09/07/17 1603    Subjective  Patient was 15 minutes late for appointment. She has no complaints. She feels like her shoulder is getting stronger.     Pertinent History  DM, asthma, HTN, high cholesterol    Diagnostic tests  xray on 11/19 showing stable fracture status    Patient Stated Goals  get the arm back, be able to raise the arm.      Currently in Pain?  Yes    Pain Score  2     Pain Location  Shoulder    Pain Orientation  Left    Pain Type  Acute pain    Pain Onset  More than a month ago    Pain Frequency  Constant    Aggravating Factors   use of the shoulder     Pain Relieving Factors  medication     Effect of Pain on Daily Activities  unable                               PT Education - 09/08/17 1523    Education provided  Yes    Education Details  reviewed how to use UBE     Person(s) Educated  Patient    Methods  Explanation;Demonstration;Verbal cues;Tactile cues    Comprehension  Verbalized understanding;Returned demonstration;Verbal cues required;Tactile cues required;Need further instruction          PT Long Term Goals - 09/05/17 1526      PT LONG TERM GOAL #1   Title  Pt will improve shoulder AROM to within 10degrees of normal     Baseline  patient is progressing     Time  6    Period  Weeks    Status  On-going      PT LONG TERM  GOAL #2   Title  Pt will improve FOTO to 39% limited or less to demonstrate functional improvement    Time  6    Period  Weeks    Status  On-going      PT LONG TERM GOAL #3   Title  Pt will demonstrate L shoulder MMT of 4/5 or greater in all planes    Time  6    Period  Weeks    Status  On-going      PT LONG TERM GOAL #4   Title  Pt will be able to do her hair without limitations from the L UE    Time  6    Period  Weeks    Status  On-going            Plan - 09/08/17 1524    Clinical Impression Statement  Therapy added in UBE without pain. Patient is moving through ewxercises very well. Her range is at end range. she is reaching up to a shelf with weight. Patient will see MD next week with likely discharge. Her FOTO score has improved.     Clinical Presentation  Stable    Clinical Decision Making  Low    Rehab Potential  Excellent    PT Frequency  3x / week    PT Duration  6 weeks    PT Treatment/Interventions  Electrical Stimulation;Cryotherapy;Moist Heat;Therapeutic exercise;Manual techniques;Passive range of motion    PT Next Visit Plan  review HEP, begin L shoulder ROM and functional mobility.  modalities as  needed, add bands to HEP     PT Home Exercise Plan  cross body stretch, wall ER stretch and corner overhead stretch, rockwood yellow band and scap rows, supine horizontal abudction yellow     Consulted and Agree with Plan of Care  Patient       Patient will benefit from skilled therapeutic intervention in order to improve the following deficits and impairments:  Decreased activity tolerance, Decreased strength, Pain, Decreased range of motion  Visit Diagnosis: Stiffness of left shoulder, not elsewhere classified  Acute pain of left shoulder  Muscle weakness (generalized)     Problem List Patient Active Problem List   Diagnosis Date Noted  . Closed fracture of left proximal humerus 06/17/17 07/04/2017  . IMPINGEMENT SYNDROME 03/09/2010  . CARPAL TUNNEL SYNDROME 07/05/2007  . HIGH BLOOD PRESSURE 07/04/2007    [name] 09/08/2017, 3:27 PM  [facility] [address] [place], [place], [address] Phone: [phone]   Fax:  [phone]  Name: Kathryn Gallegos MRN: [id number] Date of Birth: 03-13-1956

## 2017-09-09 NOTE — Therapy (Signed)
[place], [place], [address] Phone: [phone]   Fax:  [phone]  Physical Therapy Treatment  Patient Details  Name: Kathryn Gallegos MRN: [id number] Date of Birth: 1956-06-09 Referring Provider: Arther Abbott, MD   Encounter Date: 09/08/2017  PT End of Session - 09/08/17 1558    Visit Number  15    Number of Visits  18    Date for PT Re-Evaluation  10/05/17    Authorization Type  [employer]    PT Start Time  1552 Patient 7 minutes late    PT Stop Time  1630    PT Time Calculation (min)  38 min    Activity Tolerance  Patient tolerated treatment well    Behavior During Therapy  [facility] for tasks assessed/performed       Past Medical History:  Diagnosis Date  . Anxiety   . Anxiety and depression   . Asthma   . Borderline diabetes   . Carpal tunnel syndrome 07/05/2007   Qualifier: Diagnosis of  By: [name], [name]    . HIGH BLOOD PRESSURE 07/04/2007   Qualifier: Diagnosis of  By: [name], [name]    . HTN (hypertension)   . Hyperglycemia   . Hyperlipidemia   . IMPINGEMENT SYNDROME 03/09/2010   Qualifier: Diagnosis of  By: [name], [name]    . Obesity   . Rapid palpitations   . Seasonal allergies     Past Surgical History:  Procedure Laterality Date  . TOTAL ABDOMINAL HYSTERECTOMY    . TUBAL LIGATION      There were no vitals filed for this visit.  Subjective Assessment - 09/08/17 1556    Subjective  Patient continues to have no complaints. She was 7 minutes late.     Pertinent History  DM, asthma, HTN, high cholesterol    Diagnostic tests  xray on 11/19 showing stable fracture status    Patient Stated Goals  get the arm back, be able to raise the arm.      Currently in Pain?  No/denies    Pain Score  2     Pain Location  Shoulder    Pain Orientation  Left    Pain Descriptors / Indicators  Aching    Pain Type  Acute pain    Pain Onset  More than a month ago    Pain  Frequency  Constant    Aggravating Factors   use of the shoulder     Pain Relieving Factors  medication     Effect of Pain on Daily Activities  unable                       [facility] Adult PT Treatment/Exercise - 09/09/17 0001      Shoulder Exercises: Supine   Other Supine Exercises  supine flexion 2x10 needs to do a press to start; supine low range d2 flexion       Shoulder Exercises: Sidelying   Other Sidelying Exercises  sidelying ER 2x10       Shoulder Exercises: Standing   External Rotation  Left;20 reps;Theraband    Theraband Level (Shoulder External Rotation)  Level 2 (Red)    Internal Rotation  Left;20 reps;Theraband    Theraband Level (Shoulder Internal Rotation)  Level 3 (Green)    Row  20 reps;Theraband    Theraband Level (Shoulder Row)  Level 3 (Green)    Retraction  20 reps;Theraband    Theraband  Level (Shoulder Retraction)  Level 3 (Green)    Other Standing Exercises  Shelf reach to second shelf 2x10 1lb       Shoulder Exercises: Pulleys   Flexion  3 minutes      Shoulder Exercises: ROM/Strengthening   Other ROM/Strengthening Exercises  scap retraction  green 2x10; shoulder extension 2x10 green      Other ROM/Strengthening Exercises  IR green band 2x10; ER red band 2x10      Modalities   Modalities  Cryotherapy      Cryotherapy   Cryotherapy Location  Shoulder      Manual Therapy   Manual therapy comments  PROM Flexion, abduction, ER, IR              PT Education - 09/08/17 1557    Education provided  Yes    Education Details  reviewed symptom management     Person(s) Educated  Patient    Methods  Explanation;Demonstration;Tactile cues;Verbal cues    Comprehension  Verbalized understanding;Returned demonstration;Verbal cues required;Tactile cues required          PT Long Term Goals - 09/05/17 1526      PT LONG TERM GOAL #1   Title  Pt will improve shoulder AROM to within 10degrees of normal     Baseline  patient is progressing      Time  6    Period  Weeks    Status  On-going      PT LONG TERM GOAL #2   Title  Pt will improve FOTO to 39% limited or less to demonstrate functional improvement    Time  6    Period  Weeks    Status  On-going      PT LONG TERM GOAL #3   Title  Pt will demonstrate L shoulder MMT of 4/5 or greater in all planes    Time  6    Period  Weeks    Status  On-going      PT LONG TERM GOAL #4   Title  Pt will be able to do her hair without limitations from the L UE    Time  6    Period  Weeks    Status  On-going            Plan - 09/08/17 1559    Clinical Impression Statement  Patient continues to tolerate treatment well. PROM is at end range. Patient will have 1 more visit before her return to the MD next week.     Clinical Presentation  Stable    Clinical Decision Making  Low    Rehab Potential  Excellent    PT Frequency  3x / week    PT Duration  6 weeks    PT Treatment/Interventions  Electrical Stimulation;Cryotherapy;Moist Heat;Therapeutic exercise;Manual techniques;Passive range of motion    PT Next Visit Plan  review HEP, begin L shoulder ROM and functional mobility.  modalities as needed, add bands to HEP     PT Home Exercise Plan  cross body stretch, wall ER stretch and corner overhead stretch, rockwood yellow band and scap rows, supine horizontal abudction yellow     Consulted and Agree with Plan of Care  Patient       Patient will benefit from skilled therapeutic intervention in order to improve the following deficits and impairments:  Decreased activity tolerance, Decreased strength, Pain, Decreased range of motion  Visit Diagnosis: Stiffness of left shoulder, not elsewhere classified  Acute pain of left  shoulder  Muscle weakness (generalized)     Problem List Patient Active Problem List   Diagnosis Date Noted  . Closed fracture of left proximal humerus 06/17/17 07/04/2017  . IMPINGEMENT SYNDROME 03/09/2010  . CARPAL TUNNEL SYNDROME 07/05/2007  . HIGH  BLOOD PRESSURE 07/04/2007    [name] PT DPT  09/09/2017, 1:49 PM  [facility] [address] [place], [place], [address] Phone: [phone]   Fax:  [phone]  Name: Kathryn Gallegos MRN: [id number] Date of Birth: 1955/10/12

## 2017-09-12 ENCOUNTER — Encounter: Payer: Self-pay | Admitting: Physical Therapy

## 2017-09-12 ENCOUNTER — Ambulatory Visit: Payer: 59 | Admitting: Physical Therapy

## 2017-09-12 DIAGNOSIS — M25612 Stiffness of left shoulder, not elsewhere classified: Secondary | ICD-10-CM | POA: Diagnosis not present

## 2017-09-12 DIAGNOSIS — M25512 Pain in left shoulder: Secondary | ICD-10-CM

## 2017-09-12 DIAGNOSIS — M6281 Muscle weakness (generalized): Secondary | ICD-10-CM

## 2017-09-13 NOTE — Therapy (Signed)
[place], [place], [address] Phone: [phone]   Fax:  [phone]  Physical Therapy Treatment  Patient Details  Name: Kathryn Gallegos MRN: [id number] Date of Birth: Jul 24, 1956 Referring Provider: Arther Abbott, MD   Encounter Date: 09/12/2017  PT End of Session - 09/12/17 1638    Visit Number  16    Number of Visits  18    Date for PT Re-Evaluation  10/05/17    Authorization Type  UHC    PT Start Time  0400    PT Stop Time  0449    PT Time Calculation (min)  49 min    Activity Tolerance  Patient tolerated treatment well    Behavior During Therapy  [facility] for tasks assessed/performed       Past Medical History:  Diagnosis Date  . Anxiety   . Anxiety and depression   . Asthma   . Borderline diabetes   . Carpal tunnel syndrome 07/05/2007   Qualifier: Diagnosis of  By: [name], [name]    . HIGH BLOOD PRESSURE 07/04/2007   Qualifier: Diagnosis of  By: [name], [name]    . HTN (hypertension)   . Hyperglycemia   . Hyperlipidemia   . IMPINGEMENT SYNDROME 03/09/2010   Qualifier: Diagnosis of  By: [name], [name]    . Obesity   . Rapid palpitations   . Seasonal allergies     Past Surgical History:  Procedure Laterality Date  . TOTAL ABDOMINAL HYSTERECTOMY    . TUBAL LIGATION      There were no vitals filed for this visit.  Subjective Assessment - 09/12/17 1603    Subjective  Patient was 15 minutes late for this appointment. She is reporting some pain today and over the weekend. She has focal swelling on her elbow today. She reports she did not notice this before. It is a good sized pocket of fluid on the left.     Pertinent History  DM, asthma, HTN, high cholesterol    Diagnostic tests  xray on 11/19 showing stable fracture status    Patient Stated Goals  get the arm back, be able to raise the arm.      Pain Score  2     Pain Location  Shoulder    Pain Orientation  Left     Pain Descriptors / Indicators  Aching    Pain Type  Acute pain    Pain Onset  More than a month ago    Pain Frequency  Constant    Aggravating Factors   use of the shoulder     Pain Relieving Factors  medication     Effect of Pain on Daily Activities  unable                       [facility] Adult PT Treatment/Exercise - 09/13/17 0001      Shoulder Exercises: Supine   Other Supine Exercises  supine flexion 2x10 needs to do a press to start; supine low range d2 flexion  2lb       Shoulder Exercises: Sidelying   Other Sidelying Exercises  sidelying ER 2x10       Shoulder Exercises: Standing   External Rotation  Left;20 reps;Theraband    Theraband Level (Shoulder External Rotation)  Level 2 (Red)    Internal Rotation  Left;20 reps;Theraband    Theraband Level (Shoulder Internal Rotation)  Level 3 (Green)    Row  20 reps;Theraband    Theraband Level (Shoulder Row)  Level 3 (Green)    Retraction  20 reps;Theraband    Theraband Level (Shoulder Retraction)  Level 3 (Green)    Other Standing Exercises  Shelf reach to second shelf 2x10 2lb       Shoulder Exercises: Pulleys   Flexion  3 minutes      Shoulder Exercises: ROM/Strengthening   Other ROM/Strengthening Exercises  scap retraction  green 2x10; shoulder extension 2x10 green      Other ROM/Strengthening Exercises  IR green band 2x10; ER red band 2x10      Modalities   Modalities  Cryotherapy      Cryotherapy   Number Minutes Cryotherapy  10 Minutes    Cryotherapy Location  Shoulder    Type of Cryotherapy  Ice pack      Manual Therapy   Manual therapy comments  PROM Flexion, abduction, ER, IR              PT Education - 09/12/17 1605    Education provided  Yes    Education Details  advised to make sure the MD looks at her elbow     Person(s) Educated  Patient    Methods  Demonstration;Explanation    Comprehension  Verbalized understanding;Returned demonstration;Verbal cues required;Tactile cues  required          PT Long Term Goals - 09/05/17 1526      PT LONG TERM GOAL #1   Title  Pt will improve shoulder AROM to within 10degrees of normal     Baseline  patient is progressing     Time  6    Period  Weeks    Status  On-going      PT LONG TERM GOAL #2   Title  Pt will improve FOTO to 39% limited or less to demonstrate functional improvement    Time  6    Period  Weeks    Status  On-going      PT LONG TERM GOAL #3   Title  Pt will demonstrate L shoulder MMT of 4/5 or greater in all planes    Time  6    Period  Weeks    Status  On-going      PT LONG TERM GOAL #4   Title  Pt will be able to do her hair without limitations from the L UE    Time  6    Period  Weeks    Status  On-going            Plan - 09/12/17 1643    Clinical Impression Statement  The patient had minor soreness today but was able to complete all exercises. She has full passive range of motion and is nearing ful activie ROM. She has minor limitations in strength but she has a full exercise program to work on at home. Her functional mobility has improved significantly. She can reach behind her head and behind her back. Today she noticed that she is having sweeling in her elbow though. It does not seem to be effecting her shoulder. She will see the MD on Wednesday. Therapy will see her Thursday with a tentative plan of discharge to HEP. The patient has made good progress. She has reached her goal for FOTO. See belo for goals specific progress.,     Clinical Presentation  Stable    Clinical Decision Making  Low    Rehab Potential  Excellent    PT  Frequency  3x / week    PT Duration  6 weeks    PT Treatment/Interventions  Electrical Stimulation;Cryotherapy;Moist Heat;Therapeutic exercise;Manual techniques;Passive range of motion    PT Next Visit Plan  review HEP, begin L shoulder ROM and functional mobility.  modalities as needed, add bands to HEP     PT Home Exercise Plan  cross body stretch, wall  ER stretch and corner overhead stretch, rockwood yellow band and scap rows, supine horizontal abudction yellow     Consulted and Agree with Plan of Care  Patient       Patient will benefit from skilled therapeutic intervention in order to improve the following deficits and impairments:  Decreased activity tolerance, Decreased strength, Pain, Decreased range of motion  Visit Diagnosis: Stiffness of left shoulder, not elsewhere classified  Acute pain of left shoulder  Muscle weakness (generalized)     Problem List Patient Active Problem List   Diagnosis Date Noted  . Closed fracture of left proximal humerus 06/17/17 07/04/2017  . IMPINGEMENT SYNDROME 03/09/2010  . CARPAL TUNNEL SYNDROME 07/05/2007  . HIGH BLOOD PRESSURE 07/04/2007    [name] PT DPT  09/13/2017, 11:51 AM  [facility] [address] [place], [place], [address] Phone: [phone]   Fax:  [phone]  Name: Kathryn Gallegos MRN: [id number] Date of Birth: January 31, 1956

## 2017-09-14 ENCOUNTER — Ambulatory Visit (INDEPENDENT_AMBULATORY_CARE_PROVIDER_SITE_OTHER): Payer: 59 | Admitting: Orthopedic Surgery

## 2017-09-14 ENCOUNTER — Encounter: Payer: 59 | Admitting: Physical Therapy

## 2017-09-14 VITALS — BP 145/88 | HR 86 | Ht 64.0 in | Wt 182.0 lb

## 2017-09-14 DIAGNOSIS — S42295D Other nondisplaced fracture of upper end of left humerus, subsequent encounter for fracture with routine healing: Secondary | ICD-10-CM

## 2017-09-14 DIAGNOSIS — M7022 Olecranon bursitis, left elbow: Secondary | ICD-10-CM

## 2017-09-14 NOTE — Progress Notes (Signed)
Chief Complaint  Patient presents with  . Follow-up    Recheck on left shoulder. DOI 06-17-17.   New problem swelling left elbow  Addressing the left shoulder first  The patient continues to make improvement in her left shoulder just having mild to moderate pain when the weather is cold or if she overdoes it  Therapy notes indicate excellent progress  Currently her flexion arc is 150 degrees flexion,  110 degrees straight abduction and with her arm at her side 45 degrees external rotation mild weakness of the rotator cuff  Recommend continue therapy for strengthening convert to a home program when completes 18 visits of therapy  New chief complaint swelling left elbow  62 year old female presents with spontaneous onset of swelling over the left elbow with no other history of trauma.  She does not report any erythema  Review of systems no fever chills or skin lesions over the arm or elbow  BP (!) 145/88   Pulse 86   Ht 5\' 4"  (1.626 m)   Wt 182 lb (82.6 kg)   BMI 31.24 kg/m   Physical Exam  Constitutional: She is oriented to person, place, and time. She appears well-developed and well-nourished.  Musculoskeletal:       Left elbow: She exhibits swelling. She exhibits normal range of motion, no effusion, no deformity and no laceration. Tenderness found.       Arms: Neurological: She is alert and oriented to person, place, and time.  Psychiatric: She has a normal mood and affect. Judgment normal.  Vitals reviewed.   Procedure aspiration left olecranon bursa:  Verbal consent was given timeout completed to confirm site of aspiration.  Skin was cleaned with alcohol sprayed with ethyl chloride and an 18-gauge needle was used to aspirate the fluid  Aspiration 6 cc of amber colored fluid  Tolerated without complication  She will see me in a month to check her shoulder she will complete her physical therapy

## 2017-09-15 ENCOUNTER — Ambulatory Visit: Payer: 59 | Admitting: Physical Therapy

## 2017-09-15 ENCOUNTER — Encounter: Payer: Self-pay | Admitting: Physical Therapy

## 2017-09-15 DIAGNOSIS — M6281 Muscle weakness (generalized): Secondary | ICD-10-CM

## 2017-09-15 DIAGNOSIS — M25612 Stiffness of left shoulder, not elsewhere classified: Secondary | ICD-10-CM

## 2017-09-15 DIAGNOSIS — M25512 Pain in left shoulder: Secondary | ICD-10-CM

## 2017-09-16 NOTE — Therapy (Addendum)
[place], [place], [address] Phone: [phone]   Fax:  [phone]  Physical Therapy Treatment/ Discharge  Patient Details  Name: Kathryn Gallegos MRN: [id number] Date of Birth: 1955-10-20 Referring Provider: Arther Abbott, MD   Encounter Date: 09/15/2017  PT End of Session - 09/15/17 1620    Visit Number  17    Number of Visits  18    Date for PT Re-Evaluation  10/05/17    Authorization Type  [employer]HC    PT Start Time  9211    PT Stop Time  1630    PT Time Calculation (min)  39 min    Activity Tolerance  Patient tolerated treatment well    Behavior During Therapy  [facility] for tasks assessed/performed       Past Medical History:  Diagnosis Date  . Anxiety   . Anxiety and depression   . Asthma   . Borderline diabetes   . Carpal tunnel syndrome 07/05/2007   Qualifier: Diagnosis of  By: [name], [name]    . HIGH BLOOD PRESSURE 07/04/2007   Qualifier: Diagnosis of  By: [name], [name]    . HTN (hypertension)   . Hyperglycemia   . Hyperlipidemia   . IMPINGEMENT SYNDROME 03/09/2010   Qualifier: Diagnosis of  By: [name], [name]    . Obesity   . Rapid palpitations   . Seasonal allergies     Past Surgical History:  Procedure Laterality Date  . TOTAL ABDOMINAL HYSTERECTOMY    . TUBAL LIGATION      There were no vitals filed for this visit.  Subjective Assessment - 09/15/17 1618    Subjective  Patient has been to the MD who wants her to finish out her visits. She has 1 more visit scheudled. She feels comfortable with D/C to HEP     Diagnostic tests  xray on 11/19 showing stable fracture status    Patient Stated Goals  get the arm back, be able to raise the arm.      Currently in Pain?  Yes    Pain Score  2     Pain Location  Shoulder    Pain Orientation  Left    Pain Descriptors / Indicators  Aching    Pain Type  Acute pain    Pain Onset  More than a month ago    Pain Frequency   Constant    Aggravating Factors   use of the shoulder     Pain Relieving Factors  medication     Effect of Pain on Daily Activities  unable                       [facility] Adult PT Treatment/Exercise - 09/16/17 0001      Shoulder Exercises: Supine   Other Supine Exercises  supine flexion 2x10 needs to do a press to start; supine low range d2 flexion  2lb       Shoulder Exercises: Sidelying   Other Sidelying Exercises  sidelying ER 2x10       Shoulder Exercises: Standing   External Rotation  Left;20 reps;Theraband    Theraband Level (Shoulder External Rotation)  Level 2 (Red)    Internal Rotation  Left;20 reps;Theraband    Theraband Level (Shoulder Internal Rotation)  Level 3 (Green)    Row  20 reps;Theraband    Theraband Level (Shoulder Row)  Level 3 (Green)    Retraction  20 reps;Theraband  Theraband Level (Shoulder Retraction)  Level 3 (Green)    Other Standing Exercises  Shelf reach to second shelf 2x10 2lb       Shoulder Exercises: Pulleys   Flexion  3 minutes      Shoulder Exercises: ROM/Strengthening   Other ROM/Strengthening Exercises  scap retraction  green 2x10; shoulder extension 2x10 green      Other ROM/Strengthening Exercises  IR green band 2x10; ER red band 2x10      Modalities   Modalities  Cryotherapy      Cryotherapy   Cryotherapy Location  Shoulder      Manual Therapy   Manual therapy comments  PROM Flexion, abduction, ER, IR              PT Education - 09/15/17 1620    Education provided  Yes    Education Details  reviewed HEP     Person(s) Educated  Patient    Methods  Explanation;Demonstration    Comprehension  Verbalized understanding;Returned demonstration;Verbal cues required;Tactile cues required          PT Long Term Goals - 09/15/17 1622      PT LONG TERM GOAL #1   Title  Pt will improve shoulder AROM to within 10degrees of normal     Baseline  155 degrees     Time  6    Period  Weeks    Status  Achieved       PT LONG TERM GOAL #2   Title  Pt will improve FOTO to 39% limited or less to demonstrate functional improvement    Baseline  36% limitation     Time  6    Period  Weeks    Status  Achieved      PT LONG TERM GOAL #3   Title  Pt will demonstrate L shoulder MMT of 4/5 or greater in all planes    Baseline  5/5 strength     Time  6    Period  Weeks    Status  Achieved      PT LONG TERM GOAL #4   Title  Pt will be able to do her hair without limitations from the L UE    Time  6    Period  Weeks    Status  Achieved            Plan - 09/15/17 1622    Clinical Impression Statement  The patient has made great progress. She has reached all goals for therapy. She has full range. She has reached her FOTO goal. D/C to HEP.     Clinical Presentation  Stable    PT Frequency  3x / week    PT Duration  6 weeks    PT Treatment/Interventions  Electrical Stimulation;Cryotherapy;Moist Heat;Therapeutic exercise;Manual techniques;Passive range of motion    PT Next Visit Plan  review HEP, begin L shoulder ROM and functional mobility.  modalities as needed, add bands to HEP     PT Home Exercise Plan  cross body stretch, wall ER stretch and corner overhead stretch, rockwood yellow band and scap rows, supine horizontal abudction yellow     Consulted and Agree with Plan of Care  Patient       Patient will benefit from skilled therapeutic intervention in order to improve the following deficits and impairments:  Decreased activity tolerance, Decreased strength, Pain, Decreased range of motion  Visit Diagnosis: Stiffness of left shoulder, not elsewhere classified  Acute pain of left shoulder  Muscle weakness (generalized)  PHYSICAL THERAPY DISCHARGE SUMMARY  Visits from Start of Care: 17  Current functional level related to goals / functional outcomes: performing functional tasks without pain    Remaining deficits: Pain with overuse    Education / Equipment: HEP  Plan: Patient agrees  to discharge.  Patient goals were not met. Patient is being discharged due to meeting the stated rehab goals.  ?????       Problem List Patient Active Problem List   Diagnosis Date Noted  . Closed fracture of left proximal humerus 06/17/17 07/04/2017  . IMPINGEMENT SYNDROME 03/09/2010  . CARPAL TUNNEL SYNDROME 07/05/2007  . HIGH BLOOD PRESSURE 07/04/2007    [name] PT DPT  09/16/2017, 9:34 AM  [facility] [address] [place], [place], [address] Phone: [phone]   Fax:  [phone]  Name: Kathryn Gallegos MRN: [id number] Date of Birth: [birth year]

## 2017-09-19 ENCOUNTER — Ambulatory Visit: Payer: 59

## 2017-10-05 ENCOUNTER — Ambulatory Visit
Admission: RE | Admit: 2017-10-05 | Discharge: 2017-10-05 | Disposition: A | Discharge status: Home / Self Care | Payer: 59 | Source: Ambulatory Visit | Attending: Obstetrics and Gynecology | Admitting: Obstetrics and Gynecology

## 2017-10-05 DIAGNOSIS — Z1231 Encounter for screening mammogram for malignant neoplasm of breast: Secondary | ICD-10-CM

## 2017-10-12 ENCOUNTER — Ambulatory Visit (INDEPENDENT_AMBULATORY_CARE_PROVIDER_SITE_OTHER): Payer: 59 | Admitting: Orthopedic Surgery

## 2017-10-12 VITALS — BP 160/108 | Ht 64.0 in | Wt 185.0 lb

## 2017-10-12 DIAGNOSIS — M25561 Pain in right knee: Secondary | ICD-10-CM | POA: Diagnosis not present

## 2017-10-12 DIAGNOSIS — S42295D Other nondisplaced fracture of upper end of left humerus, subsequent encounter for fracture with routine healing: Secondary | ICD-10-CM

## 2017-10-12 NOTE — Progress Notes (Addendum)
Chief Complaint  Patient presents with  . Elbow Problem    bursitis / swelling has returned   . Shoulder Injury    no complaints of pain had fracture Left shoulder 06/17/17  . Knee Pain    Right knee pain times 2 months   History the patient fractured her left shoulder secondary to fall back in November she is completed her physical therapy her shoulder feels good she is regained her range of motion  She complains of 2 months of pain behind the right knee Pain with the knee flexed relieved by extending the knee pain negotiating stairs up or down  She had a knee bruise when she fell and injured her shoulder but noticed the increasing pain in the knee about 2 months ago  Review of Systems  Musculoskeletal: Positive for joint pain. Negative for myalgias.  Neurological: Negative for tingling.   BP (!) 160/108   Ht 5\' 4"  (1.626 m)   Wt 185 lb (83.9 kg)   BMI 31.76 kg/m  Physical Exam  Constitutional: She is oriented to person, place, and time. She appears well-developed and well-nourished.  Musculoskeletal:       Right knee: She exhibits normal range of motion, no swelling, no effusion, no ecchymosis, no deformity, no laceration, no erythema and normal alignment. Tenderness found.       Legs: Neurological: She is alert and oriented to person, place, and time.  Psychiatric: She has a normal mood and affect. Judgment normal.  Vitals reviewed.  Encounter Diagnoses  Name Primary?  . Other closed nondisplaced fracture of proximal end of left humerus with routine healing, subsequent encounter 06/17/17 Yes  . Anterior knee pain, right     Recommend quadriceps strengthening exercises  She will follow-up in a month to see if we made any improvement

## 2017-11-09 ENCOUNTER — Ambulatory Visit (INDEPENDENT_AMBULATORY_CARE_PROVIDER_SITE_OTHER): Payer: 59 | Admitting: Orthopedic Surgery

## 2017-11-09 VITALS — BP 129/85 | HR 100 | Ht 64.0 in | Wt 185.0 lb

## 2017-11-09 DIAGNOSIS — M25561 Pain in right knee: Secondary | ICD-10-CM

## 2017-11-09 DIAGNOSIS — M7022 Olecranon bursitis, left elbow: Secondary | ICD-10-CM

## 2017-11-09 DIAGNOSIS — S42295D Other nondisplaced fracture of upper end of left humerus, subsequent encounter for fracture with routine healing: Secondary | ICD-10-CM

## 2017-11-09 NOTE — Progress Notes (Signed)
Progress Note   Patient ID: Kathryn Gallegos, female   DOB: [birth year], 62 y.o.   MRN: [id number]  Chief Complaint  Patient presents with  . Follow-up    Recheck on right knee and left elbow.    HPI   Review of Systems  Constitutional: Negative for chills, fever and weight loss.  Respiratory: Negative for shortness of breath.   Cardiovascular: Negative for chest pain.  Neurological: Negative for tingling.   No outpatient medications have been marked as taking for the 11/09/17 encounter (Office Visit) with [name], MD.   Current Facility-Administered Medications for the 11/09/17 encounter (Office Visit) with [name], MD  Medication  . methylPREDNISolone acetate (DEPO-MEDROL) injection 40 mg    Past Medical History:  Diagnosis Date  . Anxiety   . Anxiety and depression   . Asthma   . Borderline diabetes   . Carpal tunnel syndrome 07/05/2007   Qualifier: Diagnosis of  By: [name], [name]    . HIGH BLOOD PRESSURE 07/04/2007   Qualifier: Diagnosis of  By: [name], [name]    . HTN (hypertension)   . Hyperglycemia   . Hyperlipidemia   . IMPINGEMENT SYNDROME 03/09/2010   Qualifier: Diagnosis of  By: [name], [name]    . Obesity   . Rapid palpitations   . Seasonal allergies      Allergies  Allergen Reactions  . Amoxicillin Other (See Comments)    Inflammation in pancreas.     BP 129/85   Pulse 100   Ht 5\' 4"  (1.626 m)   Wt 185 lb (83.9 kg)   BMI 31.76 kg/m     Physical Exam  Constitutional: She is oriented to person, place, and time. She appears well-developed and well-nourished.  Musculoskeletal:       Left shoulder: She exhibits normal range of motion, no tenderness, no bony tenderness and normal strength.       Left elbow: She exhibits normal range of motion, no swelling and no deformity. No tenderness found. No olecranon process tenderness noted.       Right knee: She exhibits normal range of motion, no deformity, normal  alignment, no LCL laxity and no MCL laxity. No tenderness found.  Neurological: She is alert and oriented to person, place, and time.  Psychiatric: She has a normal mood and affect. Judgment normal.  Vitals reviewed.   Ortho Exam   Medical decision-making  Imaging: Independent reading of the x-ray  X-ray from June 17, 2017 normal anatomic alignment and no fracture dislocation joint space narrowing medially  Impression no acute fracture or dislocation mild arthritis   Encounter Diagnoses  Name Primary?  . Other closed nondisplaced fracture of proximal end of left humerus with routine healing, subsequent encounter 06/17/17 Yes  . Anterior knee pain, right   . Olecranon bursitis of left elbow    HUMERUS FRACTURE HEALED RELEASED   RIGHT KNEE NORMAL RELEASED   ELBOW NORMAL RELEASED   FU AS NEEDED   Arther Abbott, MD 11/09/2017 5:10 PM

## 2018-02-01 ENCOUNTER — Encounter

## 2018-02-01 ENCOUNTER — Ambulatory Visit (INDEPENDENT_AMBULATORY_CARE_PROVIDER_SITE_OTHER): Payer: 59 | Admitting: Orthopedic Surgery

## 2018-02-01 ENCOUNTER — Ambulatory Visit (INDEPENDENT_AMBULATORY_CARE_PROVIDER_SITE_OTHER): Payer: 59

## 2018-02-01 VITALS — BP 142/88 | HR 88 | Ht 64.0 in | Wt 187.0 lb

## 2018-02-01 DIAGNOSIS — M542 Cervicalgia: Secondary | ICD-10-CM

## 2018-02-01 DIAGNOSIS — M47812 Spondylosis without myelopathy or radiculopathy, cervical region: Secondary | ICD-10-CM | POA: Diagnosis not present

## 2018-02-01 MED ORDER — METHOCARBAMOL 500 MG PO TABS: 500.0000 mg | ORAL_TABLET | Freq: Three times a day (TID) | ORAL | 1 refills | Status: DC

## 2018-02-01 MED ORDER — PREDNISONE 10 MG (48) PO TBPK: ORAL_TABLET | Freq: Every day | ORAL | 0 refills | Status: DC

## 2018-02-01 NOTE — Progress Notes (Signed)
Progress Note   Patient ID: Kathryn Gallegos, female   DOB: [birth year], 62 y.o.   MRN: [id number] Chief Complaint  Patient presents with  . Neck Pain    Neck pain for 1 month, no injury.      MEDICAL DECISION MAKING   Imaging:  Today we took an x-ray of the neck  No loss of cervical lordosis  Mid to upper cervical spondylosis  NEW PROBLEM  Encounter Diagnoses  Name Primary?  . Neck pain   . Cervical spondylosis without myelopathy Yes     PLAN: (RX., injection, surgery,frx,mri/ct, XR 2 body ares) Prescription of prednisone and Robaxin  Return 2 weeks  Meds ordered this encounter  Medications  . methocarbamol (ROBAXIN) 500 MG tablet    Sig: Take 1 tablet (500 mg total) by mouth 3 (three) times daily.    Dispense:  60 tablet    Refill:  1  . predniSONE (STERAPRED UNI-PAK 48 TAB) 10 MG (48) TBPK tablet    Sig: Take by mouth daily.    Dispense:  48 tablet    Refill:  0       Chief Complaint  Patient presents with  . Neck Pain    Neck pain for 1 month, no injury.    62 year old female presents for evaluation of neck pain  She is 62 years old she reports 1 month history of dull aching pain midline cervical spine no radiation no numbness or tingling decreased range of motion trouble sleeping at night    Review of Systems  Constitutional: Negative for chills, fever and weight loss.  Musculoskeletal: Positive for back pain and neck pain.  Neurological: Negative for tingling, sensory change, focal weakness and weakness.   No outpatient medications have been marked as taking for the 02/01/18 encounter (Office Visit) with [name], MD.   Current Facility-Administered Medications for the 02/01/18 encounter (Office Visit) with [name], MD  Medication  . methylPREDNISolone acetate (DEPO-MEDROL) injection 40 mg    Past Medical History:  Diagnosis Date  . Anxiety   . Anxiety and depression   . Asthma   . Borderline diabetes   . Carpal  tunnel syndrome 07/05/2007   Qualifier: Diagnosis of  By: [name], [name]    . HIGH BLOOD PRESSURE 07/04/2007   Qualifier: Diagnosis of  By: [name], [name]    . HTN (hypertension)   . Hyperglycemia   . Hyperlipidemia   . IMPINGEMENT SYNDROME 03/09/2010   Qualifier: Diagnosis of  By: [name], [name]    . Obesity   . Rapid palpitations   . Seasonal allergies      Allergies  Allergen Reactions  . Amoxicillin Other (See Comments)    Inflammation in pancreas.      BP (!) 142/88   Pulse 88   Ht 5\' 4"  (1.626 m)   Wt 187 lb (84.8 kg)   BMI 32.10 kg/m    Physical Exam  Constitutional: She is oriented to person, place, and time. She appears well-developed and well-nourished. She appears distressed.  Lymphadenopathy:    She has no cervical adenopathy.       Right: No epitrochlear adenopathy present.       Left: No epitrochlear adenopathy present.  Neurological: She is alert and oriented to person, place, and time. She has normal reflexes. No cranial nerve deficit or sensory deficit. Coordination normal.  Skin: Skin is warm, dry and intact. No rash noted. She is not diaphoretic. No erythema. No pallor.  Psychiatric: She has a normal mood and affect. Her behavior is normal. Judgment and thought content normal. Cognition and memory are not impaired.  Vitals reviewed.   Ortho Exam   Right and left upper extremities normal range of motion stability strength alignment without tenderness  Cervical spine decreased range of motion all planes negative Spurling sign tenderness midline no paraspinal muscular tenderness about the right shoulder is higher than the left and there is tension in the right trap  Arther Abbott, MD  4:49 PM 02/01/2018

## 2018-02-20 ENCOUNTER — Ambulatory Visit: Payer: 59 | Admitting: Orthopedic Surgery

## 2018-07-03 ENCOUNTER — Encounter: Payer: Self-pay | Admitting: Orthopedic Surgery

## 2018-07-03 ENCOUNTER — Ambulatory Visit (INDEPENDENT_AMBULATORY_CARE_PROVIDER_SITE_OTHER): Payer: 59

## 2018-07-03 ENCOUNTER — Ambulatory Visit (INDEPENDENT_AMBULATORY_CARE_PROVIDER_SITE_OTHER): Payer: 59 | Admitting: Orthopedic Surgery

## 2018-07-03 VITALS — BP 148/84 | HR 83 | Ht 64.0 in | Wt 190.0 lb

## 2018-07-03 DIAGNOSIS — S4292XD Fracture of left shoulder girdle, part unspecified, subsequent encounter for fracture with routine healing: Secondary | ICD-10-CM

## 2018-07-03 DIAGNOSIS — M25511 Pain in right shoulder: Secondary | ICD-10-CM

## 2018-07-03 DIAGNOSIS — G8929 Other chronic pain: Secondary | ICD-10-CM

## 2018-07-03 DIAGNOSIS — M542 Cervicalgia: Secondary | ICD-10-CM | POA: Diagnosis not present

## 2018-07-03 MED ORDER — IBUPROFEN 800 MG PO TABS: 800.0000 mg | ORAL_TABLET | Freq: Three times a day (TID) | ORAL | 1 refills | Status: DC

## 2018-07-03 MED ORDER — CYCLOBENZAPRINE HCL 5 MG PO TABS: 10.0000 mg | ORAL_TABLET | Freq: Two times a day (BID) | ORAL | 1 refills | Status: DC | PRN

## 2018-07-03 MED ORDER — CYCLOBENZAPRINE HCL 5 MG PO TABS: 10.0000 mg | ORAL_TABLET | Freq: Two times a day (BID) | ORAL | 0 refills | Status: DC | PRN

## 2018-07-03 MED ORDER — HYDROCODONE-ACETAMINOPHEN 5-325 MG PO TABS
1.0000 | ORAL_TABLET | Freq: Four times a day (QID) | ORAL | 0 refills | Status: AC | PRN
Start: 2018-07-03 — End: 2018-07-08

## 2018-07-03 NOTE — Progress Notes (Signed)
New problem pain right shoulder  Chief Complaint  Patient presents with  . Shoulder Pain    right   . Neck Pain  . Form Completion    has FMLA forms     62 year old female presents for evaluation of her right shoulder.  She has a history of cervical disc disease she had a previous left proximal humerus fracture this was treated nonoperatively  She has pain along the right deltoid she is had it since July.  This began with an injury where she stepped out of her car a manhole cover was empty she fell and landed on her right shoulder about a month later she started having right shoulder pain is progressively worsened did not respond to ibuprofen.  She complains of a dull aching pain seems to be worse when she does more at work.  This is associated with painful range of motion especially past 90 degrees   Review of Systems  Musculoskeletal: Positive for neck pain.  Neurological: Negative for tingling.     Past Medical History:  Diagnosis Date  . Anxiety   . Anxiety and depression   . Asthma   . Borderline diabetes   . Carpal tunnel syndrome 07/05/2007   Qualifier: Diagnosis of  By: [name], [name]    . HIGH BLOOD PRESSURE 07/04/2007   Qualifier: Diagnosis of  By: [name], [name]    . HTN (hypertension)   . Hyperglycemia   . Hyperlipidemia   . IMPINGEMENT SYNDROME 03/09/2010   Qualifier: Diagnosis of  By: [name], [name]    . Obesity   . Rapid palpitations   . Seasonal allergies     Past Surgical History:  Procedure Laterality Date  . BREAST BIOPSY Left   . TOTAL ABDOMINAL HYSTERECTOMY    . TUBAL LIGATION      Family History  Problem Relation Age of Onset  . Pancreatic cancer Mother   . Esophageal cancer Maternal Grandmother   . Heart disease Maternal Grandfather   . Heart disease Maternal Uncle   . Colon cancer Maternal Aunt   . Stomach cancer Maternal Aunt   . Arthritis Unknown   . Cancer Unknown   . Diabetes Unknown   . Breast cancer Neg Hx     Social History   Tobacco Use  . Smoking status: Former Smoker    Types: Cigarettes  . Smokeless tobacco: Never Used  . Tobacco comment: STRT AGE 20 QUIT AGE 77 OR 21  Substance Use Topics  . Alcohol use: No  . Drug use: No    Allergies  Allergen Reactions  . Amoxicillin Other (See Comments)    Inflammation in pancreas.     Current Meds  Medication Sig  . albuterol (PROVENTIL HFA;VENTOLIN HFA) 108 (90 Base) MCG/ACT inhaler Inhale 1 puff into the lungs every 6 (six) hours as needed for wheezing or shortness of breath.  . diltiazem (CARDIZEM CD) 240 MG 24 hr capsule Take by mouth daily.  Marland Kitchen diltiazem (CARDIZEM) 30 MG tablet Take 30 mg by mouth as needed (palpitations). Max 4 tablets with 24 hours  . esomeprazole (NEXIUM) 20 MG capsule TAKE 1 TABLET BY MOUTH EVERY OTHER DAY   . ibuprofen (ADVIL,MOTRIN) 800 MG tablet Take 1 tablet (800 mg total) by mouth 3 (three) times daily with meals.  Marland Kitchen levocetirizine (XYZAL) 5 MG tablet Take 5 mg by mouth every evening.  Marland Kitchen lisinopril (PRINIVIL,ZESTRIL) 10 MG tablet lisinopril 10 mg tablet  . Probiotic Product (PROBIOTIC DAILY PO) Take  1 capsule by mouth daily as needed (as directed).   . rosuvastatin (CRESTOR) 10 MG tablet Crestor 10 mg tablet  . sertraline (ZOLOFT) 50 MG tablet Take 50 mg by mouth daily.   . [DISCONTINUED] ibuprofen (ADVIL,MOTRIN) 800 MG tablet Take 1 tablet (800 mg total) 3 (three) times daily with meals by mouth.  . [DISCONTINUED] ibuprofen (ADVIL,MOTRIN) 800 MG tablet Take 1 tablet (800 mg total) by mouth 3 (three) times daily with meals.    BP (!) 148/84   Pulse 83   Ht 5\' 4"  (1.626 m)   Wt 190 lb (86.2 kg)   BMI 32.61 kg/m   Physical Exam  Constitutional: She is oriented to person, place, and time. She appears well-developed and well-nourished.  Neurological: She is alert and oriented to person, place, and time.  Psychiatric: She has a normal mood and affect. Judgment normal.  Vitals reviewed.   Ortho  Exam  Left shoulder inspection reveals no pain with palpation.  Alignment is normal full passive range of motion stable in abduction external rotation normal muscle tone and strength no tremor skin warm dry and intact without erythema distal pulses intact temperature normal no edema cervical and epitrochlear lymph nodes normal sensation normal and 2+ equal reflex at the elbow  Right shoulder tender along the deltoid alignment is normal painful range of motion past 120 degrees positive impingement sign at 120 350 degrees stability tests were normal strength tests showed no weakness skin was intact pulses were excellent lymph nodes were negative and station was normal reflexes were equal  MEDICAL DECISION SECTION  Xrays were done at [facility]  My independent reading of xrays:  See full dictated report no fracture or dislocation type II acromion some sclerosis at the tuberosity and cyst formation near the greater tuberosity head of arthritis at the inferior glenohumeral joint without joint space narrowing  Encounter Diagnoses  Name Primary?  . Chronic right shoulder pain Yes  . Closed fracture of left shoulder with routine healing, subsequent encounter   . Neck pain     PLAN: (Rx., injectx, surgery, frx, mri/ct) Subacromial injection medications as listed   Procedure note the subacromial injection shoulder RIGHT  Verbal consent was obtained to inject the  RIGHT   Shoulder  Timeout was completed to confirm the injection site is a subacromial space of the  RIGHT  shoulder   Medication used Depo-Medrol 40 mg and lidocaine 1% 3 cc  Anesthesia was provided by ethyl chloride  The injection was performed in the RIGHT  posterior subacromial space. After pinning the skin with alcohol and anesthetized the skin with ethyl chloride the subacromial space was injected using a 20-gauge needle. There were no complications  Sterile dressing was applied.  7-week follow-up  Meds  ordered this encounter  Medications  . DISCONTD: cyclobenzaprine (FLEXERIL) 5 MG tablet    Sig: Take 2 tablets (10 mg total) by mouth 2 (two) times daily as needed for muscle spasms.    Dispense:  10 tablet    Refill:  0  . DISCONTD: ibuprofen (ADVIL,MOTRIN) 800 MG tablet    Sig: Take 1 tablet (800 mg total) by mouth 3 (three) times daily with meals.    Dispense:  90 tablet    Refill:  1  . HYDROcodone-acetaminophen (NORCO) 5-325 MG tablet    Sig: Take 1 tablet by mouth every 6 (six) hours as needed for up to 5 days for moderate pain.    Dispense:  20 tablet  Refill:  0  . cyclobenzaprine (FLEXERIL) 5 MG tablet    Sig: Take 2 tablets (10 mg total) by mouth 2 (two) times daily as needed for muscle spasms.    Dispense:  40 tablet    Refill:  1  . ibuprofen (ADVIL,MOTRIN) 800 MG tablet    Sig: Take 1 tablet (800 mg total) by mouth 3 (three) times daily with meals.    Dispense:  90 tablet    Refill:  1    Arther Abbott, MD  07/03/2018 5:15 PM

## 2018-07-18 ENCOUNTER — Encounter: Payer: Self-pay | Admitting: Internal Medicine

## 2018-07-18 ENCOUNTER — Ambulatory Visit (INDEPENDENT_AMBULATORY_CARE_PROVIDER_SITE_OTHER): Payer: 59 | Admitting: Internal Medicine

## 2018-07-18 DIAGNOSIS — J4530 Mild persistent asthma, uncomplicated: Secondary | ICD-10-CM | POA: Diagnosis not present

## 2018-07-18 LAB — NITRIC OXIDE: Nitric Oxide: 14

## 2018-07-18 MED ORDER — MOMETASONE FUROATE 220 MCG/INH IN AEPB: 2.0000 | INHALATION_SPRAY | Freq: Every day | RESPIRATORY_TRACT | 0 refills | Status: DC

## 2018-07-18 MED ORDER — MOMETASONE FUROATE 220 MCG/INH IN AEPB: INHALATION_SPRAY | RESPIRATORY_TRACT | 0 refills | Status: DC

## 2018-07-18 NOTE — Patient Instructions (Addendum)
Plan A = Automatic = Asmanex 100 Take 2 puffs first thing in am and then another 2 puffs about 12 hours later.    Work on inhaler technique:  relax and gently blow all the way out then take a nice smooth deep breath back in, triggering the inhaler at same time you start breathing in.  Hold for up to 5 seconds if you can. Blow out thru nose. Rinse and gargle with water when done    Plan B = Backup Only use your albuterol (proair) inhaler as a rescue medication to be used if you can't catch your breath by resting or doing a relaxed purse lip breathing pattern.  - The less you use it, the better it will work when you need it. - Ok to use the inhaler up to 2 puffs  every 4 hours if you must but call for appointment if use goes up over your usual need - Don't leave home without it !!  (think of it like the spare tire for your car)     Please schedule a follow up office visit in 4  weeks, call sooner if needed with all medications /inhalers/ solutions in hand so we can verify exactly what you are taking. This includes all medications from all doctors and over the [place] separate them into two bags:  the ones you take automatically, no matter what, vs the ones you take just when you feel you need them "BAG #2 is UP TO YOU"  - this will really help [place] help you take your medications more effectively.

## 2018-07-18 NOTE — Progress Notes (Signed)
Kathryn Gallegos, female    DOB: 12/14/1955,     MRN: [id number]   Brief patient profile:  15 yobf minimal smoking in HS only with h/o asthma per mother as child but no memory of need for inhalers / nebs and fine with sports in school then in her later 14's moved to [facility] with episodic rhinitis  eval by Donneta Romberg dust/ ragweed / cats > rec shots never took and in her late 65s started needing saba regularly up to daily but avg  A few times a week   So self referred to pulmonary clinic 07/18/2018     History of Present Illness  07/18/2018  Pulmonary/ 1st office eval/Chamia Schmutz  Chief Complaint  Patient presents with  . Pulmonary Consult    Self referral for asthma. Pt states she was dxed with asthma as a child. She c/o SOB with exertion such as walking up stairs. Her breathing is esp worse when the weather is warmer. She does not currently use any inhaler.    Dyspnea:  MMRC1 = can walk nl pace, flat grade, can't hurry or go uphills or steps s sob worse in hot weather but really very little variability    Cough: after supper nightly / no noct cough Sleep: 3 pillows/ bed flat - overt reflux o/w  SABA use:  avg once or twice a week    No obvious other day to day or daytime variability or assoc excess/ purulent sputum or mucus plugs or hemoptysis or cp or chest tightness, subjective wheeze or overt sinus   symptoms.   sleeps as above  without nocturnal  or early am exacerbation  of respiratory  c/o's or need for noct saba. Also denies any obvious fluctuation of symptoms with weather or environmental changes or other aggravating or alleviating factors except as outlined above   No unusual exposure hx or h/o childhood pna or knowledge of premature birth.  Current Allergies, Complete Past Medical History, Past Surgical History, Family History, and Social History were reviewed in [employer] record.  ROS  The following are not active complaints unless bolded Hoarseness, sore  throat, dysphagia, dental problems, itching, sneezing,  nasal congestion or discharge of excess mucus or purulent secretions, ear ache,   fever, chills, sweats, unintended wt loss or wt gain, classically pleuritic or exertional cp,  orthopnea pnd or arm/hand swelling  or leg swelling, presyncope, palpitations, abdominal pain, anorexia, nausea, vomiting, diarrhea  or change in bowel habits or change in bladder habits, change in stools or change in urine, dysuria, hematuria,  rash, arthralgias, visual complaints, headache, numbness, weakness or ataxia or problems with walking or coordination,  change in mood or  memory.           Past Medical History:  Diagnosis Date  . Anxiety   . Anxiety and depression   . Asthma   . Borderline diabetes   . Carpal tunnel syndrome 07/05/2007   Qualifier: Diagnosis of  By: [name], [name]    . HIGH BLOOD PRESSURE 07/04/2007   Qualifier: Diagnosis of  By: [name], [name]    . HTN (hypertension)   . Hyperglycemia   . Hyperlipidemia   . IMPINGEMENT SYNDROME 03/09/2010   Qualifier: Diagnosis of  By: [name], [name]    . Obesity   . Rapid palpitations   . Seasonal allergies     Outpatient Medications Prior to Visit - NOTE:   Unable to verify as accurately reflecting what pt takes  Medication Sig Dispense Refill  . cyclobenzaprine (FLEXERIL) 5 MG tablet Take 2 tablets (10 mg total) by mouth 2 (two) times daily as needed for muscle spasms. 40 tablet 1  . diltiazem (CARDIZEM CD) 240 MG 24 hr capsule Take by mouth daily.  2  . diltiazem (CARDIZEM) 30 MG tablet Take 30 mg by mouth as needed (palpitations). Max 4 tablets with 24 hours    . esomeprazole (NEXIUM) 20 MG capsule TAKE 1 TABLET BY MOUTH DAILY    . ibuprofen (ADVIL,MOTRIN) 800 MG tablet Take 1 tablet (800 mg total) by mouth 3 (three) times daily with meals. 90 tablet 1  . levocetirizine (XYZAL) 5 MG tablet Take 5 mg by mouth every evening.    . Probiotic Product (PROBIOTIC DAILY PO)  Take 1 capsule by mouth daily as needed (as directed).     . sertraline (ZOLOFT) 50 MG tablet Take 50 mg by mouth daily.     Marland Kitchen albuterol (PROVENTIL HFA;VENTOLIN HFA) 108 (90 Base) MCG/ACT inhaler Inhale 1 puff into the lungs every 6 (six) hours as needed for wheezing or shortness of breath.    . lisinopril (PRINIVIL,ZESTRIL) 10 MG tablet lisinopril 10 mg tablet    . rosuvastatin (CRESTOR) 10 MG tablet Crestor 10 mg tablet        Objective:     BP 120/70 (BP Location: Left Arm, Cuff Size: Normal)   Pulse 94   Ht 5\' 3"  (1.6 m)   Wt 191 lb (86.6 kg)   SpO2 96%   BMI 33.83 kg/m   SpO2: 96 %  RA   Pleasant amb min obese bf nad   HEENT: nl dentition, turbinates bilaterally, and oropharynx. Nl external ear canals without cough reflex   NECK :  without JVD/Nodes/TM/ nl carotid upstrokes bilaterally   LUNGS: no acc muscle use,  Nl contour chest which is clear to A and P bilaterally without cough on insp or exp maneuvers   CV:  RRR  no s3 or murmur or increase in P2, and no edema   ABD:  soft and nontender with nl inspiratory excursion in the supine position. No bruits or organomegaly appreciated, bowel sounds nl  MS:  Nl gait/ ext warm without deformities, calf tenderness, cyanosis or clubbing No obvious joint restrictions   SKIN: warm and dry without lesions    NEURO:  alert, approp, nl sensorium with  no motor or cerebellar deficits apparent.       Assessment   Asthma, chronic, mild persistent, uncomplicated FENO 82/12/537  = 14 Spirometry 07/18/2018  FEV1 1.7 (84%)  Ratio 87 s curvature off all rx  - 07/18/2018  After extensive coaching inhaler device,  effectiveness = 75% > trial of asmanex 100 bid x one month sample then return to ov   Hx is c/w mild asthma ? Cough variant with freq need for saba so reasonable to start with mild ics then regroup in 4 weeks to see if this eliminates her symptoms or need for saba and consider MCT if dx remains in doubt but need to keep in  mind DDX of  difficult airways management almost all start with A and  include Adherence, Ace Inhibitors, Acid Reflux, Active Sinus Disease, Alpha 1 Antitripsin deficiency, Anxiety masquerading as Airways dz,  ABPA,  Allergy(esp in young), Aspiration (esp in elderly), Adverse effects of meds,  Active smoking or vaping, A bunch of PE's (a small clot burden can't cause this syndrome unless there is already severe underlying pulm or vascular  dz with poor reserve) plus two Bs  = Bronchiectasis and Beta blocker use..and one C= CHF   ? acei making symptoms worse > consider trial off if still taking on return p verify meds    ? Allergy > consider allergy testing next   ? Acid (or non-acid) GERD > always difficult to exclude as up to 75% of pts in some series report no assoc GI/ Heartburn symptoms> rec consider max (24h)  acid suppression and diet restrictions next     >>> return in 4 weeks with all meds in hand using a trust but verify approach to confirm accurate Medication  Reconciliation The principal here is that until we are certain that the  patients are doing what we've asked, it makes no sense to ask them to do more.     Total time devoted to counseling  > 50 % of initial 45 min office visit:  review case with pt/ discussion of options/alternatives -  device teaching  extended face to face time for this visit  -  personally creating written customized instructions  in presence of pt  then going over those specific  Instructions directly with the pt including how to use all of the meds but in particular covering each new medication in detail and the difference between the maintenance= "automatic" meds and the prns using an action plan format for the latter (If this problem/symptom => do that organization reading Left to right).  Please see AVS from this visit for a full list of these instructions which I personally wrote for this pt and  are unique to this visit.      Christinia Gully, MD 07/18/2018

## 2018-07-19 ENCOUNTER — Encounter: Payer: Self-pay | Admitting: Internal Medicine

## 2018-07-19 NOTE — Assessment & Plan Note (Addendum)
FENO 07/18/2018  = 14 Spirometry 07/18/2018  FEV1 1.7 (84%)  Ratio 87 s curvature off all rx  - 07/18/2018  After extensive coaching inhaler device,  effectiveness = 75% > trial of asmanex 100 bid x one month sample then return to ov   Hx is c/w mild asthma ? Cough variant with freq need for saba so reasonable to start with mild ics then regroup in 4 weeks to see if this eliminates her symptoms or need for saba and consider MCT if dx remains in doubt but need to keep in mind DDX of  difficult airways management almost all start with A and  include Adherence, Ace Inhibitors, Acid Reflux, Active Sinus Disease, Alpha 1 Antitripsin deficiency, Anxiety masquerading as Airways dz,  ABPA,  Allergy(esp in young), Aspiration (esp in elderly), Adverse effects of meds,  Active smoking or vaping, A bunch of PE's (a small clot burden can't cause this syndrome unless there is already severe underlying pulm or vascular dz with poor reserve) plus two Bs  = Bronchiectasis and Beta blocker use..and one C= CHF   ? acei making symptoms worse > consider trial off if still taking on return p verify meds    ? Allergy > consider allergy testing next   ? Acid (or non-acid) GERD > always difficult to exclude as up to 75% of pts in some series report no assoc GI/ Heartburn symptoms> rec consider max (24h)  acid suppression and diet restrictions next     >>> return in 4 weeks with all meds in hand using a trust but verify approach to confirm accurate Medication  Reconciliation The principal here is that until we are certain that the  patients are doing what we've asked, it makes no sense to ask them to do more.     Total time devoted to counseling  > 50 % of initial 45 min office visit:  review case with pt/ discussion of options/alternatives -  device teaching  extended face to face time for this visit  -  personally creating written customized instructions  in presence of pt  then going over those specific  Instructions  directly with the pt including how to use all of the meds but in particular covering each new medication in detail and the difference between the maintenance= "automatic" meds and the prns using an action plan format for the latter (If this problem/symptom => do that organization reading Left to right).  Please see AVS from this visit for a full list of these instructions which I personally wrote for this pt and  are unique to this visit.

## 2018-08-14 ENCOUNTER — Encounter: Payer: Self-pay | Admitting: Nurse Practitioner

## 2018-08-15 ENCOUNTER — Encounter: Payer: Self-pay | Admitting: Nurse Practitioner

## 2018-08-15 ENCOUNTER — Encounter: Payer: Self-pay | Admitting: Internal Medicine

## 2018-08-15 ENCOUNTER — Ambulatory Visit (INDEPENDENT_AMBULATORY_CARE_PROVIDER_SITE_OTHER): Payer: 59 | Admitting: Internal Medicine

## 2018-08-15 VITALS — BP 106/70 | HR 87 | Ht 64.0 in | Wt 191.0 lb

## 2018-08-15 DIAGNOSIS — R0989 Other specified symptoms and signs involving the circulatory and respiratory systems: Secondary | ICD-10-CM

## 2018-08-15 DIAGNOSIS — R198 Other specified symptoms and signs involving the digestive system and abdomen: Secondary | ICD-10-CM

## 2018-08-15 DIAGNOSIS — J4530 Mild persistent asthma, uncomplicated: Secondary | ICD-10-CM | POA: Diagnosis not present

## 2018-08-15 MED ORDER — MOMETASONE FUROATE 220 MCG/INH IN AEPB: INHALATION_SPRAY | RESPIRATORY_TRACT | 11 refills | Status: DC

## 2018-08-15 NOTE — Patient Instructions (Addendum)
No change on asmnex for now   Work on inhaler technique:  relax and gently blow all the way out then take a nice smooth deep breath back in, triggering the inhaler at same time you start breathing in.  Hold for up to 5 seconds if you can. Blow out thru nose. Rinse and gargle with water when done      GERD (REFLUX)  is an extremely common cause of respiratory symptoms just like yours , many times with no obvious heartburn at all.    It can be treated with medication, but also with lifestyle changes including elevation of the head of your bed (ideally with 6 inch  bed blocks),  Smoking cessation, avoidance of late meals, excessive alcohol, and avoid fatty foods, chocolate, peppermint, colas, red wine, and acidic juices such as orange juice.  NO MINT OR MENTHOL PRODUCTS SO NO COUGH DROPS   USE SUGARLESS CANDY INSTEAD (Jolley ranchers or Stover's or Life Savers) or even ice chips will also do - the key is to swallow to prevent all throat clearing. NO OIL BASED VITAMINS - use powdered substitutes.    nexium Take 30- 60 min before your first and last meals of the day until you return    Please schedule a follow up office visit in 6 weeks, call sooner if needed with all medications /inhalers/ solutions in hand so we can verify exactly what you are taking. This includes all medications from all doctors and over the counters

## 2018-08-15 NOTE — Progress Notes (Signed)
Kathryn Gallegos, female    DOB: 09-30-1955,     MRN: [id number]     Brief patient profile:  59 yobf minimal smoking in HS only with h/o asthma per mother as child but no memory of need for inhalers / nebs and fine with sports in school then in her later 60's moved to [facility] with episodic rhinitis  eval by Donneta Romberg dust/ ragweed / cats > rec shots never took and in her late 84s started needing saba regularly up to daily but avg  A few times a week   So self referred to pulmonary clinic 07/18/2018     History of Present Illness  07/18/2018  Pulmonary/ 1st office eval/Kathryn Gallegos  Chief Complaint  Patient presents with  . Pulmonary Consult    Self referral for asthma. Pt states she was dxed with asthma as a child. She c/o SOB with exertion such as walking up stairs. Her breathing is esp worse when the weather is warmer. She does not currently use any inhaler.    Dyspnea:  MMRC1 = can walk nl pace, flat grade, can't hurry or go uphills or steps s sob worse in hot weather but really very little variability    Cough: after supper nightly / no noct cough Sleep: 3 pillows/ bed flat - overt reflux o/w  SABA use:  avg once or twice a week  rec Plan A = Automatic = Asmanex 110 Take 2 puffs first thing in am and then another 2 puffs about 12 hours later.  Work on inhaler technique:     Plan B = Backup Only use your albuterol (proair) inhaler as a rescue medication      08/15/2018  f/u ov/Kathryn Gallegos re:  ? Asthma on asmanex 110 x 2 each am Chief Complaint  Patient presents with  . Follow-up    Breathing is "pretty good".  No new co's. She rarely uses her albuterol inhaler.    Dyspnea:  Not limited by breathing from desired activities   Cough: no just sense of globus 24/7 ? Worse off nexium Sleeping: 3 pillows/ bed is flat  SABA use: none lately  02: none    No obvious day to day or daytime variability or assoc excess/ purulent sputum or mucus plugs or hemoptysis or cp or chest tightness, subjective  wheeze or overt sinus or hb symptoms.   Sleeping as above without nocturnal  or early am exacerbation  of respiratory  c/o's or need for noct saba. Also denies any obvious fluctuation of symptoms with weather or environmental changes or other aggravating or alleviating factors except as outlined above   No unusual exposure hx or h/o childhood pna  or knowledge of premature birth.  Current Allergies, Complete Past Medical History, Past Surgical History, Family History, and Social History were reviewed in [employer] record.  ROS  The following are not active complaints unless bolded Hoarseness, sore throat, dysphagia, dental problems, itching, sneezing,  nasal congestion or discharge of excess mucus or purulent secretions, ear ache,   fever, chills, sweats, unintended wt loss or wt gain, classically pleuritic or exertional cp,  orthopnea pnd or arm/hand swelling  or leg swelling, presyncope, palpitations, abdominal pain, anorexia, nausea, vomiting, diarrhea  or change in bowel habits or change in bladder habits, change in stools or change in urine, dysuria, hematuria,  rash, arthralgias, visual complaints, headache, numbness, weakness or ataxia or problems with walking or coordination,  change in mood/grief rx death of cousin or  memory.        Current Meds  Medication Sig  . cyclobenzaprine (FLEXERIL) 5 MG tablet Take 2 tablets (10 mg total) by mouth 2 (two) times daily as needed for muscle spasms.  Marland Kitchen diltiazem (CARDIZEM CD) 240 MG 24 hr capsule Take by mouth daily.  Marland Kitchen diltiazem (CARDIZEM) 30 MG tablet Take 30 mg by mouth as needed (palpitations). Max 4 tablets with 24 hours  . ibuprofen (ADVIL,MOTRIN) 800 MG tablet Take 1 tablet (800 mg total) by mouth 3 (three) times daily with meals.  Marland Kitchen levocetirizine (XYZAL) 5 MG tablet Take 5 mg by mouth every evening.  Marland Kitchen  asmanex 110  2 each am   . sertraline (ZOLOFT) 50 MG tablet Take 50 mg by mouth daily.   .              Objective:    amb bf nad   Wt Readings from Last 3 Encounters:  08/15/18 191 lb (86.6 kg)  07/18/18 191 lb (86.6 kg)  07/03/18 190 lb (86.2 kg)     Vital signs reviewed - Note on arrival 02 sats  97% on RA     HEENT: nl dentition, turbinates bilaterally, and oropharynx. Nl external ear canals without cough reflex   NECK :  without JVD/Nodes/TM/ nl carotid upstrokes bilaterally   LUNGS: no acc muscle use,  Nl contour chest which is clear to A and P bilaterally without cough on insp or exp maneuvers   CV:  RRR  no s3 or murmur or increase in P2, and no edema   ABD:  Obese/ soft and nontender with nl inspiratory excursion in the supine position. No bruits or organomegaly appreciated, bowel sounds nl  MS:  Nl gait/ ext warm without deformities, calf tenderness, cyanosis or clubbing No obvious joint restrictions   SKIN: warm and dry without lesions    NEURO:  alert, approp, nl sensorium with  no motor or cerebellar deficits apparent.       Assessment

## 2018-08-16 ENCOUNTER — Encounter: Payer: Self-pay | Admitting: Internal Medicine

## 2018-08-16 DIAGNOSIS — R198 Other specified symptoms and signs involving the digestive system and abdomen: Secondary | ICD-10-CM | POA: Insufficient documentation

## 2018-08-16 DIAGNOSIS — R0989 Other specified symptoms and signs involving the circulatory and respiratory systems: Secondary | ICD-10-CM | POA: Insufficient documentation

## 2018-08-16 MED ORDER — MOMETASONE FUROATE 110 MCG/INH IN AEPB: INHALATION_SPRAY | RESPIRATORY_TRACT | 11 refills | Status: DC

## 2018-08-16 NOTE — Assessment & Plan Note (Addendum)
Flared off nexium 08/15/2018 > resume plus diet advised and f/u gi prn    Note this may be part of the spectrum of Upper airway cough syndrome (previously labeled PNDS),  is so named because it's frequently impossible to sort out how much is  CR/sinusitis with freq throat clearing (which can be related to primary GERD)   vs  causing  secondary (" extra esophageal")  GERD from wide swings in gastric pressure that occur with throat clearing, often  promoting self use of mint and menthol lozenges that reduce the lower esophageal sphincter tone and exacerbate the problem further in a cyclical fashion.   These are the same pts (now being labeled as having "irritable larynx syndrome" by some cough centers) who not infrequently have a history of having failed to tolerate ace inhibitors,  dry powder inhalers (and even sometimes low dose hfa ics like asmanex) or biphosphonates or report having atypical/extraesophageal reflux symptoms that don't respond to standard doses of PPI  and are easily confused as having aecopd or asthma flares by even experienced allergists/ pulmonologists (myself included).    I had an extended discussion with the patient reviewing all relevant studies completed to date and  lasting 15 to 20 minutes of a 25 minute visit    See device teaching which extended face to face time for this visit.  Each maintenance medication was reviewed in detail including emphasizing most importantly the difference between maintenance and prns and under what circumstances the prns are to be triggered using an action plan format that is not reflected in the computer generated alphabetically organized AVS which I have not found useful in most complex patients, especially with respiratory illnesses  Please see AVS for specific instructions unique to this visit that I personally wrote and verbalized to the the pt in detail and then reviewed with pt  by my nurse highlighting any  changes in therapy recommended  at today's visit to their plan of care.

## 2018-08-16 NOTE — Assessment & Plan Note (Signed)
FENO 07/18/2018  =   14 Spirometry 07/18/2018  FEV1 1.7 (84%)  Ratio 87 s curvature off all rx  - 07/18/2018    trial of asmanex 100  2 bid x one month sample then return to ov > 08/15/2018 reported using only 2 pffs each am   - 08/15/2018  After extensive coaching inhaler device,  effectiveness =    75%     All goals of chronic asthma control met including optimal function and elimination of symptoms with minimal need for rescue therapy.  Contingencies discussed in full including contacting this office immediately if not controlling the symptoms using the rule of two's.      Discussed also:  Formulary restrictions will be an ongoing challenge for the forseable future and I would be happy to pick an alternative if the pt will first  provide me a list of them but pt  will need to return here for training for any new device that is required eg dpi vs hfa vs respimat.    In meantime we can always provide samples so the patient never runs out of any needed respiratory medications.

## 2018-08-21 ENCOUNTER — Encounter: Payer: Self-pay | Admitting: Orthopedic Surgery

## 2018-08-21 ENCOUNTER — Ambulatory Visit (INDEPENDENT_AMBULATORY_CARE_PROVIDER_SITE_OTHER): Payer: No Typology Code available for payment source | Admitting: Orthopedic Surgery

## 2018-08-21 VITALS — BP 125/85 | HR 74 | Ht 64.0 in | Wt 190.0 lb

## 2018-08-21 DIAGNOSIS — G8929 Other chronic pain: Secondary | ICD-10-CM

## 2018-08-21 DIAGNOSIS — M25511 Pain in right shoulder: Secondary | ICD-10-CM | POA: Diagnosis not present

## 2018-08-21 NOTE — Progress Notes (Signed)
Chief Complaint  Patient presents with  . Shoulder Pain    right     63 year old female presents for reevaluation of ongoing pain in her right shoulder.  She had an x-ray in November which showed greater tuberosity sclerosis without glenohumeral arthritis or proximal humeral migration her cervical spine showed degenerative disc disease in the cervical spine with spondylosis.  Her pain started last July it is along the right anterior deltoid and seems to be over the biceps tendon and rotator interval.  This injury started when she stepped out of a car tripped over a manhole cover and fell  She has had ibuprofen and injection and has not improved   She says her pain is severe even after resting during the period of grieving for her aunt who died  Review of systems she does not complain of numbness or tingling she does have some chronic neck pain  Past Medical History:  Diagnosis Date  . Anxiety   . Anxiety and depression   . Asthma   . Borderline diabetes   . Carpal tunnel syndrome 07/05/2007   Qualifier: Diagnosis of  By: [name], [name]    . HIGH BLOOD PRESSURE 07/04/2007   Qualifier: Diagnosis of  By: [name], [name]    . HTN (hypertension)   . Hyperglycemia   . Hyperlipidemia   . IMPINGEMENT SYNDROME 03/09/2010   Qualifier: Diagnosis of  By: [name], [name]    . Obesity   . Rapid palpitations   . Seasonal allergies    BP 125/85   Pulse 74   Ht 5\' 4"  (1.626 m)   Wt 190 lb (86.2 kg)   BMI 32.61 kg/m  Right shoulder is tender in the rotator interval and anteriorly she has painful passive motion with restrictions and internal rotation her cuff shows some mild weakness in her drop test was positive with poor control on descent of the shoulder and arm   Recommend MRI right shoulder to evaluate rotator cuff tear and determine if surgery is needed  She will continue with ibuprofen for pain

## 2018-08-22 ENCOUNTER — Telehealth: Payer: Self-pay | Admitting: Orthopedic Surgery

## 2018-08-22 NOTE — Telephone Encounter (Signed)
Faxed medical accomodations form completed by Dr Aline Brochure to patient's employer: [employer], fax#  [phone], per patient request; patient aware. (authorization form on file)

## 2018-08-23 ENCOUNTER — Telehealth: Payer: Self-pay | Admitting: Radiology

## 2018-08-23 NOTE — Telephone Encounter (Signed)
-----   Message from Uvaldo Bristle sent at 08/23/2018  3:37 PM EST ----- Regarding: Insurance for MRI [employer] has changed. U[employer] is now vision benefits only. We received a call today from [place] ph# [phone] Ext [phone]  We are in process of entering the new insurance information into the system, which we have been advised is the 3rd party administrator for this plan; this plan uses the [employer] network, per patient and per "[employer]" Benefits Plan / Stacy.  Patient will bring card to office by tomorrow, 08/24/18, for MRI, which is currently scheduled on 08/25/18

## 2018-08-23 NOTE — Telephone Encounter (Signed)
Will likely need to RS MRI until I can get authorization.

## 2018-08-24 NOTE — Telephone Encounter (Signed)
I received a fax for old [facility] insurance information, still do not have new insurance   Called to cancel scan until after we receive new insurance information

## 2018-08-25 ENCOUNTER — Ambulatory Visit (HOSPITAL_COMMUNITY): Payer: Commercial Managed Care - PPO

## 2018-08-25 ENCOUNTER — Ambulatory Visit (HOSPITAL_COMMUNITY)
Admission: RE | Admit: 2018-08-25 | Discharge: 2018-08-25 | Disposition: A | Discharge status: Home / Self Care | Payer: No Typology Code available for payment source | Source: Ambulatory Visit | Attending: Orthopedic Surgery | Admitting: Orthopedic Surgery

## 2018-08-25 DIAGNOSIS — M25511 Pain in right shoulder: Secondary | ICD-10-CM | POA: Insufficient documentation

## 2018-08-25 DIAGNOSIS — G8929 Other chronic pain: Secondary | ICD-10-CM | POA: Diagnosis present

## 2018-09-01 ENCOUNTER — Encounter: Payer: Self-pay | Admitting: Orthopedic Surgery

## 2018-09-01 ENCOUNTER — Ambulatory Visit (INDEPENDENT_AMBULATORY_CARE_PROVIDER_SITE_OTHER): Payer: No Typology Code available for payment source | Admitting: Orthopedic Surgery

## 2018-09-01 VITALS — BP 147/89 | HR 81 | Ht 64.0 in | Wt 190.0 lb

## 2018-09-01 DIAGNOSIS — M25511 Pain in right shoulder: Secondary | ICD-10-CM

## 2018-09-01 DIAGNOSIS — G8929 Other chronic pain: Secondary | ICD-10-CM | POA: Diagnosis not present

## 2018-09-01 NOTE — Patient Instructions (Signed)
[facility] at [facility]. [address] [place], [place] [address]  [phone]

## 2018-09-01 NOTE — Progress Notes (Signed)
FOLLOW UP VISIT : MRI RESULTS   Chief Complaint  Patient presents with  . Shoulder Pain    right      HPI: The patient is here TO DISCUSS THE RESULTS OF MRI  Follow-up MRI results right shoulder complains of right shoulder pain   History below:  63 year old female presents for evaluation of her right shoulder.  She has a history of cervical disc disease she had a previous left proximal humerus fracture this was treated nonoperatively  She has pain along the right deltoid she is had it since July.  This began with an injury where she stepped out of her car a manhole cover was empty she fell and landed on her right shoulder about a month later she started having right shoulder pain is progressively worsened did not respond to ibuprofen.  She complains of a dull aching pain seems to be worse when she does more at work.  This is associated with painful range of motion especially past 90 degrees  Updated review of systems no change from August 21, 2018 Review of Systems  Musculoskeletal: Positive for neck pain.  Neurological: Negative for tingling.     BP (!) 147/89   Pulse 81   Ht 5\' 4"  (1.626 m)   Wt 190 lb (86.2 kg)   BMI 32.61 kg/m   Passive external rotation is 50 degrees with her arm at her side 130 degrees of flexion scapular plane and 110 degrees of abduction which are painful  Medical decision-making section   DATA  MRI REPORT:CLINICAL DATA:  Chronic right shoulder pain since fall 6 months ago.   EXAM: MRI OF THE RIGHT SHOULDER WITHOUT CONTRAST   TECHNIQUE: Multiplanar, multisequence MR imaging of the shoulder was performed. No intravenous contrast was administered.   COMPARISON:  Right shoulder x-rays dated July 03, 2018.   FINDINGS: Rotator cuff: Moderate supraspinatus and infraspinatus tendinosis. Low-grade partial-thickness, articular surface tear of the mid to distal supraspinatus tendon. Mild subscapularis tendinosis with articular surface fraying.  The teres minor tendon is unremarkable.   Muscles: Small tear along the infraspinatus myotendinous junction with incompletely visualized, U shaped intramuscular ganglion cyst along the scapular body, measuring at least 18 x 10 x 12 mm. No muscle edema or atrophy.   Biceps long head:  Intact and normally positioned.   Acromioclavicular Joint: Mild arthropathy of the acromioclavicular joint. Type I acromion. Trace subacromial/subdeltoid bursal fluid.   Glenohumeral Joint: No joint effusion. No chondral defect.   Labrum: Grossly intact, but evaluation is limited by lack of intraarticular fluid.   Bones: No acute fracture or dislocation. Reactive cystic changes in the greater tuberosity. No focal bone lesion.   Other: None.   IMPRESSION: 1. Moderate supraspinatus tendinosis with low-grade partial-thickness, articular surface tear of the mid to distal tendon. 2. Moderate infraspinatus tendinosis. Small interstitial tear along the infraspinatus myotendinous junction with incompletely visualized intramuscular ganglion cyst along the scapular body, measuring at least 18 mm. 3. Mild subscapularis tendinosis with articular surface fraying. 4. Mild acromioclavicular osteoarthritis.     Electronically Signed   By: Titus Dubin M.D.   On: 08/25/2018 18:27   MY READING: MRI OF THE [place] type rotator cuff undersurface tear   Encounter Diagnosis  Name Primary?  . Chronic right shoulder pain partial rotator cuff tear, no change continued pain Yes     PLAN: Commend physical therapy and return in 6 weeks

## 2018-09-13 ENCOUNTER — Telehealth: Payer: Self-pay | Admitting: Orthopedic Surgery

## 2018-09-13 DIAGNOSIS — M25511 Pain in right shoulder: Principal | ICD-10-CM

## 2018-09-13 DIAGNOSIS — G8929 Other chronic pain: Secondary | ICD-10-CM

## 2018-09-13 NOTE — Telephone Encounter (Signed)
Faxed

## 2018-09-13 NOTE — Telephone Encounter (Addendum)
Patient called stating that she could not afford to go to the physical therapy place Dr. Aline Brochure referred her to. Stated her copay there would be $120 per visit. She stated she called her insurance company and found a place in network in [place]. She wants to see if Dr. Aline Brochure could refer her to this place. Her copay is $10 for this place.  [facility] and [place]. Phone# [phone]/Fax # [phone]  Patient called back and she has already got an appointment for Thursday 09/21/18 @ 3:30 pm. She said they told her we could fax the referral.  Please call and advise

## 2018-09-14 ENCOUNTER — Ambulatory Visit: Payer: Commercial Managed Care - PPO | Admitting: Physical Therapy

## 2018-09-21 ENCOUNTER — Ambulatory Visit: Payer: Self-pay | Admitting: Cardiology

## 2018-09-22 ENCOUNTER — Ambulatory Visit: Payer: Self-pay | Admitting: Cardiology

## 2018-09-22 ENCOUNTER — Encounter: Payer: Self-pay | Admitting: Cardiology

## 2018-09-22 DIAGNOSIS — I471 Supraventricular tachycardia, unspecified: Secondary | ICD-10-CM

## 2018-09-22 DIAGNOSIS — R0600 Dyspnea, unspecified: Secondary | ICD-10-CM | POA: Insufficient documentation

## 2018-09-22 DIAGNOSIS — R0609 Other forms of dyspnea: Secondary | ICD-10-CM

## 2018-09-22 DIAGNOSIS — E7800 Pure hypercholesterolemia, unspecified: Secondary | ICD-10-CM

## 2018-09-22 HISTORY — DX: Dyspnea, unspecified: R06.00

## 2018-09-22 HISTORY — DX: Supraventricular tachycardia, unspecified: I47.10

## 2018-09-22 HISTORY — DX: Other forms of dyspnea: R06.09

## 2018-09-22 HISTORY — DX: Supraventricular tachycardia: I47.1

## 2018-09-22 HISTORY — DX: Pure hypercholesterolemia, unspecified: E78.00

## 2018-09-22 NOTE — Progress Notes (Deleted)
Subjective:   @Patient  ID: Kathryn Gallegos, female    DOB: [birth year], 63 y.o.   MRN: [id number]  No chief complaint on file.   HPI  Kathryn Gallegos is an African-American female with a history of hypertension, bronchial ashtma, hyperlipidemia, and hyperglycemia seen in the emergency room on 05/07/2017 when she presented with palpitations and chest pain and EKG revealed SVT at the rate of 172 bpm with secondary ST-T wave changes in the inferior and lateral leads suggestive of ischemia. She converted to sinus rhythm with adenosine was and discharged home.  Was evaluated by EP and she preferred medical therapy due to fear of pacemaker implantation. Now on Diltiazem and states she has not had any further episodes.   She was seen by me on 07/01/2018, was having worsening dyspnea on exertion.  She underwent echocardiogram,nuclear stress testing, started on Crestor 10 mg and now presents for follow-up.  Past Medical History:  Diagnosis Date  . Anxiety   . Anxiety and depression   . Asthma   . Borderline diabetes   . Carpal tunnel syndrome 07/05/2007   Qualifier: Diagnosis of  By: [name], [name]    . Dyspnea on exertion 09/22/2018  . HIGH BLOOD PRESSURE 07/04/2007   Qualifier: Diagnosis of  By: [name], [name]    . HTN (hypertension)   . Hyperglycemia   . Hyperlipidemia   . IMPINGEMENT SYNDROME 03/09/2010   Qualifier: Diagnosis of  By: [name], [name]    . Obesity   . PSVT (paroxysmal supraventricular tachycardia) ([place]) 09/22/2018  . Pure hypercholesterolemia 09/22/2018  . Rapid palpitations   . Seasonal allergies     Past Surgical History:  Procedure Laterality Date  . BREAST BIOPSY Left   . TOTAL ABDOMINAL HYSTERECTOMY    . TUBAL LIGATION      Social History   Socioeconomic History  . Marital status: Married    Spouse name: Not on file  . Number of children: Not on file  . Years of education: 2 yr coll.  . Highest education level: Not on file    Occupational History  . Occupation: Psychiatric nurse: [employer]  . Financial resource strain: Not on file  . Food insecurity:    Worry: Not on file    Inability: Not on file  . Transportation needs:    Medical: Not on file    Non-medical: Not on file  Tobacco Use  . Smoking status: Former Smoker    Packs/day: 0.25    Years: 2.00    Pack years: 0.50    Types: Cigarettes    Last attempt to quit: 08/16/1976    Years since quitting: 42.1  . Smokeless tobacco: Never Used  Substance and Sexual Activity  . Alcohol use: No  . Drug use: No  . Sexual activity: Yes    Birth control/protection: Surgical    Comment: HYST /MARRIED  Lifestyle  . Physical activity:    Days per week: Not on file    Minutes per session: Not on file  . Stress: Not on file  Relationships  . Social connections:    Talks on phone: Not on file    Gets together: Not on file    Attends religious service: Not on file    Active member of club or organization: Not on file    Attends meetings of clubs or organizations: Not on file    Relationship status: Not on file  . Intimate  partner violence:    Fear of current or ex partner: Not on file    Emotionally abused: Not on file    Physically abused: Not on file    Forced sexual activity: Not on file  Other Topics Concern  . Not on file  Social History Narrative  . Not on file    Current Outpatient Medications on File Prior to Visit  Medication Sig Dispense Refill  . Albuterol Sulfate (PROAIR HFA IN) Inhale 2 puffs into the lungs every 4 (four) hours as needed.    . cyclobenzaprine (FLEXERIL) 5 MG tablet Take 2 tablets (10 mg total) by mouth 2 (two) times daily as needed for muscle spasms. 40 tablet 1  . diltiazem (CARDIZEM CD) 240 MG 24 hr capsule Take by mouth daily.  2  . diltiazem (CARDIZEM) 30 MG tablet Take 30 mg by mouth as needed (palpitations). Max 4 tablets with 24 hours    . esomeprazole (NEXIUM) 20 MG capsule TAKE  1 TABLET BY MOUTH DAILY    . ibuprofen (ADVIL,MOTRIN) 800 MG tablet Take 1 tablet (800 mg total) by mouth 3 (three) times daily with meals. 90 tablet 1  . levocetirizine (XYZAL) 5 MG tablet Take 5 mg by mouth every evening.    . Mometasone Furoate 110 MCG/INH AEPB 2 puffs each am 1 Inhaler 11  . sertraline (ZOLOFT) 50 MG tablet Take 50 mg by mouth daily.      No current facility-administered medications on file prior to visit.      Review of Systems  Constitutional: Negative for malaise/fatigue and weight loss.  Respiratory: Positive for shortness of breath and wheezing (occasional, has bronchial asthma). Negative for cough and hemoptysis.   Cardiovascular: Negative for chest pain, palpitations, claudication and leg swelling.  Gastrointestinal: Negative for abdominal pain, blood in stool, constipation, heartburn and vomiting.  Genitourinary: Negative for dysuria.  Musculoskeletal: Positive for joint pain (left shoulder). Negative for myalgias.  Neurological: Negative for dizziness, focal weakness and headaches.  Endo/Heme/Allergies: Does not bruise/bleed easily.  Psychiatric/Behavioral: Negative for depression. The patient is not nervous/anxious.   All other systems reviewed and are negative.      Objective:  There were no vitals taken for this visit.  Physical Exam  Constitutional: She appears well-developed. No distress.  Mildly obese  HENT:  Head: Atraumatic.  Eyes: Conjunctivae are normal.  Neck: Neck supple. No JVD present. No thyromegaly present.  Cardiovascular: Normal rate, regular rhythm and intact distal pulses. Exam reveals no gallop.  Murmur (early systolic 2/6 murmur in aortic area) heard. Pulmonary/Chest: Effort normal and breath sounds normal.  Abdominal: Soft. Bowel sounds are normal.  Musculoskeletal: Normal range of motion.        General: Edema (trace) present.  Neurological: She is alert.  Skin: Skin is warm and dry.  Psychiatric: She has a normal mood  and affect.    Echocardiogram 07/05/2018: Left ventricle cavity is normal in size. Mild concentric hypertrophy of the left ventricle. Normal global wall motion. Doppler evidence of grade I (impaired) diastolic dysfunction, normal LAP. Calculated EF 67%. Mild (Grade I) mitral regurgitation. Mild tricuspid regurgitation. No evidence of pulmonary hypertension.  Exercise sestamibi stress test 07/21/2018: 1. The patient performed treadmill exercise using Bruce protocol, completing 6:45 minutes. The patient completed an estimated workload of 8.2 METS, reaching 87% of the maximum predicted heart rate. Resting hypertension 150/100 mmHg with exaggerated exercise BP response, peak BP 230/110 mmHg. No stress symptoms reported. Exercise capacity was normal. No ischemic changes seen  on stress electrocardiogram.  2. The overall quality of the study is excellent. There is no evidence of abnormal lung activity. Stress and rest SPECT images demonstrate homogeneous tracer distribution throughout the myocardium. Gated SPECT imaging reveals normal myocardial thickening and wall motion. The left ventricular ejection fraction was normal (73%). 3. Low risk study.   Assessment & Recommendations:   1. PSVT (paroxysmal supraventricular tachycardia) (HCC) ***  2. Dyspnea on exertion ***  3. Asthma, chronic, mild persistent, uncomplicated ***  4. Pure hypercholesterolemia Labs 09/29/2017: Hemoglobin A1c 5.7%.  TSH normal.  Vitamin D normal.  CBC normal.  Creatinine 0.7, EGFR 84/97, potassium 4.3, CMP normal.  Cholesterol 192, triglycerides 92, HDL 58, LDL 116.   Recommendations: I reviewed the results of the echocardiogram and a stress test with the patient, essentially within normal limits.  Overall low risk.  I suspect her symptoms are related to mild deconditioning, mild obesity and associated underlying bronchial asthma.  Hence will continue observation for now and primary prevention. She is presently  tolerating Crestor, she will need repeat lipid profile testing.  With regard to PSVT, she would prefer to continue medical therapy for now.  Adrian Prows, MD, [facility] 09/22/2018, 8:17 AM [facility]. [place] Pager: [phone] Office: [phone] If no answer Cell [phone]

## 2018-09-25 ENCOUNTER — Other Ambulatory Visit: Payer: Self-pay | Admitting: Obstetrics and Gynecology

## 2018-09-25 DIAGNOSIS — Z1231 Encounter for screening mammogram for malignant neoplasm of breast: Secondary | ICD-10-CM

## 2018-09-27 ENCOUNTER — Encounter: Payer: Self-pay | Admitting: Internal Medicine

## 2018-09-27 ENCOUNTER — Ambulatory Visit (INDEPENDENT_AMBULATORY_CARE_PROVIDER_SITE_OTHER): Payer: No Typology Code available for payment source | Admitting: Internal Medicine

## 2018-09-27 VITALS — BP 132/82 | HR 94 | Ht 64.0 in | Wt 189.8 lb

## 2018-09-27 DIAGNOSIS — R0989 Other specified symptoms and signs involving the circulatory and respiratory systems: Secondary | ICD-10-CM

## 2018-09-27 DIAGNOSIS — R198 Other specified symptoms and signs involving the digestive system and abdomen: Secondary | ICD-10-CM

## 2018-09-27 DIAGNOSIS — J4530 Mild persistent asthma, uncomplicated: Secondary | ICD-10-CM

## 2018-09-27 MED ORDER — MOMETASONE FUROATE 200 MCG/ACT IN AERO: 1.0000 | INHALATION_SPRAY | Freq: Two times a day (BID) | RESPIRATORY_TRACT | 0 refills | Status: DC

## 2018-09-27 NOTE — Patient Instructions (Addendum)
No change in medications  GERD (REFLUX)  is an extremely common cause of respiratory symptoms just like yours , many times with no obvious heartburn at all.    It can be treated with medication, but also with lifestyle changes including elevation of the head of your bed (ideally with 6 -8inch blocks under the headboard of your bed),  Smoking cessation, avoidance of late meals, excessive alcohol, and avoid fatty foods, chocolate, peppermint, colas, red wine, and acidic juices such as orange juice.  NO MINT OR MENTHOL PRODUCTS SO NO COUGH DROPS  USE SUGARLESS CANDY INSTEAD (Jolley ranchers or Stover's or Life Savers) or even ice chips will also do - the key is to swallow to prevent all throat clearing. NO OIL BASED VITAMINS - use powdered substitutes.  Avoid fish oil when coughing.     Please schedule a follow up office visit in 4 weeks, sooner if needed with full pfts - don't use your asmanex that day

## 2018-09-27 NOTE — Progress Notes (Signed)
Kathryn Gallegos, female    DOB: 01-Feb-1956,     MRN: [id number]     Brief patient profile:  56 yobf minimal smoking in HS only with h/o asthma per mother as child but no memory of need for inhalers / nebs and fine with sports in school then in her later 83's moved to [facility] with episodic rhinitis  eval by Donneta Romberg dust/ ragweed / cats > rec shots never took and in her late 38s started needing saba regularly up to daily but avg  A few times a week   So self referred to pulmonary clinic 07/18/2018     History of Present Illness  07/18/2018  Pulmonary/ 1st office eval/Wert  Chief Complaint  Patient presents with  . Pulmonary Consult    Self referral for asthma. Pt states she was dxed with asthma as a child. She c/o SOB with exertion such as walking up stairs. Her breathing is esp worse when the weather is warmer. She does not currently use any inhaler.    Dyspnea:  MMRC1 = can walk nl pace, flat grade, can't hurry or go uphills or steps s sob worse in hot weather but really very little variability    Cough: after supper nightly / no noct cough Sleep: 3 pillows/ bed flat - overt reflux o/w  SABA use:  avg once or twice a week  rec Plan A = Automatic = Asmanex 110 Take 2 puffs first thing in am and then another 2 puffs about 12 hours later.  Work on inhaler technique:     Plan B = Backup Only use your albuterol (proair) inhaler as a rescue medication      08/15/2018  f/u ov/Wert re:  ? Asthma on asmanex 110 x 2 each am Chief Complaint  Patient presents with  . Follow-up    Breathing is "pretty good".  No new co's. She rarely uses her albuterol inhaler.   Dyspnea:  Not limited by breathing from desired activities   Cough: no just sense of globus 24/7 ? Worse off nexium Sleeping: 3 pillows/ bed is flat  SABA use: none lately  rec No change on asmnex for now  Work on inhaler technique:   GERD  Nexium Take 30- 60 min before your first and last meals of the day until you return     09/27/2018  f/u ov/Wert re:  Probable asthma / maint on asmanex 100 2 each am  Chief Complaint  Patient presents with  . Follow-up    Breathing is doing well. She brought all meds today. Not using albuterol often.    Dyspnea:  MMRC1 = can walk nl pace, flat grade, can't hurry or go uphills or steps s sob   Cough: none/ using mints when singing Sleeping: on 2-3 pillows  SABA use: rarely unless over does it or stops asmanex (can tell if misses a single dose) 02: 2lpm hs   No obvious day to day or daytime variability or assoc excess/ purulent sputum or mucus plugs or hemoptysis or cp or chest tightness, subjective wheeze or overt sinus or hb symptoms.    without nocturnal  or early am exacerbation  of respiratory  c/o's or need for noct saba. Also denies any obvious fluctuation of symptoms with weather or environmental changes or other aggravating or alleviating factors except as outlined above   No unusual exposure hx or h/o childhood pna  or knowledge of premature birth.  Current Allergies, Complete Past Medical History, Past  Surgical History, Family History, and Social History were reviewed in [employer] record.  ROS  The following are not active complaints unless bolded Hoarseness, sore throat, dysphagia, dental problems, itching, sneezing,  nasal congestion or discharge of excess mucus or purulent secretions, ear ache,   fever, chills, sweats, unintended wt loss or wt gain, classically pleuritic or exertional cp,  orthopnea pnd or arm/hand swelling  or leg swelling, presyncope, palpitations, abdominal pain, anorexia, nausea, vomiting, diarrhea  or change in bowel habits or change in bladder habits, change in stools or change in urine, dysuria, hematuria,  rash, arthralgias, visual complaints, headache, numbness, weakness or ataxia or problems with walking or coordination,  change in mood or  memory.        Current Meds  Medication Sig  . Albuterol Sulfate  (PROAIR HFA IN) Inhale 2 puffs into the lungs every 4 (four) hours as needed.  . Albuterol Sulfate (PROAIR RESPICLICK) 811 (90 Base) MCG/ACT AEPB Inhale 2 puffs into the lungs every 4 (four) hours as needed.  . cyclobenzaprine (FLEXERIL) 5 MG tablet Take 2 tablets (10 mg total) by mouth 2 (two) times daily as needed for muscle spasms.  Marland Kitchen diltiazem (CARDIZEM CD) 240 MG 24 hr capsule Take by mouth daily.  Marland Kitchen diltiazem (CARDIZEM) 30 MG tablet Take 30 mg by mouth as needed (palpitations). Max 4 tablets with 24 hours  . esomeprazole (NEXIUM) 20 MG capsule TAKE 1 TABLET BY MOUTH DAILY  . HYDROcodone-acetaminophen (NORCO/VICODIN) 5-325 MG tablet Take 1 tablet by mouth every 4 (four) hours as needed.  Marland Kitchen ibuprofen (ADVIL,MOTRIN) 800 MG tablet Take 1 tablet (800 mg total) by mouth 3 (three) times daily with meals.  Marland Kitchen levocetirizine (XYZAL) 5 MG tablet Take 5 mg by mouth every evening.  . Mometasone Furoate 110 MCG/INH AEPB 2 puffs each am  . Probiotic Product (ALIGN PO) Take 1 capsule by mouth daily.  . rosuvastatin (CRESTOR) 10 MG tablet Take 1 tablet by mouth daily.  . sertraline (ZOLOFT) 50 MG tablet Take 50 mg by mouth daily.   . vitamin B-12 (CYANOCOBALAMIN) 1000 MCG tablet Take 1,000 mcg by mouth daily.              Objective:     amb bf nad  09/27/2018        189   08/15/18 191 lb (86.6 kg)  07/18/18 191 lb (86.6 kg)  07/03/18 190 lb (86.2 kg)    Vital signs reviewed - Note on arrival 02 sats  98% on RA     HEENT: nl dentition, turbinates bilaterally, and oropharynx. Nl external ear canals without cough reflex   NECK :  without JVD/Nodes/TM/ nl carotid upstrokes bilaterally   LUNGS: no acc muscle use,  Nl contour chest which is clear to A and P bilaterally without cough on insp or exp maneuvers   CV:  RRR  no s3 or murmur or increase in P2, and no edema   ABD:  soft and nontender with nl inspiratory excursion in the supine position. No bruits or organomegaly appreciated, bowel  sounds nl  MS:  Nl gait/ ext warm without deformities, calf tenderness, cyanosis or clubbing No obvious joint restrictions   SKIN: warm and dry without lesions    NEURO:  alert, approp, nl sensorium with  no motor or cerebellar deficits apparent.           Assessment

## 2018-09-28 ENCOUNTER — Encounter: Payer: Self-pay | Admitting: Internal Medicine

## 2018-09-28 NOTE — Assessment & Plan Note (Signed)
Onset in her 78's p being told she had asthma as child but no memory of it FENO 07/18/2018  =   14 Spirometry 07/18/2018  FEV1 1.7 (84%)  Ratio 87 s curvature off all rx  - 07/18/2018    trial of asmanex 100  2 bid x one month sample then return to ov > 08/15/2018 reported using only 2 pffs each am and "doing fine"    09/27/2018  After extensive coaching inhaler device,  effectiveness =    90%       Although some elements of her hx are unusual (can tell if misses a single dose of ICS which of course takes up to a week to work), All goals of chronic asthma control met including optimal function and elimination of symptoms with minimal need for rescue therapy.  Contingencies discussed in full including contacting this office immediately if not controlling the symptoms using the rule of two's.      Needs pfts prior to asmanex when returns / no change in rx in meantime   I had an extended discussion with the patient reviewing all relevant studies completed to date and  lasting 15 to 20 minutes of a 25 minute visit    See device teaching which extended face to face time for this visit.  Each maintenance medication was reviewed in detail including emphasizing most importantly the difference between maintenance and prns and under what circumstances the prns are to be triggered using an action plan format that is not reflected in the computer generated alphabetically organized AVS which I have not found useful in most complex patients, especially with respiratory illnesses  Please see AVS for specific instructions unique to this visit that I personally wrote and verbalized to the the pt in detail and then reviewed with pt  by my nurse highlighting any  changes in therapy recommended at today's visit to their plan of care.

## 2018-09-28 NOTE — Assessment & Plan Note (Addendum)
Flared off nexium 08/15/2018 > resume plus diet advised and f/u gi prn   It may turn out she has a significant uacs component as this component has improved on acid suppression though she is not observing diet restrictions and using a dpi saba as rescue (which she fortunately rarely uses)   Upper airway cough syndrome (previously labeled PNDS),  is so named because it's frequently impossible to sort out how much is  CR/sinusitis with freq throat clearing (which can be related to primary GERD)   vs  causing  secondary (" extra esophageal")  GERD from wide swings in gastric pressure that occur with throat clearing, often  promoting self use of mint and menthol lozenges that reduce the lower esophageal sphincter tone and exacerbate the problem further in a cyclical fashion.   These are the same pts (now being labeled as having "irritable larynx syndrome" by some cough centers) who not infrequently have a history of having failed to tolerate ace inhibitors,  dry powder inhalers or biphosphonates or report having atypical/extraesophageal reflux symptoms that don't respond to standard doses of PPI  and are easily confused as having aecopd or asthma flares by even experienced allergists/ pulmonologists (myself included).   Rec:  Continue gerd rx as is for now and observe diet - NO MINTS!

## 2018-10-11 ENCOUNTER — Other Ambulatory Visit: Payer: Self-pay

## 2018-10-11 ENCOUNTER — Encounter: Payer: Self-pay | Admitting: Nurse Practitioner

## 2018-10-11 ENCOUNTER — Ambulatory Visit: Payer: Self-pay | Admitting: Cardiology

## 2018-10-11 ENCOUNTER — Encounter: Payer: Self-pay | Admitting: Cardiology

## 2018-10-11 ENCOUNTER — Ambulatory Visit (INDEPENDENT_AMBULATORY_CARE_PROVIDER_SITE_OTHER): Payer: No Typology Code available for payment source | Admitting: Cardiology

## 2018-10-11 ENCOUNTER — Ambulatory Visit (INDEPENDENT_AMBULATORY_CARE_PROVIDER_SITE_OTHER): Payer: PRIVATE HEALTH INSURANCE | Admitting: Nurse Practitioner

## 2018-10-11 VITALS — BP 134/76 | HR 85 | Temp 98.1°F | Ht 63.4 in | Wt 189.8 lb

## 2018-10-11 VITALS — BP 146/90 | HR 73 | Ht 64.0 in | Wt 189.6 lb

## 2018-10-11 DIAGNOSIS — R7303 Prediabetes: Secondary | ICD-10-CM | POA: Diagnosis not present

## 2018-10-11 DIAGNOSIS — E782 Mixed hyperlipidemia: Secondary | ICD-10-CM

## 2018-10-11 DIAGNOSIS — Z0000 Encounter for general adult medical examination without abnormal findings: Secondary | ICD-10-CM

## 2018-10-11 DIAGNOSIS — I10 Essential (primary) hypertension: Secondary | ICD-10-CM

## 2018-10-11 DIAGNOSIS — I471 Supraventricular tachycardia: Secondary | ICD-10-CM

## 2018-10-11 DIAGNOSIS — F419 Anxiety disorder, unspecified: Secondary | ICD-10-CM | POA: Insufficient documentation

## 2018-10-11 DIAGNOSIS — J4530 Mild persistent asthma, uncomplicated: Secondary | ICD-10-CM | POA: Diagnosis not present

## 2018-10-11 DIAGNOSIS — Z0189 Encounter for other specified special examinations: Secondary | ICD-10-CM | POA: Diagnosis not present

## 2018-10-11 DIAGNOSIS — E7800 Pure hypercholesterolemia, unspecified: Secondary | ICD-10-CM

## 2018-10-11 LAB — POCT URINALYSIS DIPSTICK
Bilirubin, UA: NEGATIVE
Blood, UA: NEGATIVE
Glucose, UA: NEGATIVE
Ketones, UA: NEGATIVE
Leukocytes, UA: NEGATIVE
Nitrite, UA: NEGATIVE
Protein, UA: POSITIVE — AB
Spec Grav, UA: 1.02 (ref 1.010–1.025)
Urobilinogen, UA: 1 E.U./dL
pH, UA: 7.5 (ref 5.0–8.0)

## 2018-10-11 LAB — POCT UA - MICROALBUMIN
Albumin/Creatinine Ratio, Urine, POC: <30
Creatinine, POC: 100 mg/dL
Microalbumin Ur, POC: 30 mg/L

## 2018-10-11 MED ORDER — SERTRALINE HCL 50 MG PO TABS: 50.0000 mg | ORAL_TABLET | Freq: Every day | ORAL | 0 refills | Status: DC

## 2018-10-11 MED ORDER — SERTRALINE HCL 50 MG PO TABS: 50.0000 mg | ORAL_TABLET | Freq: Every day | ORAL | 1 refills | Status: DC

## 2018-10-11 NOTE — Patient Instructions (Addendum)

## 2018-10-11 NOTE — Progress Notes (Signed)
Subjective:  Primary Physician:  Minette Brine, FNP  Patient ID: Kathryn Gallegos, female    DOB: 1955/11/28, 63 y.o.   MRN: [id number]  Chief Complaint  Patient presents with  . Hypertension  . Follow-up    HPI: Kathryn Gallegos  is a 63 y.o. female  with  hypertension, bronchial ashtma, hyperlipidemia, and hyperglycemia seen in the emergency room on 05/07/2017 when she presented with palpitations and chest pain and EKG revealed SVT at the rate of 172 bpm with secondary ST-T wave changes in the inferior and lateral leads suggestive of ischemia. She converted to sinus rhythm with adenosine was and discharged home.  Was evaluated by EP and she preferred medical therapy due to fear of pacemaker implantation. Now on Diltiazem and states she has not had any further episodes.   She was started on Crestor on her last OV and was supposed to obtain labs which she did today with PCP. She is presently tolerating all her medications well and essentially remains asymptomatic.  Past Medical History:  Diagnosis Date  . Anxiety   . Anxiety and depression   . Asthma   . Borderline diabetes   . Carpal tunnel syndrome 07/05/2007   Qualifier: Diagnosis of  By: [name], [name]    . Dyspnea on exertion 09/22/2018  . HIGH BLOOD PRESSURE 07/04/2007   Qualifier: Diagnosis of  By: [name], [name]    . HTN (hypertension)   . Hyperglycemia   . Hyperlipidemia   . IMPINGEMENT SYNDROME 03/09/2010   Qualifier: Diagnosis of  By: [name], [name]    . Obesity   . PSVT (paroxysmal supraventricular tachycardia) ([place]) 09/22/2018  . Pure hypercholesterolemia 09/22/2018  . Rapid palpitations   . Seasonal allergies     Past Surgical History:  Procedure Laterality Date  . BREAST BIOPSY Left   . TOTAL ABDOMINAL HYSTERECTOMY    . TUBAL LIGATION      Social History   Socioeconomic History  . Marital status: Married    Spouse name: Not on file  . Number of children: 0  . Years of education:  2 yr coll.  . Highest education level: Not on file  Occupational History  . Occupation: Psychiatric nurse: [employer]  . Financial resource strain: Not on file  . Food insecurity:    Worry: Not on file    Inability: Not on file  . Transportation needs:    Medical: Not on file    Non-medical: Not on file  Tobacco Use  . Smoking status: Former Smoker    Packs/day: 0.25    Years: 2.00    Pack years: 0.50    Types: Cigarettes    Last attempt to quit: 1970    Years since quitting: 50.1  . Smokeless tobacco: Never Used  Substance and Sexual Activity  . Alcohol use: No  . Drug use: No  . Sexual activity: Yes    Birth control/protection: Surgical    Comment: HYST /MARRIED  Lifestyle  . Physical activity:    Days per week: Not on file    Minutes per session: Not on file  . Stress: Not on file  Relationships  . Social connections:    Talks on phone: Not on file    Gets together: Not on file    Attends religious service: Not on file    Active member of club or organization: Not on file    Attends meetings of clubs  or organizations: Not on file    Relationship status: Not on file  . Intimate partner violence:    Fear of current or ex partner: Not on file    Emotionally abused: Not on file    Physically abused: Not on file    Forced sexual activity: Not on file  Other Topics Concern  . Not on file  Social History Narrative  . Not on file    Current Outpatient Medications on File Prior to Visit  Medication Sig Dispense Refill  . Albuterol Sulfate (PROAIR HFA IN) Inhale 2 puffs into the lungs every 4 (four) hours as needed.    . Albuterol Sulfate (PROAIR RESPICLICK) 740 (90 Base) MCG/ACT AEPB Inhale 2 puffs into the lungs every 4 (four) hours as needed.    . cyclobenzaprine (FLEXERIL) 5 MG tablet Take 2 tablets (10 mg total) by mouth 2 (two) times daily as needed for muscle spasms. 40 tablet 1  . diltiazem (CARDIZEM CD) 240 MG 24 hr capsule  Take by mouth daily.  2  . diltiazem (CARDIZEM) 30 MG tablet Take 30 mg by mouth as needed (palpitations). Max 4 tablets with 24 hours    . esomeprazole (NEXIUM) 20 MG capsule as needed. TAKE 1 TABLET BY MOUTH DAILY    . HYDROcodone-acetaminophen (NORCO/VICODIN) 5-325 MG tablet Take 1 tablet by mouth every 4 (four) hours as needed.    Marland Kitchen ibuprofen (ADVIL,MOTRIN) 800 MG tablet Take 1 tablet (800 mg total) by mouth 3 (three) times daily with meals. (Patient taking differently: Take 800 mg by mouth as needed. ) 90 tablet 1  . levocetirizine (XYZAL) 5 MG tablet Take 5 mg by mouth every evening.    . Probiotic Product (ALIGN PO) Take 1 capsule by mouth daily.    . rosuvastatin (CRESTOR) 10 MG tablet Take 1 tablet by mouth daily.    . sertraline (ZOLOFT) 50 MG tablet Take 1 tablet (50 mg total) by mouth daily. 90 tablet 1  . vitamin B-12 (CYANOCOBALAMIN) 1000 MCG tablet Take 1,000 mcg by mouth daily.    . Mometasone Furoate 110 MCG/INH AEPB 2 puffs each am (Patient not taking: Reported on 10/11/2018) 1 Inhaler 11   No current facility-administered medications on file prior to visit.      Review of Systems  Constitutional: Negative for malaise/fatigue and weight loss.  Respiratory: Positive for shortness of breath (stable) and wheezing (occasional). Negative for cough and hemoptysis.   Cardiovascular: Negative for chest pain, palpitations, claudication and leg swelling.  Gastrointestinal: Negative for abdominal pain, blood in stool, constipation, heartburn and vomiting.  Genitourinary: Negative for dysuria.  Musculoskeletal: Positive for back pain (occasional). Negative for joint pain and myalgias.  Neurological: Negative for dizziness, focal weakness and headaches.  Endo/Heme/Allergies: Does not bruise/bleed easily.  Psychiatric/Behavioral: Negative for depression. The patient is not nervous/anxious.   All other systems reviewed and are negative.      Objective:  Blood pressure (!) 146/90,  pulse 73, height _0  (1.626 m), weight 189 lb 9.6 oz (86 kg), SpO2 94 %. Body mass index is 32.54 kg/m.  Physical Exam  Constitutional: She appears well-developed. No distress.  Mildly obese  HENT:  Head: Atraumatic.  Eyes: Conjunctivae are normal.  Neck: Neck supple. No JVD present. No thyromegaly present.  Cardiovascular: Normal rate, regular rhythm, normal heart sounds and intact distal pulses. Exam reveals no gallop.  No murmur heard. Pulmonary/Chest: Effort normal and breath sounds normal.  Abdominal: Soft. Bowel sounds are normal.  Musculoskeletal: Normal  range of motion.        General: No edema.  Neurological: She is alert.  Skin: Skin is warm and dry.  Psychiatric: She has a normal mood and affect.    CARDIAC STUDIES:   Exercise sestamibi stress test 07/21/2018:  1. The patient performed treadmill exercise using Bruce protocol, completing 6:45 minutes. The patient completed an estimated workload of 8.2 METS, reaching 87% of the maximum predicted heart rate. Resting hypertension 150/100 mmHg with exaggerated exercise BP response, peak BP 230/110 mmHg. No stress symptoms reported. Exercise capacity was normal. No ischemic changes seen on stress electrocardiogram.   2. The overall quality of the study is excellent. There is no evidence of abnormal lung activity. Stress and rest SPECT images demonstrate homogeneous tracer distribution throughout the myocardium. Gated SPECT imaging reveals normal myocardial thickening and wall motion. The left ventricular ejection fraction was normal (73%).  3. Low risk study.  Echocardiogram 07/05/2018: Left ventricle cavity is normal in size. Mild concentric hypertrophy of the left ventricle. Normal global wall motion. Doppler evidence of grade I (impaired) diastolic dysfunction, normal LAP. Calculated EF 67%. Mild (Grade I) mitral regurgitation. Mild tricuspid regurgitation.  No evidence of pulmonary hypertension.  Assessment &  Recommendations:   1. Supraventricular tachycardia, paroxysmal ([place])  2. Asthma, chronic, mild persistent, uncomplicated  3. Essential hypertension EKG 05/14/2017: Normal sinus rhythm at the rate of 71 bpm, normal intervals.  No evidence of ischemia, normal EKG. No change from EKG 10/31/2014.  4. Mild hyperlipidemia  6. Laboratory examination Labs 09/29/2017: Hemoglobin A1c 5.7%.  TSH normal.  Vitamin D normal.  CBC normal.  Creatinine 0.7, EGFR 84/97, potassium 4.3, CMP normal.  Cholesterol 192, triglycerides 92, HDL 58, LDL 116.  Recommendation:   Patient is here on a six-month office visit and follow-up of PSVT, fortunately since being on diltiazem she has not had any further episodes.  I again discussed with her regarding Valsalva strain: I have explained to the patient the mechanism of PSVT, explained Valsalva maneuver, straining, splashing cold water on the face.    Patient also advised to activate EMS.  Also discussed if the usual maneuvers do not improve the symptoms to go to the emergency department.  Blood pressure was minimally elevated today however this morning she had complete physical examination done by her PCP, blood pressure was very well controlled.  She has made significant lifestyle changes, has been trying to lose weight and has been watching her salt intake.  Overall I'm very pleased with the progress.  She is now tolerating statins without any side effects, I'll request PCP to take over the care of the hyperlipidemia.  Otherwise she is stable from cardiac standpoint, I'll see her back in a year.   Adrian Prows, MD, [facility] 10/11/2018, 2:26 PM [facility] [place] Pager: [phone] Office: [phone] If no answer Cell [phone]

## 2018-10-11 NOTE — Progress Notes (Addendum)
Subjective:     Patient ID: ALYCE INSCORE , female    DOB: 1955-11-22 , 63 y.o.   MRN: [id number]   Chief Complaint  Patient presents with  . Annual Exam   The patient states she uses status post hysterectomy for birth control. Last LMP was No LMP recorded. Patient has had a hysterectomy.. Negative for Dysmenorrhea and Negative for Menorrhagia Mammogram last done 10/05/2017.  Negative for: breast discharge, breast lump(s), breast pain and breast self exam.  Pertinent negatives include abnormal bleeding (hematology), anxiety, decreased libido, depression, difficulty falling sleep, dyspareunia, history of infertility, nocturia, sexual dysfunction, sleep disturbances, urinary incontinence, urinary urgency, vaginal discharge and vaginal itching. Diet regular. The patient states her exercise level is  walking 30 minutes every day.     The patient's tobacco use is:  Social History   Tobacco Use  Smoking Status Former Smoker  . Packs/day: 0.25  . Years: 2.00  . Pack years: 0.50  . Types: Cigarettes  . Last attempt to quit: 08/16/1976  . Years since quitting: 42.1  Smokeless Tobacco Never Used   She has been exposed to passive smoke. The patient's alcohol use is:  Social History   Substance and Sexual Activity  Alcohol Use No   Additional information: Last pap January 2019, next one scheduled for January 2020.     HPI  Here for HM - PAP done by Dr. Raphael Gibney January 2019 normal, now will be seeing Earnstine Regal.    Hyperlipidemia - no concerns, tolerating rosuvastatin well.    Diabetes  She presents for her follow-up diabetic visit. Diabetes type: prediabetes. Her disease course has been stable. Pertinent negatives for hypoglycemia include no confusion, dizziness, headaches or nervousness/anxiousness. Pertinent negatives for diabetes include no blurred vision, no fatigue, no polydipsia, no polyphagia, no polyuria and no weight loss. Symptoms are stable. When asked about current  treatments, none were reported. She is compliant with treatment all of the time.     Past Medical History:  Diagnosis Date  . Anxiety   . Anxiety and depression   . Asthma   . Borderline diabetes   . Carpal tunnel syndrome 07/05/2007   Qualifier: Diagnosis of  By: [name], [name]    . Dyspnea on exertion 09/22/2018  . HIGH BLOOD PRESSURE 07/04/2007   Qualifier: Diagnosis of  By: [name], [name]    . HTN (hypertension)   . Hyperglycemia   . Hyperlipidemia   . IMPINGEMENT SYNDROME 03/09/2010   Qualifier: Diagnosis of  By: [name], [name]    . Obesity   . PSVT (paroxysmal supraventricular tachycardia) ([place]) 09/22/2018  . Pure hypercholesterolemia 09/22/2018  . Rapid palpitations   . Seasonal allergies      Family History  Problem Relation Age of Onset  . Pancreatic cancer Mother   . Esophageal cancer Maternal Grandmother   . Heart disease Maternal Grandfather   . Heart disease Maternal Uncle   . Colon cancer Maternal Aunt   . Stomach cancer Maternal Aunt   . Arthritis Unknown   . Cancer Unknown   . Diabetes Unknown   . Breast cancer Neg Hx      Current Outpatient Medications:  .  Albuterol Sulfate (PROAIR RESPICLICK) 250 (90 Base) MCG/ACT AEPB, Inhale 2 puffs into the lungs every 4 (four) hours as needed., Disp: , Rfl:  .  cyclobenzaprine (FLEXERIL) 5 MG tablet, Take 2 tablets (10 mg total) by mouth 2 (two) times daily as needed for muscle spasms., Disp: 40 tablet,  Rfl: 1 .  diltiazem (CARDIZEM CD) 240 MG 24 hr capsule, Take by mouth daily., Disp: , Rfl: 2 .  diltiazem (CARDIZEM) 30 MG tablet, Take 30 mg by mouth as needed (palpitations). Max 4 tablets with 24 hours, Disp: , Rfl:  .  esomeprazole (NEXIUM) 20 MG capsule, TAKE 1 TABLET BY MOUTH DAILY, Disp: , Rfl:  .  HYDROcodone-acetaminophen (NORCO/VICODIN) 5-325 MG tablet, Take 1 tablet by mouth every 4 (four) hours as needed., Disp: , Rfl:  .  ibuprofen (ADVIL,MOTRIN) 800 MG tablet, Take 1 tablet (800 mg total)  by mouth 3 (three) times daily with meals., Disp: 90 tablet, Rfl: 1 .  levocetirizine (XYZAL) 5 MG tablet, Take 5 mg by mouth every evening., Disp: , Rfl:  .  Mometasone Furoate 110 MCG/INH AEPB, 2 puffs each am, Disp: 1 Inhaler, Rfl: 11 .  Probiotic Product (ALIGN PO), Take 1 capsule by mouth daily., Disp: , Rfl:  .  rosuvastatin (CRESTOR) 10 MG tablet, Take 1 tablet by mouth daily., Disp: , Rfl:  .  vitamin B-12 (CYANOCOBALAMIN) 1000 MCG tablet, Take 1,000 mcg by mouth daily., Disp: , Rfl:  .  Albuterol Sulfate (PROAIR HFA IN), Inhale 2 puffs into the lungs every 4 (four) hours as needed., Disp: , Rfl:  .  sertraline (ZOLOFT) 50 MG tablet, Take 1 tablet (50 mg total) by mouth daily., Disp: 90 tablet, Rfl: 0   Allergies  Allergen Reactions  . Amoxicillin Other (See Comments)    Inflammation in pancreas.      Review of Systems  Constitutional: Negative.  Negative for fatigue and weight loss.  HENT: Negative.   Eyes: Negative.  Negative for blurred vision.  Respiratory: Negative.   Cardiovascular: Negative.   Gastrointestinal: Negative.   Endocrine: Negative.  Negative for polydipsia, polyphagia and polyuria.  Genitourinary: Negative.   Musculoskeletal: Negative.   Skin: Negative.   Allergic/Immunologic: Negative.   Neurological: Negative.  Negative for dizziness and headaches.  Hematological: Negative.   Psychiatric/Behavioral: Negative.  Negative for confusion. The patient is not nervous/anxious.      Today's Vitals   10/11/18 1035  BP: 134/76  Pulse: 85  Temp: 98.1 F (36.7 C)  TempSrc: Oral  SpO2: 96%  Weight: 189 lb 12.8 oz (86.1 kg)  Height: 5' 3.4" (1.61 m)   Body mass index is 33.2 kg/m.   Objective:  Physical Exam Vitals signs reviewed.  Constitutional:      Appearance: Normal appearance. She is well-developed.  HENT:     Head: Normocephalic and atraumatic.     Right Ear: Hearing, tympanic membrane, ear canal and external ear normal.     Left Ear:  Hearing, tympanic membrane, ear canal and external ear normal.     Nose: Nose normal.     Mouth/Throat:     Mouth: Mucous membranes are moist.  Eyes:     General: Lids are normal.     Conjunctiva/sclera: Conjunctivae normal.     Pupils: Pupils are equal, round, and reactive to light.     Funduscopic exam:    Right eye: No papilledema.        Left eye: No papilledema.  Neck:     Musculoskeletal: Full passive range of motion without pain, normal range of motion and neck supple.     Thyroid: No thyroid mass.     Vascular: No carotid bruit.  Cardiovascular:     Rate and Rhythm: Normal rate and regular rhythm.     Pulses: Normal pulses.  Heart sounds: Normal heart sounds. No murmur.  Pulmonary:     Effort: Pulmonary effort is normal.     Breath sounds: Normal breath sounds.  Abdominal:     General: Abdomen is flat. Bowel sounds are normal.     Palpations: Abdomen is soft.  Musculoskeletal: Normal range of motion.        General: No swelling.     Right lower leg: No edema.     Left lower leg: No edema.  Skin:    General: Skin is warm and dry.     Capillary Refill: Capillary refill takes less than 2 seconds.  Neurological:     General: No focal deficit present.     Mental Status: She is alert and oriented to person, place, and time.     Cranial Nerves: No cranial nerve deficit.     Sensory: No sensory deficit.  Psychiatric:        Mood and Affect: Mood normal.        Behavior: Behavior normal.        Thought Content: Thought content normal.        Judgment: Judgment normal.         Assessment And Plan:     1. Health maintenance examination . Behavior modifications discussed and diet history reviewed.   . Pt will continue to exercise regularly and modify diet with low GI, plant based foods and decrease intake of processed foods.  . Recommend intake of daily multivitamin, Vitamin D, and calcium.  . Recommend mammogram and colonoscopy for preventive screenings, as well as  recommend immunizations that include influenza, TDAP, and Shingles   2. Essential hypertension . B/P is fairly controlled.  . CMP ordered to check renal function.  . The importance of regular exercise and dietary modification was stressed to the patient.  . Stressed importance of losing ten percent of her body weight to help with B/P control.  . The weight loss would help with decreasing cardiac and cancer risk as well.  . She is also being followed by Dr. Einar Gip for her SVT - CBC no Diff - CMP14 + Anion Gap  3. Mixed hyperlipidemia  Chronic, controlled  Continue with current medications - Lipid panel  4. Prediabetes  Chronic, fair control  No current medications  Encouraged to limit intake of sugary foods and drinks  Encouraged to increase physical activity to 150 minutes per week - HIV antibody (with reflex)  5. Anxiety Chronic, controlled Continue with current medications (Sertraline)   Minette Brine, FNP

## 2018-10-12 ENCOUNTER — Encounter: Payer: Self-pay | Admitting: Nurse Practitioner

## 2018-10-12 LAB — CMP14 + ANION GAP
ALT: 29 IU/L (ref 0–32)
AST: 26 IU/L (ref 0–40)
Albumin/Globulin Ratio: 1.7 (ref 1.2–2.2)
Albumin: 4.7 g/dL (ref 3.8–4.8)
Alkaline Phosphatase: 116 IU/L (ref 39–117)
Anion Gap: 14 mmol/L (ref 10.0–18.0)
BUN/Creatinine Ratio: 13 (ref 12–28)
BUN: 11 mg/dL (ref 8–27)
Bilirubin Total: 0.5 mg/dL (ref 0.0–1.2)
CO2: 25 mmol/L (ref 20–29)
Calcium: 9.7 mg/dL (ref 8.7–10.3)
Chloride: 104 mmol/L (ref 96–106)
Creatinine, Ser: 0.83 mg/dL (ref 0.57–1.00)
GFR calc Af Amer: 88 mL/min/{1.73_m2} (ref >59)
GFR calc non Af Amer: 76 mL/min/{1.73_m2} (ref >59)
Globulin, Total: 2.8 g/dL (ref 1.5–4.5)
Glucose: 110 mg/dL — ABNORMAL HIGH (ref 65–99)
Potassium: 4.3 mmol/L (ref 3.5–5.2)
Sodium: 143 mmol/L (ref 134–144)
Total Protein: 7.5 g/dL (ref 6.0–8.5)

## 2018-10-12 LAB — LIPID PANEL
Chol/HDL Ratio: 2.6 ratio (ref 0.0–4.4)
Cholesterol, Total: 145 mg/dL (ref 100–199)
HDL: 55 mg/dL (ref >39)
LDL Calculated: 73 mg/dL (ref 0–99)
Triglycerides: 87 mg/dL (ref 0–149)
VLDL Cholesterol Cal: 17 mg/dL (ref 5–40)

## 2018-10-12 LAB — CBC
Hematocrit: 43.8 % (ref 34.0–46.6)
Hemoglobin: 14.9 g/dL (ref 11.1–15.9)
MCH: 29.7 pg (ref 26.6–33.0)
MCHC: 34 g/dL (ref 31.5–35.7)
MCV: 87 fL (ref 79–97)
Platelets: 254 10*3/uL (ref 150–450)
RBC: 5.01 x10E6/uL (ref 3.77–5.28)
RDW: 13.4 % (ref 11.7–15.4)
WBC: 7.3 10*3/uL (ref 3.4–10.8)

## 2018-10-12 LAB — HIV ANTIBODY (ROUTINE TESTING W REFLEX): HIV Screen 4th Generation wRfx: NONREACTIVE

## 2018-10-13 ENCOUNTER — Telehealth: Payer: Self-pay

## 2018-10-13 LAB — HEMOGLOBIN A1C
Est. average glucose Bld gHb Est-mCnc: 117 mg/dL
Hgb A1c MFr Bld: 5.7 % — ABNORMAL HIGH (ref 4.8–5.6)

## 2018-10-13 LAB — SPECIMEN STATUS REPORT

## 2018-10-13 NOTE — Telephone Encounter (Signed)
-----   Message from Minette Brine, [place] sent at 10/12/2018  7:35 AM EST ----- HIV is negative.  Your kidney functions are normal.  Blood levels are normal. Your cholesterol levels 145 goal is less than 199,  triglycerides levels are 87 goal is less than 150, LDL level is 73 goal is less than 99 limit your intake of fried and fatty foods.  I am adding a HgbA1c for diabetes risk.

## 2018-10-13 NOTE — Telephone Encounter (Signed)
1st attempt to give results 

## 2018-10-16 ENCOUNTER — Ambulatory Visit: Payer: PRIVATE HEALTH INSURANCE | Admitting: Orthopedic Surgery

## 2018-10-18 ENCOUNTER — Encounter: Payer: Self-pay | Admitting: Orthopedic Surgery

## 2018-10-18 ENCOUNTER — Ambulatory Visit (INDEPENDENT_AMBULATORY_CARE_PROVIDER_SITE_OTHER): Payer: No Typology Code available for payment source | Admitting: Orthopedic Surgery

## 2018-10-18 DIAGNOSIS — G8929 Other chronic pain: Secondary | ICD-10-CM | POA: Diagnosis not present

## 2018-10-18 DIAGNOSIS — M25511 Pain in right shoulder: Secondary | ICD-10-CM

## 2018-10-18 MED ORDER — HYDROCODONE-ACETAMINOPHEN 5-325 MG PO TABS: 1.0000 | ORAL_TABLET | ORAL | 0 refills | Status: DC | PRN

## 2018-10-18 NOTE — Patient Instructions (Signed)
Continue physical therapy x 10 visits   Continue work restriction for 4 weeks

## 2018-10-18 NOTE — Progress Notes (Addendum)
    Chief Complaint  Patient presents with  . Shoulder Problem    Partial rotator cuff tear    63 year old female presents back for reevaluation of her right shoulder.  She has a partial rotator cuff tear with treating with therapy.  Her pain is well controlled with hydrocodone on a as needed basis and physical therapy seems to be working well.  She still has pain in the upper deltoid lower deltoid area  Review of systems neurologic symptoms are negative  Exam right shoulder Tenderness over the deltoid insertion Active range of motion is painful at 150 degrees of flexion which she carries through 180 degrees of forward elevation in the scapular plane or external rotation is normal slight deficit in internal rotation straight abduction is normal but painful Mild weakness and pain when the empty can position Neurovascular exam is intact  Left shoulder no weakness or pain in the empty can position   Encounter Diagnosis  Name Primary?  . Chronic right shoulder pain partial rotator cuff tear Yes   Recommend continue physical therapy for 4 weeks continue work restrictions for 4 weeks we're making good progress Work restrictions: production number will have to be limited 2ndary to rct rt shoulder   Follow-up in 4 weeks

## 2018-10-20 ENCOUNTER — Ambulatory Visit
Admission: RE | Admit: 2018-10-20 | Discharge: 2018-10-20 | Disposition: A | Discharge status: Home / Self Care | Payer: Commercial Managed Care - PPO | Source: Ambulatory Visit | Attending: Obstetrics and Gynecology | Admitting: Obstetrics and Gynecology

## 2018-10-20 DIAGNOSIS — Z1231 Encounter for screening mammogram for malignant neoplasm of breast: Secondary | ICD-10-CM

## 2018-10-25 ENCOUNTER — Other Ambulatory Visit: Payer: Self-pay

## 2018-10-25 ENCOUNTER — Ambulatory Visit (INDEPENDENT_AMBULATORY_CARE_PROVIDER_SITE_OTHER): Payer: No Typology Code available for payment source | Admitting: Orthopedic Surgery

## 2018-10-25 VITALS — BP 121/81 | HR 85 | Ht 64.0 in | Wt 185.0 lb

## 2018-10-25 DIAGNOSIS — M25511 Pain in right shoulder: Secondary | ICD-10-CM

## 2018-10-25 DIAGNOSIS — M542 Cervicalgia: Secondary | ICD-10-CM | POA: Diagnosis not present

## 2018-10-25 DIAGNOSIS — G8929 Other chronic pain: Secondary | ICD-10-CM

## 2018-10-25 DIAGNOSIS — S46011D Strain of muscle(s) and tendon(s) of the rotator cuff of right shoulder, subsequent encounter: Secondary | ICD-10-CM

## 2018-10-25 DIAGNOSIS — M47812 Spondylosis without myelopathy or radiculopathy, cervical region: Secondary | ICD-10-CM

## 2018-10-25 MED ORDER — CYCLOBENZAPRINE HCL 5 MG PO TABS: 10.0000 mg | ORAL_TABLET | Freq: Two times a day (BID) | ORAL | 1 refills | Status: DC | PRN

## 2018-10-25 NOTE — Progress Notes (Signed)
Chief Complaint  Patient presents with  . Follow-up    Recheck on right chronic rotator cuff tear.   63 year old female with known right rotator cuff tear was doing well until Saturday when she was walking in [facility] and the arm gave out she had increased pain could move her arm despite taking ibuprofen and Vicodin still no improvement.  She would like a refill on her Flexeril  Review of systems she has neck degenerative disc disease but no acute neck pain or radicular pain although her pain does radiate to her right elbow  Review of systems no numbness or tingling in the arm  BP 121/81   Pulse 85   Ht 5\' 4"  (1.626 m)   Wt 185 lb (83.9 kg)   BMI 31.76 kg/m  Physical Exam Vitals signs reviewed.  Constitutional:      General: She is in acute distress.     Appearance: Normal appearance. She is well-developed and normal weight.  Musculoskeletal:     Right shoulder: She exhibits decreased range of motion, tenderness, pain, spasm and decreased strength. She exhibits no crepitus and normal pulse.  Neurological:     Mental Status: She is alert and oriented to person, place, and time.  Psychiatric:        Attention and Perception: Attention normal.        Mood and Affect: Mood and affect normal.        Speech: Speech normal.        Behavior: Behavior normal.        Thought Content: Thought content normal.        Judgment: Judgment normal.    Acute on chronic shoulder pain with underlying cervical spine disease Encounter Diagnoses  Name Primary?  . Chronic right shoulder pain   . Neck pain   . Cervical spondylosis without myelopathy Yes  . Acute pain of right shoulder   . Traumatic incomplete tear of right rotator cuff, subsequent encounter    Recommend sling for 7 days Ice 3 times a day for 7 days Out of work 7 days Flexeril refill Follow-up 1 week if no improvement recommend surgical repair of the rotator cuff

## 2018-10-25 NOTE — Patient Instructions (Signed)
Out of work for 7 days  Sling for 7 days  Ice for 7 days  Follow-up 1 week

## 2018-10-27 ENCOUNTER — Ambulatory Visit: Payer: 59 | Admitting: Pulmonary Disease

## 2018-11-01 ENCOUNTER — Ambulatory Visit (INDEPENDENT_AMBULATORY_CARE_PROVIDER_SITE_OTHER): Payer: No Typology Code available for payment source | Admitting: Orthopedic Surgery

## 2018-11-01 ENCOUNTER — Other Ambulatory Visit: Payer: Self-pay

## 2018-11-01 ENCOUNTER — Encounter: Payer: Self-pay | Admitting: Orthopedic Surgery

## 2018-11-01 VITALS — BP 139/89 | HR 89 | Ht 64.0 in | Wt 185.0 lb

## 2018-11-01 DIAGNOSIS — S46011D Strain of muscle(s) and tendon(s) of the rotator cuff of right shoulder, subsequent encounter: Secondary | ICD-10-CM | POA: Diagnosis not present

## 2018-11-01 DIAGNOSIS — M47812 Spondylosis without myelopathy or radiculopathy, cervical region: Secondary | ICD-10-CM | POA: Diagnosis not present

## 2018-11-01 DIAGNOSIS — G8929 Other chronic pain: Secondary | ICD-10-CM

## 2018-11-01 DIAGNOSIS — M25511 Pain in right shoulder: Secondary | ICD-10-CM

## 2018-11-01 NOTE — Progress Notes (Signed)
Chief Complaint  Patient presents with  . Follow-up    Recheck on right shoulder.    63 year old female presents back after 1 week in a sling taking ibuprofen hydrocodone and Flexeril with no improvement.  Normally we would schedule her for surgery to repair her rotator cuff but the hospital system and government have advised strongly against non-urgent or emergent surgeries over the next 4 weeks or if surgery is delayed  Physical Exam Musculoskeletal:       Arms:     Encounter Diagnoses  Name Primary?  . Chronic right shoulder pain   . Cervical spondylosis without myelopathy   . Traumatic incomplete tear of right rotator cuff, subsequent encounter Yes    She will be out of work the next 4 weeks We will re-see her in 4 weeks Reschedule surgery 4 weeks or more from now

## 2018-11-01 NOTE — Patient Instructions (Signed)
No work for 4 weeks  Surgery delayed secondary to coronavirus  Follow-up 4 weeks to reassess

## 2018-11-10 ENCOUNTER — Ambulatory Visit: Payer: Commercial Managed Care - PPO | Admitting: Internal Medicine

## 2018-11-15 ENCOUNTER — Ambulatory Visit: Payer: PRIVATE HEALTH INSURANCE | Admitting: Orthopedic Surgery

## 2018-11-21 ENCOUNTER — Other Ambulatory Visit: Payer: Self-pay

## 2018-11-21 MED ORDER — ROSUVASTATIN CALCIUM 10 MG PO TABS: 10.0000 mg | ORAL_TABLET | Freq: Every day | ORAL | 0 refills | Status: DC

## 2018-11-29 ENCOUNTER — Other Ambulatory Visit: Payer: Self-pay

## 2018-11-29 ENCOUNTER — Ambulatory Visit (INDEPENDENT_AMBULATORY_CARE_PROVIDER_SITE_OTHER): Payer: No Typology Code available for payment source | Admitting: Orthopedic Surgery

## 2018-11-29 DIAGNOSIS — S46011D Strain of muscle(s) and tendon(s) of the rotator cuff of right shoulder, subsequent encounter: Secondary | ICD-10-CM | POA: Diagnosis not present

## 2018-11-29 DIAGNOSIS — S46011A Strain of muscle(s) and tendon(s) of the rotator cuff of right shoulder, initial encounter: Secondary | ICD-10-CM | POA: Insufficient documentation

## 2018-11-29 NOTE — Progress Notes (Signed)
Virtual Visit via Telephone Note  I connected with Kathryn Gallegos on 11/29/18 at  2:20 PM EDT by telephone and verified that I am speaking with the correct person using two identifiers.   I discussed the limitations, risks, security and privacy concerns of performing an evaluation and management service by telephone and the availability of in person appointments. I also discussed with the patient that there may be a patient responsible charge related to this service. The patient expressed understanding and agreed to proceed.   I discussed the assessment and treatment plan with the patient. The patient was provided an opportunity to ask questions and all were answered. The patient agreed with the plan and demonstrated an understanding of the instructions.   The patient was advised to call back or seek an in-person evaluation if the symptoms worsen or if the condition fails to improve as anticipated.  I provided 10 minutes of non-face-to-face time during this encounter.   Arther Abbott, MD    Arther Abbott, MD  Virtual visit  62 year old female has a right rotator cuff tear needs surgery.  COVID-19 recommendations and restrictions prevent [place] from fixing her rotator cuff tear.  She is unable to work.  She is having pain requiring opioid medication Norco 5 mg on a every 6 to 8 basis.  She says is her shoulder is still hurting it is hard for her to lift a coffee cup she cannot carry her hand bag on her right side.  I think at this point basically regarding keep her out of work I will talk to her again on May 6 anticipating extending her out of work note through August anticipating that surgery will be done in June    OOW-NOTE  November 01, 2018   Patient: Kathryn Gallegos  Date of Birth: 07/22/1956  Date of Visit: 11/01/2018   To Whom It May Concern:  It is my medical opinion that Kathryn Gallegos is to be out of work as follows:  Effective 10/23/2018,   Out of work through at  least 11/29/2018, to re-evaluate/re-assess  for surgery (postponed due to corona virus pandemic)  or until further notice.  If you have any questions or concerns, please don't hesitate to call.  Sincerely,   Demetrius Revel, MD   Kathryn Gallegos

## 2018-12-20 ENCOUNTER — Ambulatory Visit (INDEPENDENT_AMBULATORY_CARE_PROVIDER_SITE_OTHER): Payer: No Typology Code available for payment source | Admitting: Orthopedic Surgery

## 2018-12-20 ENCOUNTER — Other Ambulatory Visit: Payer: Self-pay

## 2018-12-20 DIAGNOSIS — S46011D Strain of muscle(s) and tendon(s) of the rotator cuff of right shoulder, subsequent encounter: Secondary | ICD-10-CM | POA: Diagnosis not present

## 2018-12-20 NOTE — Progress Notes (Signed)
Virtual Visit via Telephone Note  I connected with Kathryn Gallegos on 12/20/18 at  1:30 PM EDT by telephone and verified that I am speaking with the correct person using two identifiers.  Location: Patient: home Provider: office   I discussed the limitations, risks, security and privacy concerns of performing an evaluation and management service by telephone and the availability of in person appointments. I also discussed with the patient that there may be a patient responsible charge related to this service. The patient expressed understanding and agreed to proceed.    I discussed the assessment and treatment plan with the patient. The patient was provided an opportunity to ask questions and all were answered. The patient agreed with the plan and demonstrated an understanding of the instructions.   The patient was advised to call back or seek an in-person evaluation if the symptoms worsen or if the condition fails to improve as anticipated.  I provided 5-7 minutes of non-face-to-face time during this encounter.  Chief complaint pain right shoulder  History she is 63 years old has a right cuff tear she needs surgery.  COVID-19 recommendations and restrictions prevent [place] from doing the surgery.  She had a episode of increased pain in the right upper extremity radiating into the arm which we suspect is a combination of a rotator cuff tear in her cervical spondylosis  She had to take more pain medication than usual  But she is now in less pain  She is out of work from May 6 through September 1 and the surgery is planned for May 26 or to June 2  She is agreeable  The procedure has been fully reviewed with the patient; The risks and benefits of surgery have been discussed and explained and understood. Alternative treatment has also been reviewed, questions were encouraged and answered. The postoperative plan is also been reviewed.  Planned procedure  Arthroscopy right shoulder  followed by open rotator cuff repair with bursectomy  Does not need acromioplasty or AC joint surgery Encounter Diagnoses  Name Primary?  . Traumatic incomplete tear of right rotator cuff, subsequent encounter Yes  . Cervical spondylosis without myelopathy      No orders of the defined types were placed in this encounter.   Arther Abbott, MD

## 2018-12-21 ENCOUNTER — Encounter: Payer: Self-pay | Admitting: Orthopedic Surgery

## 2018-12-21 ENCOUNTER — Telehealth: Payer: Self-pay | Admitting: Radiology

## 2018-12-21 NOTE — Telephone Encounter (Signed)
-----   Message from [name], MD sent at 12/20/2018  1:37 PM EDT ----- Kathryn Gallegos I want to do an arthroscopy right shoulder open rotator cuff repair on either May 26 or June 2

## 2018-12-22 NOTE — Telephone Encounter (Signed)
-----   Message from [name], MD sent at 12/20/2018  1:37 PM EDT ----- Blima Jaimes I want to do an arthroscopy right shoulder open rotator cuff repair on either May 26 or June 2  Left message for patient to call me back and let me know if May 26 is okay

## 2018-12-25 ENCOUNTER — Other Ambulatory Visit: Payer: Self-pay | Admitting: Orthopedic Surgery

## 2018-12-25 NOTE — Telephone Encounter (Signed)
May 26th is good for her.

## 2018-12-25 NOTE — Telephone Encounter (Signed)
Left another message for her to call back. Let me know about surgery date.

## 2018-12-26 ENCOUNTER — Telehealth: Payer: Self-pay | Admitting: Radiology

## 2018-12-26 NOTE — Telephone Encounter (Signed)
No precert required for Shoulder surgery on May 26th 2020 CPT code [phone]. Spoke to Fritz Pickerel Ref # for the call is [id number]

## 2019-01-02 ENCOUNTER — Other Ambulatory Visit: Payer: Self-pay | Admitting: Cardiology

## 2019-01-03 ENCOUNTER — Other Ambulatory Visit: Payer: Self-pay | Admitting: Orthopedic Surgery

## 2019-01-04 NOTE — Patient Instructions (Signed)
Kathryn Gallegos  01/04/2019     @PREFPERIOPPHARMACY @   Your procedure is scheduled on  01/09/2019   Report to [facility] at  615  A.M.  Call this number if you have problems the morning of surgery:  [phone]   Remember:  Do not eat or drink after midnight.                       Take these medicines the morning of surgery with A SIP OF WATER  Flexaril(if needed), diltiazem, nexium, hydrocodone(if needed), zoloft. Use your inhalers before you come.    Do not wear jewelry, make-up or nail polish.  Do not wear lotions, powders, or perfumes, or deodorant.  Do not shave 48 hours prior to surgery.  Men may shave face and neck.  Do not bring valuables to the hospital.  [facility] is not responsible for any belongings or valuables.  Contacts, dentures or bridgework may not be worn into surgery.  Leave your suitcase in the car.  After surgery it may be brought to your room.  For patients admitted to the hospital, discharge time will be determined by your treatment team.  Patients discharged the day of surgery will not be allowed to drive home.   Name and phone number of your driver:   family Special instructions:  None  Please read over the following fact sheets that you were given. Anesthesia Post-op Instructions and Care and Recovery After Surgery       Shoulder Arthroscopy, Care After This sheet gives you information about how to care for yourself after your procedure. Your health care provider may also give you more specific instructions. If you have problems or questions, contact your health care provider. What can I expect after the procedure? After the procedure, it is common to have:  Pain that can be relieved by taking pain medicine.  Swelling.  A small amount of fluid from the incision.  Stiffness that improves over time. Follow these instructions at home: If you have a sling or immobilizer:  Wear the sling or immobilizer as told by your  health care provider. Remove it only as told by your health care provider. These devices protect your shoulder and help it heal by keeping it in place.  Loosen the sling or immobilizer if your fingers tingle, become numb, or turn cold and blue.  Keep the sling or immobilizer clean.  Ask if you may remove the sling or immobilizer for bathing. If you need to keep it on while bathing and it is not waterproof: ? Do not let it get wet. ? Cover it with a watertight covering when you take a bath or a shower. Incision care   Follow instructions from your health care provider about how to take care of your incisions. Make sure you: ? Wash your hands with soap and water before you change your bandage (dressing). If soap and water are not available, use hand sanitizer. ? Change your dressing as told by your health care provider. ? Leave stitches (sutures), staples, skin glue, or adhesive strips in place. These skin closures may need to stay in place for 2 weeks or longer. If adhesive strip edges start to loosen and curl up, you may trim the loose edges. Do not remove adhesive strips completely unless your health care provider tells you to do that.  Check your incision areas every day for signs of infection. Check for: ?  Redness ? More swelling or pain. ? Blood or more fluid. ? Warmth. ? Pus or a bad smell. Bathing  Do not take baths, swim, or use a hot tub until your health care provider approves. Ask your health care provider if you may take showers. You may only be allowed to take sponge baths. Activity  Ask your health care provider what activities are safe for you during recovery, and ask what activities you need to avoid.  Do not lift with your affected shoulder until your health care provider approves.  Avoid pulling and pushing with the arm on your affected side.  If physical therapy was prescribed, do exercises as directed. Doing exercises may help to improve shoulder movement and  flexibility (range of motion). Driving  Do not drive until your health care provider approves.  Do not drive or use heavy machinery while taking prescription pain medicine. Managing pain, stiffness, and swelling   If lying down flat causes shoulder discomfort, it may help to sleep in a sitting position for a few days after your procedure. Try sleeping in a reclining chair or propping yourself up with extra pillows in bed.  If directed, put ice on the affected area: ? Put ice in a plastic bag or use the icing device (cold therapy unit) that you were given. Follow instructions from your health care provider about how to use the icing device. ? Place a towel between your skin and the bag or between your skin and the icing device. ? Leave the ice on for 20 minutes, 2-3 times a day.  Move your fingers often to avoid stiffness and to lessen swelling. General instructions  Take over-the-counter and prescription medicines only as told by your health care provider.  If you are taking prescription pain medicine, take actions to prevent or treat constipation. Your health care provider may recommend that you: ? Drink enough fluid to keep your urine pale yellow. ? Eat foods that are high in fiber, such as fresh fruits and vegetables, whole grains, and beans. ? Limit foods that are high in fat and processed sugars, such as fried or sweet foods. ? Take an over-the-counter or prescription medicine for constipation.  Do not use any products that contain nicotine or tobacco, such as cigarettes and e-cigarettes. These can delay incision or bone healing. If you need help quitting, ask your health care provider.  Keep all follow-up visits as told by your health care provider. This is important. Contact a health care provider if you:  Have a fever.  Have severe pain.  Have redness around an incision.  Have more swelling or pain in an incision area.  Have blood or more fluid coming from an  incision.  Notice that an incision feels warm to the touch.  Notice pus or a bad smell coming from an incision.  Notice that an incision has opened up.  Develop a rash. Get help right away if you:  Have difficulty breathing.  Have chest pain.  Notice that your fingers tingle, are numb, or are cold and blue even after you loosen your sling or immobilizer.  Develop pain in your lower leg or at the back of your knee. Summary  If you have a sling or immobilizer, wear it as told by your health care provider. These devices protect your shoulder and help it heal by keeping it in place.  If lying down flat causes shoulder discomfort, it may help to sleep in a sitting position for a few days  after your procedure. Try sleeping in a reclining chair, or try propping yourself up with extra pillows in bed.  If physical therapy was prescribed, do exercises as directed. Doing exercises may help to improve shoulder movement and flexibility (range of motion).  Keep all follow-up visits as told by your health care provider. This is important. This information is not intended to replace advice given to you by your health care provider. Make sure you discuss any questions you have with your health care provider. Document Released: 02/27/2014 Document Revised: 06/17/2017 Document Reviewed: 06/17/2017 Elsevier Interactive Patient Education  2019 [place] Anesthesia, Adult, Care After This sheet gives you information about how to care for yourself after your procedure. Your health care provider may also give you more specific instructions. If you have problems or questions, contact your health care provider. What can I expect after the procedure? After the procedure, the following side effects are common:  Pain or discomfort at the IV site.  Nausea.  Vomiting.  Sore throat.  Trouble concentrating.  Feeling cold or chills.  Weak or tired.  Sleepiness and fatigue.  Soreness  and body aches. These side effects can affect parts of the body that were not involved in surgery. Follow these instructions at home:  For at least 24 hours after the procedure:  Have a responsible adult stay with you. It is important to have someone help care for you until you are awake and alert.  Rest as needed.  Do not: ? Participate in activities in which you could fall or become injured. ? Drive. ? Use heavy machinery. ? Drink alcohol. ? Take sleeping pills or medicines that cause drowsiness. ? Make important decisions or sign legal documents. ? Take care of children on your own. Eating and drinking  Follow any instructions from your health care provider about eating or drinking restrictions.  When you feel hungry, start by eating small amounts of foods that are soft and easy to digest (bland), such as toast. Gradually return to your regular diet.  Drink enough fluid to keep your urine pale yellow.  If you vomit, rehydrate by drinking water, juice, or clear broth. General instructions  If you have sleep apnea, surgery and certain medicines can increase your risk for breathing problems. Follow instructions from your health care provider about wearing your sleep device: ? Anytime you are sleeping, including during daytime naps. ? While taking prescription pain medicines, sleeping medicines, or medicines that make you drowsy.  Return to your normal activities as told by your health care provider. Ask your health care provider what activities are safe for you.  Take over-the-counter and prescription medicines only as told by your health care provider.  If you smoke, do not smoke without supervision.  Keep all follow-up visits as told by your health care provider. This is important. Contact a health care provider if:  You have nausea or vomiting that does not get better with medicine.  You cannot eat or drink without vomiting.  You have pain that does not get better with  medicine.  You are unable to pass urine.  You develop a skin rash.  You have a fever.  You have redness around your IV site that gets worse. Get help right away if:  You have difficulty breathing.  You have chest pain.  You have blood in your urine or stool, or you vomit blood. Summary  After the procedure, it is common to have a sore throat or nausea. It  is also common to feel tired.  Have a responsible adult stay with you for the first 24 hours after general anesthesia. It is important to have someone help care for you until you are awake and alert.  When you feel hungry, start by eating small amounts of foods that are soft and easy to digest (bland), such as toast. Gradually return to your regular diet.  Drink enough fluid to keep your urine pale yellow.  Return to your normal activities as told by your health care provider. Ask your health care provider what activities are safe for you. This information is not intended to replace advice given to you by your health care provider. Make sure you discuss any questions you have with your health care provider. Document Released: 11/08/2000 Document Revised: 03/18/2017 Document Reviewed: 03/18/2017 Elsevier Interactive Patient Education  2019 [place].   How to Use Chlorhexidine Before Surgery Chlorhexidine gluconate (CHG) is a germ-killing (antiseptic) solution that is used to clean the skin. It gets rid of the bacteria that normally live on the skin. Cleaning your skin with CHG before surgery helps lower the risk for infection after surgery. To clean your skin before surgery, you may be given:  A CHG solution to use in the shower.  A prepackaged cloth that contains CHG. What are the risks? Risks of using CHG include:  A skin reaction.  Hearing loss, if CHG gets in your ears.  Eye injury, if CHG gets in your eyes and is not rinsed out.  The CHG product catching fire. Make sure that you avoid smoking and flames after  applying CHG to your skin. Do not use CHG:  If you have a chlorhexidine allergy or have previously reacted to chlorhexidine.  On babies younger than 44 months of age. How to use CHG solution   Use CHG only as told by your health care provider, and follow the instructions on the label.  Use CHG solution while taking a shower. Follow these steps when using CHG solution (unless your health care provider gives you different instructions): 1. Start the shower. 2. Use your normal soap and shampoo to wash your face and hair. 3. Turn off the shower or move out of the shower stream. 4. Pour the CHG onto a clean washcloth. Do not use any type of brush or rough-edged sponge. 5. Starting at your neck, lather your body down to your toes. Make sure you:  Pay special attention to the part of your body where you will be having surgery. Scrub this area for at least 1 minute.  Use the full amount of CHG as directed. Usually, this is one bottle.  Do not use CHG on your head or face. If the solution gets into your ears or eyes, rinse them well with water.  Avoid your genital area.  Avoid any areas of skin that have broken skin, cuts, or scrapes.  Scrub your back and under your arms. Make sure to wash skin folds. 6. Let the lather sit on your skin for 1-2 minutes or as long as told by your health care provider. 7. Thoroughly rinse your entire body in the shower. Make sure that all body creases and crevices are rinsed well. 8. Dry off with a clean towel. Do not put any substances on your body afterward, such as powder, lotion, or perfume. 9. Put on clean clothes or pajamas. 10. If it is the night before your surgery, sleep in clean sheets. How to use CHG prepackaged cloths  Only use CHG cloths as told by your health care provider, and follow the instructions on the label.  Use the CHG cloth on clean, dry skin. Follow these steps when using a CHG cloth (unless your health care provider gives you  different instructions): 1. Using the CHG cloth, vigorously scrub the part of your body where you will be having surgery. Scrub using a back-and-forth motion for 3 minutes. The area on your body should be completely wet with CHG when you are done scrubbing. 2. Do not rinse. Discard the cloth and let the area air-dry for 1 minute. Do not put any substances on your body afterward, such as powder, lotion, or perfume. 3. Put on clean clothes or pajamas. 4. If it is the night before your surgery, sleep in clean sheets. Contact a health care provider if:  Your skin gets irritated after scrubbing.  You have questions about using your solution or cloth. Get help right away if:  Your eyes become very red or swollen.  Your eyes itch badly.  Your skin itches badly and is red or swollen.  Your hearing changes.  You have trouble seeing.  You have swelling or tingling in your mouth or throat.  You have trouble breathing.  You swallow any chlorhexidine. Summary  Chlorhexidine gluconate (CHG) is a germ-killing (antiseptic) solution that is used to clean the skin. Cleaning your skin with CHG before surgery helps lower the risk for infection after surgery.  You may be given CHG to use at home. It may be in a bottle or in a prepackaged cloth to use on your skin. Carefully follow your health care provider's instructions and the instructions on the product label.  Do not use CHG if you have a chlorhexidine allergy.  Contact your health care provider if your skin gets irritated after scrubbing. This information is not intended to replace advice given to you by your health care provider. Make sure you discuss any questions you have with your health care provider. Document Released: 04/26/2012 Document Revised: 06/30/2017 Document Reviewed: 06/30/2017 Elsevier Interactive Patient Education  2019 [employer].

## 2019-01-05 ENCOUNTER — Encounter (HOSPITAL_COMMUNITY): Payer: Self-pay

## 2019-01-05 ENCOUNTER — Other Ambulatory Visit: Payer: Self-pay

## 2019-01-05 ENCOUNTER — Encounter (HOSPITAL_COMMUNITY)
Admission: RE | Admit: 2019-01-05 | Discharge: 2019-01-05 | Disposition: A | Discharge status: Home / Self Care | Payer: No Typology Code available for payment source | Source: Ambulatory Visit | Attending: Orthopedic Surgery | Admitting: Orthopedic Surgery

## 2019-01-05 ENCOUNTER — Other Ambulatory Visit (HOSPITAL_COMMUNITY)
Admission: RE | Admit: 2019-01-05 | Discharge: 2019-01-05 | Disposition: A | Discharge status: Home / Self Care | Payer: No Typology Code available for payment source | Source: Ambulatory Visit | Attending: Orthopedic Surgery | Admitting: Orthopedic Surgery

## 2019-01-05 DIAGNOSIS — Z01812 Encounter for preprocedural laboratory examination: Secondary | ICD-10-CM | POA: Diagnosis present

## 2019-01-05 DIAGNOSIS — Z1159 Encounter for screening for other viral diseases: Secondary | ICD-10-CM | POA: Diagnosis not present

## 2019-01-05 LAB — CBC WITH DIFFERENTIAL/PLATELET
Abs Immature Granulocytes: 0.01 10*3/uL (ref 0.00–0.07)
Basophils Absolute: 0.1 10*3/uL (ref 0.0–0.1)
Basophils Relative: 1 %
Eosinophils Absolute: 0.2 10*3/uL (ref 0.0–0.5)
Eosinophils Relative: 4 %
HCT: 44.7 % (ref 36.0–46.0)
Hemoglobin: 14.7 g/dL (ref 12.0–15.0)
Immature Granulocytes: 0 %
Lymphocytes Relative: 38 %
Lymphs Abs: 2.5 10*3/uL (ref 0.7–4.0)
MCH: 28.9 pg (ref 26.0–34.0)
MCHC: 32.9 g/dL (ref 30.0–36.0)
MCV: 88 fL (ref 80.0–100.0)
Monocytes Absolute: 0.6 10*3/uL (ref 0.1–1.0)
Monocytes Relative: 9 %
Neutro Abs: 3.2 10*3/uL (ref 1.7–7.7)
Neutrophils Relative %: 48 %
Platelets: 216 10*3/uL (ref 150–400)
RBC: 5.08 MIL/uL (ref 3.87–5.11)
RDW: 13.2 % (ref 11.5–15.5)
WBC: 6.6 10*3/uL (ref 4.0–10.5)
nRBC: 0 % (ref 0.0–0.2)

## 2019-01-05 LAB — BASIC METABOLIC PANEL
Anion gap: 9 (ref 5–15)
BUN: 12 mg/dL (ref 8–23)
CO2: 27 mmol/L (ref 22–32)
Calcium: 9 mg/dL (ref 8.9–10.3)
Chloride: 102 mmol/L (ref 98–111)
Creatinine, Ser: 0.69 mg/dL (ref 0.44–1.00)
GFR calc Af Amer: >60 mL/min (ref >60)
GFR calc non Af Amer: >60 mL/min (ref >60)
Glucose, Bld: 110 mg/dL — ABNORMAL HIGH (ref 70–99)
Potassium: 3.3 mmol/L — ABNORMAL LOW (ref 3.5–5.1)
Sodium: 138 mmol/L (ref 135–145)

## 2019-01-06 LAB — NOVEL CORONAVIRUS, NAA (HOSP ORDER, SEND-OUT TO REF LAB; TAT 18-24 HRS): SARS-CoV-2, NAA: NOT DETECTED

## 2019-01-08 NOTE — H&P (Addendum)
Kathryn Gallegos is an 63 y.o. female.   Chief Complaint: pain right shoulder  HPI: 63 yo female with several month history of right shoulder pain which began after she fell during an encounter with a dog. However, she broke her left humerus at the time and it demanded all of her attention. After it healed, she noticed that her right shoulder was hurting and she was losing strength and range of motion.   After non operative treatment failed (nsaids, oral opioids injection and activity modification) an mri was obtained. It revealed a rotator cuff tear of the right shoulder. After reviewing her options she decided to proceed with surgery (which was delayed due to [phone])   Past Medical History:  Diagnosis Date  . Anxiety   . Anxiety and depression   . Asthma   . Borderline diabetes   . Carpal tunnel syndrome 07/05/2007   Qualifier: Diagnosis of  By: [name], [name]    . Dyspnea on exertion 09/22/2018  . HIGH BLOOD PRESSURE 07/04/2007   Qualifier: Diagnosis of  By: [name], [name]    . HTN (hypertension)   . Hyperglycemia   . Hyperlipidemia   . IMPINGEMENT SYNDROME 03/09/2010   Qualifier: Diagnosis of  By: [name], [name]    . Obesity   . PSVT (paroxysmal supraventricular tachycardia) ([place]) 09/22/2018  . Pure hypercholesterolemia 09/22/2018  . Rapid palpitations   . Seasonal allergies     Past Surgical History:  Procedure Laterality Date  . BREAST BIOPSY Left   . TOTAL ABDOMINAL HYSTERECTOMY    . TUBAL LIGATION      Family History  Problem Relation Age of Onset  . Pancreatic cancer Mother   . Esophageal cancer Maternal Grandmother   . Heart disease Maternal Grandfather   . Heart disease Maternal Uncle   . Colon cancer Maternal Aunt   . Stomach cancer Maternal Aunt   . Arthritis Other   . Cancer Other   . Diabetes Other   . Breast cancer Neg Hx    Social History:  reports that she quit smoking about 50 years ago. Her smoking use included cigarettes. She has a  0.50 pack-year smoking history. She has never used smokeless tobacco. She reports that she does not drink alcohol or use drugs.  Allergies:  Allergies  Allergen Reactions  . Amoxicillin Other (See Comments)    Inflammation in pancreas.  Did it involve swelling of the face/tongue/throat, SOB, or low BP? No Did it involve sudden or severe rash/hives, skin peeling, or any reaction on the inside of your mouth or nose? No Did you need to seek medical attention at a hospital or doctor's office? Yes When did it last happen?5-6 years ago If all above answers are "NO", may proceed with cephalosporin use.     No medications prior to admission.    No results found for this or any previous visit (from the past 48 hour(s)). No results found.  Review of Systems  Constitutional: Negative.   HENT: Negative.   Eyes: Negative.   Respiratory: Negative for cough.   Cardiovascular: Negative for chest pain.  Gastrointestinal: Negative for heartburn.  Genitourinary: Negative for dysuria, frequency and hematuria.  Musculoskeletal:       Neck pain and cervical DDD  Skin: Negative.   Neurological:       Occasional tingling in the right arm to the hand   All other systems reviewed and are negative.   There were no vitals taken for this visit.  Physical Exam  Constitutional: She is oriented to person, place, and time. She appears well-developed and well-nourished. No distress.  HENT:  Head: Normocephalic and atraumatic.  Mouth/Throat: No oropharyngeal exudate.  Eyes: Pupils are equal, round, and reactive to light. Conjunctivae and EOM are normal.  Neck: Neck supple. No tracheal deviation present. No thyromegaly present.  Cardiovascular: Normal rate, regular rhythm and intact distal pulses.  GI: Soft. She exhibits no distension.  Musculoskeletal:     Right shoulder: She exhibits decreased range of motion, tenderness, bony tenderness, crepitus and decreased strength. She exhibits no swelling,  no effusion, no deformity, no spasm and normal pulse.     Left shoulder: Normal.     Right hip: Normal.     Left hip: Normal.     Right knee: Normal.     Left knee: Normal.       Arms:  Lymphadenopathy:    She has no cervical adenopathy.  Neurological: She is alert and oriented to person, place, and time. She has normal reflexes. She displays normal reflexes. She exhibits normal muscle tone.  Skin: Skin is warm and dry. No rash noted. She is not diaphoretic. No erythema. No pallor.  Psychiatric: She has a normal mood and affect. Her behavior is normal. Judgment and thought content normal.     Assessment/Plan  MRI IMPRESSION: 1. Moderate supraspinatus tendinosis with low-grade partial-thickness, articular surface tear of the mid to distal tendon. 2. Moderate infraspinatus tendinosis. Small interstitial tear along the infraspinatus myotendinous junction with incompletely visualized intramuscular ganglion cyst along the scapular body, measuring at least 18 mm. 3. Mild subscapularis tendinosis with articular surface fraying. 4. Mild acromioclavicular osteoarthritis.  PASTA LESION RIGHT ROTATOR CUFF   ROTATOR CUFF REPAIR RIGHT SHOULDER / RIGHT SHOULDER ARTHROSCOPY WITH OPEN ROTATOR CUFF REPAIR     Arther Abbott, MD 01/08/2019, 7:36 PM

## 2019-01-09 ENCOUNTER — Ambulatory Visit (HOSPITAL_COMMUNITY): Payer: No Typology Code available for payment source | Admitting: Anesthesiology

## 2019-01-09 ENCOUNTER — Other Ambulatory Visit: Payer: Self-pay

## 2019-01-09 ENCOUNTER — Ambulatory Visit (HOSPITAL_COMMUNITY)
Admission: RE | Admit: 2019-01-09 | Discharge: 2019-01-09 | Disposition: A | Discharge status: Home / Self Care | Payer: No Typology Code available for payment source | Attending: Orthopedic Surgery | Admitting: Orthopedic Surgery

## 2019-01-09 ENCOUNTER — Other Ambulatory Visit: Payer: Self-pay | Admitting: Orthopedic Surgery

## 2019-01-09 ENCOUNTER — Encounter (HOSPITAL_COMMUNITY)
Admission: RE | Disposition: A | Discharge status: Home / Self Care | Payer: Self-pay | Source: Home / Self Care | Attending: Orthopedic Surgery

## 2019-01-09 ENCOUNTER — Encounter (HOSPITAL_COMMUNITY): Payer: Self-pay | Admitting: *Deleted

## 2019-01-09 DIAGNOSIS — Z90710 Acquired absence of both cervix and uterus: Secondary | ICD-10-CM | POA: Insufficient documentation

## 2019-01-09 DIAGNOSIS — G5600 Carpal tunnel syndrome, unspecified upper limb: Secondary | ICD-10-CM | POA: Diagnosis not present

## 2019-01-09 DIAGNOSIS — E669 Obesity, unspecified: Secondary | ICD-10-CM | POA: Diagnosis not present

## 2019-01-09 DIAGNOSIS — K219 Gastro-esophageal reflux disease without esophagitis: Secondary | ICD-10-CM | POA: Diagnosis not present

## 2019-01-09 DIAGNOSIS — Z87891 Personal history of nicotine dependence: Secondary | ICD-10-CM | POA: Insufficient documentation

## 2019-01-09 DIAGNOSIS — J45909 Unspecified asthma, uncomplicated: Secondary | ICD-10-CM | POA: Insufficient documentation

## 2019-01-09 DIAGNOSIS — Z880 Allergy status to penicillin: Secondary | ICD-10-CM | POA: Diagnosis not present

## 2019-01-09 DIAGNOSIS — W19XXXA Unspecified fall, initial encounter: Secondary | ICD-10-CM | POA: Insufficient documentation

## 2019-01-09 DIAGNOSIS — Z833 Family history of diabetes mellitus: Secondary | ICD-10-CM | POA: Diagnosis not present

## 2019-01-09 DIAGNOSIS — E7800 Pure hypercholesterolemia, unspecified: Secondary | ICD-10-CM | POA: Insufficient documentation

## 2019-01-09 DIAGNOSIS — F329 Major depressive disorder, single episode, unspecified: Secondary | ICD-10-CM | POA: Diagnosis not present

## 2019-01-09 DIAGNOSIS — E785 Hyperlipidemia, unspecified: Secondary | ICD-10-CM | POA: Insufficient documentation

## 2019-01-09 DIAGNOSIS — G709 Myoneural disorder, unspecified: Secondary | ICD-10-CM | POA: Diagnosis not present

## 2019-01-09 DIAGNOSIS — Z8261 Family history of arthritis: Secondary | ICD-10-CM | POA: Insufficient documentation

## 2019-01-09 DIAGNOSIS — Z8249 Family history of ischemic heart disease and other diseases of the circulatory system: Secondary | ICD-10-CM | POA: Diagnosis not present

## 2019-01-09 DIAGNOSIS — M542 Cervicalgia: Secondary | ICD-10-CM

## 2019-01-09 DIAGNOSIS — S43491A Other sprain of right shoulder joint, initial encounter: Secondary | ICD-10-CM | POA: Diagnosis not present

## 2019-01-09 DIAGNOSIS — S46121A Laceration of muscle, fascia and tendon of long head of biceps, right arm, initial encounter: Secondary | ICD-10-CM | POA: Diagnosis not present

## 2019-01-09 DIAGNOSIS — M75111 Incomplete rotator cuff tear or rupture of right shoulder, not specified as traumatic: Secondary | ICD-10-CM | POA: Diagnosis not present

## 2019-01-09 DIAGNOSIS — I10 Essential (primary) hypertension: Secondary | ICD-10-CM | POA: Diagnosis not present

## 2019-01-09 DIAGNOSIS — Z800 Family history of malignant neoplasm of digestive organs: Secondary | ICD-10-CM | POA: Diagnosis not present

## 2019-01-09 DIAGNOSIS — G8929 Other chronic pain: Secondary | ICD-10-CM

## 2019-01-09 DIAGNOSIS — M75101 Unspecified rotator cuff tear or rupture of right shoulder, not specified as traumatic: Secondary | ICD-10-CM | POA: Diagnosis present

## 2019-01-09 DIAGNOSIS — F419 Anxiety disorder, unspecified: Secondary | ICD-10-CM | POA: Insufficient documentation

## 2019-01-09 DIAGNOSIS — R7303 Prediabetes: Secondary | ICD-10-CM | POA: Diagnosis not present

## 2019-01-09 DIAGNOSIS — I471 Supraventricular tachycardia: Secondary | ICD-10-CM | POA: Diagnosis not present

## 2019-01-09 DIAGNOSIS — S46211A Strain of muscle, fascia and tendon of other parts of biceps, right arm, initial encounter: Secondary | ICD-10-CM

## 2019-01-09 DIAGNOSIS — S46211D Strain of muscle, fascia and tendon of other parts of biceps, right arm, subsequent encounter: Secondary | ICD-10-CM | POA: Diagnosis not present

## 2019-01-09 DIAGNOSIS — M25511 Pain in right shoulder: Secondary | ICD-10-CM

## 2019-01-09 HISTORY — PX: SHOULDER ARTHROSCOPY WITH BICEPSTENOTOMY: SHX6204

## 2019-01-09 SURGERY — SHOULDER ARTHROSCOPY WITH BICEPS TENOTOMY
Anesthesia: Regional | Site: Shoulder | Laterality: Right

## 2019-01-09 MED ORDER — CELECOXIB 400 MG PO CAPS
400.0000 mg | ORAL_CAPSULE | Freq: Once | ORAL | Status: AC
  Administered 2019-01-09: 400 mg via ORAL
  Filled 2019-01-09: qty 1

## 2019-01-09 MED ORDER — DEXAMETHASONE SODIUM PHOSPHATE 10 MG/ML IJ SOLN
INTRAMUSCULAR | Status: AC
  Filled 2019-01-09: qty 1

## 2019-01-09 MED ORDER — OXYCODONE HCL 5 MG PO TABS
5.0000 mg | ORAL_TABLET | Freq: Once | ORAL | Status: AC
  Administered 2019-01-09: 5 mg via ORAL
  Filled 2019-01-09: qty 1

## 2019-01-09 MED ORDER — VANCOMYCIN HCL IN DEXTROSE 1-5 GM/200ML-% IV SOLN
1000.0000 mg | INTRAVENOUS | Status: AC
  Administered 2019-01-09: 07:00:00 1000 mg via INTRAVENOUS
  Filled 2019-01-09: qty 200

## 2019-01-09 MED ORDER — ONDANSETRON HCL 4 MG/2ML IJ SOLN
INTRAMUSCULAR | Status: AC
  Filled 2019-01-09: qty 2

## 2019-01-09 MED ORDER — PROMETHAZINE HCL 12.5 MG PO TABS: 12.5000 mg | ORAL_TABLET | Freq: Four times a day (QID) | ORAL | 0 refills | Status: DC | PRN

## 2019-01-09 MED ORDER — MIDAZOLAM HCL 5 MG/5ML IJ SOLN
INTRAMUSCULAR | Status: DC | PRN
  Administered 2019-01-09: 2 mg via INTRAVENOUS

## 2019-01-09 MED ORDER — EPINEPHRINE PF 1 MG/ML IJ SOLN
INTRAMUSCULAR | Status: AC
  Filled 2019-01-09: qty 7

## 2019-01-09 MED ORDER — PROMETHAZINE HCL 25 MG/ML IJ SOLN: 6.2500 mg | INTRAMUSCULAR | Status: DC | PRN

## 2019-01-09 MED ORDER — HYDROCODONE-ACETAMINOPHEN 7.5-325 MG PO TABS: 1.0000 | ORAL_TABLET | Freq: Once | ORAL | Status: DC | PRN

## 2019-01-09 MED ORDER — BUPIVACAINE-EPINEPHRINE (PF) 0.5% -1:200000 IJ SOLN
INTRAMUSCULAR | Status: AC
  Filled 2019-01-09: qty 30

## 2019-01-09 MED ORDER — MIDAZOLAM HCL 2 MG/2ML IJ SOLN: 0.5000 mg | Freq: Once | INTRAMUSCULAR | Status: DC | PRN

## 2019-01-09 MED ORDER — ROCURONIUM BROMIDE 10 MG/ML (PF) SYRINGE
PREFILLED_SYRINGE | INTRAVENOUS | Status: AC
  Filled 2019-01-09: qty 10

## 2019-01-09 MED ORDER — PROPOFOL 10 MG/ML IV BOLUS
INTRAVENOUS | Status: DC | PRN
  Administered 2019-01-09: 170 mg via INTRAVENOUS
  Administered 2019-01-09: 30 mg via INTRAVENOUS

## 2019-01-09 MED ORDER — MIDAZOLAM HCL 2 MG/2ML IJ SOLN
INTRAMUSCULAR | Status: AC
  Filled 2019-01-09: qty 2

## 2019-01-09 MED ORDER — OXYCODONE-ACETAMINOPHEN 5-325 MG PO TABS: 1.0000 | ORAL_TABLET | ORAL | 0 refills | Status: AC | PRN

## 2019-01-09 MED ORDER — SUCCINYLCHOLINE CHLORIDE 20 MG/ML IJ SOLN
INTRAMUSCULAR | Status: DC | PRN
  Administered 2019-01-09: 50 mg via INTRAVENOUS
  Administered 2019-01-09: 150 mg via INTRAVENOUS

## 2019-01-09 MED ORDER — CHLORHEXIDINE GLUCONATE 4 % EX LIQD: 60.0000 mL | Freq: Once | CUTANEOUS | Status: DC

## 2019-01-09 MED ORDER — ONDANSETRON HCL 4 MG/2ML IJ SOLN
INTRAMUSCULAR | Status: DC | PRN
  Administered 2019-01-09: 4 mg via INTRAVENOUS

## 2019-01-09 MED ORDER — IBUPROFEN 800 MG PO TABS: 800.0000 mg | ORAL_TABLET | Freq: Three times a day (TID) | ORAL | 1 refills | Status: DC

## 2019-01-09 MED ORDER — EPHEDRINE SULFATE 50 MG/ML IJ SOLN
INTRAMUSCULAR | Status: DC | PRN
  Administered 2019-01-09: 10 mg via INTRAVENOUS

## 2019-01-09 MED ORDER — PREGABALIN 50 MG PO CAPS
50.0000 mg | ORAL_CAPSULE | Freq: Once | ORAL | Status: AC
  Administered 2019-01-09: 11:00:00 50 mg via ORAL
  Filled 2019-01-09: qty 1

## 2019-01-09 MED ORDER — FENTANYL CITRATE (PF) 100 MCG/2ML IJ SOLN
INTRAMUSCULAR | Status: DC | PRN
  Administered 2019-01-09: 100 ug via INTRAVENOUS

## 2019-01-09 MED ORDER — ONDANSETRON HCL 4 MG/2ML IJ SOLN
4.0000 mg | Freq: Once | INTRAMUSCULAR | Status: AC
  Administered 2019-01-09: 10:00:00 4 mg via INTRAVENOUS
  Filled 2019-01-09: qty 2

## 2019-01-09 MED ORDER — SODIUM CHLORIDE 0.9 % IR SOLN
Status: DC | PRN
  Administered 2019-01-09 (×5): 3000 mL

## 2019-01-09 MED ORDER — ROCURONIUM BROMIDE 100 MG/10ML IV SOLN
INTRAVENOUS | Status: DC | PRN
  Administered 2019-01-09: 30 mg via INTRAVENOUS

## 2019-01-09 MED ORDER — HYDROMORPHONE HCL 1 MG/ML IJ SOLN: 0.2500 mg | INTRAMUSCULAR | Status: DC | PRN

## 2019-01-09 MED ORDER — LACTATED RINGERS IV SOLN
INTRAVENOUS | Status: DC | PRN
  Administered 2019-01-09: 07:00:00 via INTRAVENOUS

## 2019-01-09 MED ORDER — SUGAMMADEX SODIUM 200 MG/2ML IV SOLN
INTRAVENOUS | Status: DC | PRN
  Administered 2019-01-09: 200 mg via INTRAVENOUS

## 2019-01-09 MED ORDER — SODIUM CHLORIDE 0.9 % IR SOLN
Status: DC | PRN
  Administered 2019-01-09: 1000 mL

## 2019-01-09 MED ORDER — PROPOFOL 10 MG/ML IV BOLUS
INTRAVENOUS | Status: AC
  Filled 2019-01-09: qty 40

## 2019-01-09 MED ORDER — BUPIVACAINE-EPINEPHRINE 0.5% -1:200000 IJ SOLN
INTRAMUSCULAR | Status: DC | PRN
  Administered 2019-01-09: 60 mL

## 2019-01-09 MED ORDER — ROPIVACAINE HCL 5 MG/ML IJ SOLN
INTRAMUSCULAR | Status: AC
  Filled 2019-01-09: qty 30

## 2019-01-09 MED ORDER — FENTANYL CITRATE (PF) 250 MCG/5ML IJ SOLN
INTRAMUSCULAR | Status: AC
  Filled 2019-01-09: qty 5

## 2019-01-09 MED ORDER — LACTATED RINGERS IV SOLN: INTRAVENOUS | Status: DC

## 2019-01-09 MED ORDER — METHOCARBAMOL 1000 MG/10ML IJ SOLN
500.0000 mg | Freq: Once | INTRAVENOUS | Status: AC
  Administered 2019-01-09: 500 mg via INTRAVENOUS
  Filled 2019-01-09: qty 5

## 2019-01-09 MED ORDER — DEXAMETHASONE SODIUM PHOSPHATE 10 MG/ML IJ SOLN
INTRAMUSCULAR | Status: DC | PRN
  Administered 2019-01-09: 8 mg via INTRAVENOUS

## 2019-01-09 SURGICAL SUPPLY — 61 items
BLADE 10 SAFETY STRL DISP (BLADE) ×6 IMPLANT
BLADE 11 SAFETY STRL DISP (BLADE) ×3 IMPLANT
BLADE 15 SAFETY STRL DISP (BLADE) ×3 IMPLANT
BLADE AGGRESSIVE PLUS 4.0 (BLADE) ×3 IMPLANT
BLADE AVERAGE 25X9 (BLADE) IMPLANT
BLADE HEX COATED 2.75 (ELECTRODE) ×3 IMPLANT
BNDG COHESIVE 4X5 TAN STRL (GAUZE/BANDAGES/DRESSINGS) ×3 IMPLANT
CANNULA DRILOCK 5.0X75 (CANNULA) ×3 IMPLANT
CHLORAPREP W/TINT 26 (MISCELLANEOUS) ×3 IMPLANT
CLOTH BEACON ORANGE TIMEOUT ST (SAFETY) ×3 IMPLANT
COVER LIGHT HANDLE STERIS (MISCELLANEOUS) ×10 IMPLANT
COVER PROBE W GEL 5X96 (DRAPES) ×3 IMPLANT
DECANTER SPIKE VIAL GLASS SM (MISCELLANEOUS) ×6 IMPLANT
DRAPE PROXIMA HALF (DRAPES) ×3 IMPLANT
DRAPE SHOULDER BEACH CHAIR (DRAPES) ×3 IMPLANT
DRAPE U-SHAPE 47X51 STRL (DRAPES) ×3 IMPLANT
DRESSING ALLEVYN BORDER 5X5 (GAUZE/BANDAGES/DRESSINGS) ×2 IMPLANT
ELECT REM PT RETURN 9FT ADLT (ELECTROSURGICAL) ×3
ELECTRODE REM PT RTRN 9FT ADLT (ELECTROSURGICAL) ×2 IMPLANT
GAUZE 4X4 16PLY RFD (DISPOSABLE) ×3 IMPLANT
GLOVE BIO SURGEON STRL SZ7 (GLOVE) ×2 IMPLANT
GLOVE BIOGEL PI IND STRL 7.0 (GLOVE) ×2 IMPLANT
GLOVE BIOGEL PI INDICATOR 7.0 (GLOVE) ×3
GLOVE SS N UNI LF 8.5 STRL (GLOVE) ×3 IMPLANT
GLOVE SURG SS PI 8.0 STRL IVOR (GLOVE) ×1 IMPLANT
GOWN STRL REUS W/TWL LRG LVL3 (GOWN DISPOSABLE) ×10 IMPLANT
GOWN STRL REUS W/TWL XL LVL3 (GOWN DISPOSABLE) ×3 IMPLANT
IMMOBILIZER SHOULDER LGE (ORTHOPEDIC SUPPLIES) ×1 IMPLANT
INST SET MINOR BONE (KITS) ×3 IMPLANT
IV NS IRRIG 3000ML ARTHROMATIC (IV SOLUTION) ×9 IMPLANT
KIT BLADEGUARD II DBL (SET/KITS/TRAYS/PACK) ×3 IMPLANT
KIT POSITION SHOULDER SCHLEI (MISCELLANEOUS) ×3 IMPLANT
KIT TURNOVER KIT A (KITS) ×3 IMPLANT
MANIFOLD NEPTUNE II (INSTRUMENTS) ×3 IMPLANT
MARKER SKIN DUAL TIP RULER LAB (MISCELLANEOUS) ×3 IMPLANT
NDL HYPO 21X1.5 SAFETY (NEEDLE) ×2 IMPLANT
NDL SPNL 18GX3.5 QUINCKE PK (NEEDLE) ×2 IMPLANT
NEEDLE HYPO 21X1.5 SAFETY (NEEDLE) ×3 IMPLANT
NEEDLE SPNL 18GX3.5 QUINCKE PK (NEEDLE) ×3 IMPLANT
NS IRRIG 1000ML POUR BTL (IV SOLUTION) ×3 IMPLANT
PACK BASIC III (CUSTOM PROCEDURE TRAY) ×1
PACK SRG BSC III STRL LF ECLPS (CUSTOM PROCEDURE TRAY) ×2 IMPLANT
PACK TOTAL JOINT (CUSTOM PROCEDURE TRAY) ×1 IMPLANT
PAD ARMBOARD 7.5X6 YLW CONV (MISCELLANEOUS) ×3 IMPLANT
PENCIL HANDSWITCHING (ELECTRODE) ×3 IMPLANT
SET ARTHROSCOPY INST (INSTRUMENTS) ×3 IMPLANT
SET BASIN LINEN APH (SET/KITS/TRAYS/PACK) ×3 IMPLANT
SET TUBE SUCT SHAVER OUTFL 24K (TUBING) ×6 IMPLANT
STOCKINETTE IMPERVIOUS LG (DRAPES) ×3 IMPLANT
STRIP CLOSURE SKIN 1/2X4 (GAUZE/BANDAGES/DRESSINGS) IMPLANT
SUT MON AB 0 CT1 (SUTURE) ×3 IMPLANT
SUT MON AB 2-0 CT1 36 (SUTURE) ×2 IMPLANT
SUT PROLENE 2 0 SH 30 (SUTURE) ×1 IMPLANT
SUT VIC AB 1 CT1 27 (SUTURE) ×1
SUT VIC AB 1 CT1 27XBRD ANTBC (SUTURE) IMPLANT
SYR 30ML LL (SYRINGE) ×3 IMPLANT
SYR BULB IRRIGATION 50ML (SYRINGE) ×6 IMPLANT
TOWEL OR 17X26 4PK STRL BLUE (TOWEL DISPOSABLE) ×3 IMPLANT
WAND 90 DEG TURBOVAC W/CORD (SURGICAL WAND) ×3 IMPLANT
YANKAUER SUCT 12FT TUBE ARGYLE (SUCTIONS) ×3 IMPLANT
YANKAUER SUCT BULB TIP 10FT TU (MISCELLANEOUS) ×6 IMPLANT

## 2019-01-09 NOTE — Anesthesia Procedure Notes (Signed)
Procedure Name: Intubation Date/Time: 01/09/2019 8:02 AM Performed by: Charmaine Downs, CRNA Pre-anesthesia Checklist: Patient identified, Emergency Drugs available, Suction available and Patient being monitored Patient Re-evaluated:Patient Re-evaluated prior to induction Oxygen Delivery Method: Circle system utilized Preoxygenation: Pre-oxygenation with 100% oxygen Induction Type: IV induction, Rapid sequence and Cricoid Pressure applied Laryngoscope Size: Mac and 4 Grade View: Grade II Tube size: 7.0 mm Number of attempts: 2 Airway Equipment and Method: Stylet Placement Confirmation: ETT inserted through vocal cords under direct vision,  positive ETCO2 and breath sounds checked- equal and bilateral Secured at: 23 cm Tube secured with: Tape Dental Injury: Bloody posterior oropharynx  Difficulty Due To: Difficulty was anticipated, Difficult Airway- due to anterior larynx and Difficult Airway- due to reduced neck mobility Future Recommendations: Recommend- induction with short-acting agent, and alternative techniques readily available Comments: Difficult to visualize with patient on Sclein head holder.Small mouth and slightly anterior larynx.Successful on 2nd attempt with small amount posterior oropharynx.

## 2019-01-09 NOTE — Progress Notes (Signed)
Axillary block discussed with patient, verbalizes that she would like to proceed. Time out 0734. Aseptic technique maintained throughout procedure. Procedure time start 0109 end time 3235. Patient tolerated procedure well with desired block achieved.

## 2019-01-09 NOTE — Anesthesia Postprocedure Evaluation (Signed)
Anesthesia Post Note  Patient: Kathryn Gallegos  Procedure(s) Performed: SHOULDER ARTHROSCOPY WITH biceps tenotomy, limited debriedment (Right Shoulder)  Patient location during evaluation: PACU Anesthesia Type: Regional and General Level of consciousness: awake and patient cooperative Pain management: pain level controlled Vital Signs Assessment: post-procedure vital signs reviewed and stable Respiratory status: spontaneous breathing, nonlabored ventilation and respiratory function stable Cardiovascular status: blood pressure returned to baseline Postop Assessment: no apparent nausea or vomiting Anesthetic complications: no     Last Vitals:  Vitals:   01/09/19 1015 01/09/19 1030  BP: 124/82 116/80  Pulse: 84 83  Resp: 13 14  Temp:    SpO2: 96% 94%    Last Pain:  Vitals:   01/09/19 1015  TempSrc:   PainSc: 0-No pain                 Sendy Pluta J

## 2019-01-09 NOTE — Anesthesia Procedure Notes (Signed)
Anesthesia Regional Block: Interscalene brachial plexus block   Pre-Anesthetic Checklist: ,, timeout performed, Correct Patient, Correct Site, Correct Laterality, Correct Procedure, Correct Position, site marked, Risks and benefits discussed, at surgeon's request and post-op pain management  Laterality: Upper and Right  Prep: chloraprep, alcohol swabs       Needles:  Injection technique: Single-shot  Needle Type: Stimulator Needle - 40     Needle Length: 2cm  Needle Gauge: 22     Additional Needles:   Procedures:,,,, ultrasound used (permanent image in chart),,,,  (unable to print [employer])   Nerve Stimulator or Paresthesia:  Response: Twitch elicited, 0.5 mA, 0.3 ms,   Additional Responses:   Narrative:  Start time: 01/09/2019 7:36 AM End time: 01/09/2019 7:43 AM Injection made incrementally with aspirations every 4 mL.  Performed by: With CRNAs  Anesthesiologist: Lenice Llamas, MD  Additional Notes: Block assessed prior to start of surgery Total of 30cc 0.5 % Naropin plain in 3-5 cc increments. One slight parasthesia which resolved immediately. Pt awake and conversant throughout block  Total of 2mg  Versed, 100 ucg fent given for sedation  To OR with Team after block

## 2019-01-09 NOTE — Brief Op Note (Signed)
01/09/2019  9:56 AM  PATIENT:  Kathryn Gallegos  63 y.o. female  PRE-OPERATIVE DIAGNOSIS:  torn rotator cuff right shoulder  POST-OPERATIVE DIAGNOSIS:  partial rotator cuff tear right shoulder, partial right biceps tendon tear  PROCEDURE:  Procedure(s): SHOULDER ARTHROSCOPY WITH biceps tenotomy, limited debriedment (Right)   FINDINGS: INTRA AND EXTRA ARTICULAR TEAR RIGHT BICEPS TENDON, PARTIAL TEAR  ARTICULAR SURFACE SUPRASPINATUS  RIGHT SHOULDER   SURGEON:  Surgeon(s) and Role:    * [name], MD - Primary  PHYSICIAN ASSISTANT:   ASSISTANTS:BETTY ASHLEY   ANESTHESIA:   general and paracervical block  EBL:  10 mL   BLOOD ADMINISTERED:none  DRAINS: none   LOCAL MEDICATIONS USED:  MARCAINE 0.5% WITH EPI  Amount: 60 ml  SPECIMEN:  No Specimen  DISPOSITION OF SPECIMEN:  N/A  COUNTS:  YES  TOURNIQUET:  * No tourniquets in log *  DICTATION: .Dragon Dictation  PLAN OF CARE: Discharge to home after PACU  PATIENT DISPOSITION:  PACU - hemodynamically stable.   Delay start of Pharmacological VTE agent (>24hrs) due to surgical blood loss or risk of bleeding: not applicable

## 2019-01-09 NOTE — Op Note (Addendum)
01/09/2019  9:56 AM  PATIENT:  Kathryn Gallegos  63 y.o. female  PRE-OPERATIVE DIAGNOSIS:  torn rotator cuff right shoulder  POST-OPERATIVE DIAGNOSIS:  partial rotator cuff tear right shoulder, partial right biceps tendon tear  PROCEDURE:  Procedure(s): SHOULDER ARTHROSCOPY WITH biceps tenotomy, EXTENSIVE debriedment (Right) -89381  FINDINGS: INTRA AND EXTRA ARTICULAR TEAR RIGHT BICEPS TENDON, PARTIAL TEAR  ARTICULAR SURFACE SUPRASPINATUS  RIGHT SHOULDER  Posterior labral tear.  SURGEON:  Surgeon(s) and Role:    * [name], MD - Primary  DETAILS:   The patient was seen in preop.  The chart was reviewed.  The surgical site was confirmed and marked right shoulder.   Paracervical block was performed by anesthesia .   -the patient was taken to the operating room where she was given 1 g of vancomycin secondary to allergies to cephalosporins and penicillins.  She was intubated And placed in the modified beachchair position with appropriate padding.  The shoulder and right upper extremity were prepped and draped sterilely followed by timeout.  Site was confirmed.  A posterior portal was placed and the scope was introduced into the glenohumeral joint where diagnostic arthroscopy was performed. The biceps tendon was frayed irregular at its intra-articular portion.  When the tendon was pulled into the joint from the bicipital groove there was a split tear involving greater than 50% of the tendon.  There was a posterior labral tear.  The glenohumeral joint was normal.  Rotator cuff was evaluated with the arm in internal/external rotation and abduction internal and external rotation.  The area just anterior to the biceps tendon was marked with a suture although no separation of the tendon was noted at this point there was some undersurface tearing which was approximately 10%.  Posterior cuff was otherwise normal  Subacromial space: Mild bursitis no evidence of rotator cuff bursal surface  tearing.  An anterior portal was established and shaver was introduced into the joint through the anterior portal where the biceps tendon was debrided.  After thorough evaluation of the rotator cuff as described the scope was placed in the subacromial space where bursectomy was performed.  An anterolateral portal was placed.  The suture marking the undersurface tearing of the rotator cuff was used to evaluate the supraspinatus tendon.  There was no evidence of bursal side tearing and a bursectomy was performed  The scope was placed back into the joint through a separate posterior lateral portal; The biceps tendon was reevaluated further debridement was performed and tenotomy was performed up to the bicipital groove. The undersurface tear of the rotator cuff was debrided.  The joint and the subacromial space were irrigated and the portal sites were closed with 3-0 nylon suture x4.  Sterile dressing was applied  The patient was extubated taken to recovery room in stable condition   PHYSICIAN ASSISTANT:   ASSISTANTS:BETTY ASHLEY   ANESTHESIA:   general and paracervical block  EBL:  10 mL   BLOOD ADMINISTERED:none  DRAINS: none   LOCAL MEDICATIONS USED:  MARCAINE 0.5% WITH EPI  Amount: 60 ml  SPECIMEN:  No Specimen  DISPOSITION OF SPECIMEN:  N/A  COUNTS:  YES  TOURNIQUET:  * No tourniquets in log *  DICTATION: .Dragon Dictation  PLAN OF CARE: Discharge to home after PACU  PATIENT DISPOSITION:  PACU - hemodynamically stable.   Delay start of Pharmacological VTE agent (>24hrs) due to surgical blood loss or risk of bleeding: not applicable

## 2019-01-09 NOTE — Interval H&P Note (Signed)
History and Physical Interval Note:  01/09/2019 7:25 AM  Kathryn Gallegos  has presented today for surgery, with the diagnosis of torn rotator cuff right shoulder.  The various methods of treatment have been discussed with the patient and family. After consideration of risks, benefits and other options for treatment, the patient has consented to  Procedure(s): SHOULDER ARTHROSCOPY WITH OPEN ROTATOR CUFF REPAIR (Right) as a surgical intervention.  The patient's history has been reviewed, patient examined, no change in status, stable for surgery.  I have reviewed the patient's chart and labs.  Questions were answered to the patient's satisfaction.     Arther Abbott

## 2019-01-09 NOTE — Discharge Instructions (Signed)
Good news! The rotator cuff tear was so small it only needed debridement ( removal of the torn tissue). The main problem was a biceps tendon tear which required TENOTOMY (release and debridement)

## 2019-01-09 NOTE — Anesthesia Preprocedure Evaluation (Signed)
Anesthesia Evaluation  Patient identified by MRN, date of birth, ID band Patient awake    Reviewed: Allergy & Precautions, NPO status , Patient's Chart, lab work & pertinent test results  Airway Mallampati: III  TM Distance: >3 FB Neck ROM: Full    Dental no notable dental hx.    Pulmonary asthma , former smoker,    Pulmonary exam normal breath sounds clear to auscultation       Cardiovascular Exercise Tolerance: Good hypertension, Pt. on medications negative cardio ROS Normal cardiovascular examI Rhythm:Regular Rate:Normal  H/o SVT on [place] -last palp sx ~2017  Good ET Denies CP/DOE   Neuro/Psych Anxiety Depression  Neuromuscular disease negative psych ROS   GI/Hepatic Neg liver ROS, GERD  Medicated and Controlled,  Endo/Other  negative endocrine ROS  Renal/GU negative Renal ROS  negative genitourinary   Musculoskeletal negative musculoskeletal ROS (+)   Abdominal   Peds negative pediatric ROS (+)  Hematology negative hematology ROS (+)   Anesthesia Other Findings   Reproductive/Obstetrics negative OB ROS                             Anesthesia Physical Anesthesia Plan  ASA: II  Anesthesia Plan: General and Regional   Post-op Pain Management:    Induction: Intravenous  PONV Risk Score and Plan:   Airway Management Planned: Oral ETT  Additional Equipment:   Intra-op Plan:   Post-operative Plan: Extubation in OR  Informed Consent: I have reviewed the patients History and Physical, chart, labs and discussed the procedure including the risks, benefits and alternatives for the proposed anesthesia with the patient or authorized representative who has indicated his/her understanding and acceptance.     Dental advisory given  Plan Discussed with: CRNA  Anesthesia Plan Comments: (Plan Full PPE use  Plan GETA  D/w Pt preop R ISB for POPM -PSR -WTP same after Q&A)         Anesthesia Quick Evaluation

## 2019-01-09 NOTE — Transfer of Care (Signed)
Immediate Anesthesia Transfer of Care Note  Patient: Kathryn Gallegos  Procedure(s) Performed: SHOULDER ARTHROSCOPY WITH biceps tenotomy, limited debriedment (Right Shoulder)  Patient Location: PACU  Anesthesia Type:General  Level of Consciousness: drowsy  Airway & Oxygen Therapy: Patient Spontanous Breathing and Patient connected to face mask oxygen  Post-op Assessment: Report given to RN and Post -op Vital signs reviewed and stable  Post vital signs: Reviewed and stable  Last Vitals:  Vitals Value Taken Time  BP    Temp    Pulse 91 01/09/2019  9:56 AM  Resp    SpO2 80 % 01/09/2019  9:56 AM  Vitals shown include unvalidated device data.  Last Pain:  Vitals:   01/09/19 0714  TempSrc: Oral  PainSc: 5       Patients Stated Pain Goal: 8 (21/79/81 0254)  Complications: No apparent anesthesia complications

## 2019-01-10 ENCOUNTER — Encounter (HOSPITAL_COMMUNITY): Payer: Self-pay | Admitting: Orthopedic Surgery

## 2019-01-11 DIAGNOSIS — Z9889 Other specified postprocedural states: Secondary | ICD-10-CM | POA: Insufficient documentation

## 2019-01-15 ENCOUNTER — Ambulatory Visit (INDEPENDENT_AMBULATORY_CARE_PROVIDER_SITE_OTHER): Payer: No Typology Code available for payment source | Admitting: Orthopedic Surgery

## 2019-01-15 ENCOUNTER — Encounter: Payer: Self-pay | Admitting: Orthopedic Surgery

## 2019-01-15 ENCOUNTER — Other Ambulatory Visit: Payer: Self-pay

## 2019-01-15 DIAGNOSIS — Z9889 Other specified postprocedural states: Secondary | ICD-10-CM

## 2019-01-15 NOTE — Progress Notes (Signed)
Chief Complaint  Patient presents with  . Shoulder Pain    Rt shoulder DOS 01/09/19   PATIENT:  Kathryn Gallegos  63 y.o. female   PRE-OPERATIVE DIAGNOSIS:  torn rotator cuff right shoulder   POST-OPERATIVE DIAGNOSIS:  partial rotator cuff tear right shoulder, partial right biceps tendon tear   PROCEDURE:  Procedure(s): SHOULDER ARTHROSCOPY WITH biceps tenotomy, EXTENSIVE debriedment (Right) -[id number]   FINDINGS: INTRA AND EXTRA ARTICULAR TEAR RIGHT BICEPS TENDON, PARTIAL TEAR  ARTICULAR SURFACE SUPRASPINATUS  RIGHT SHOULDER  Posterior labral tear.   SURGEON:  Surgeon(s) and Role:    * [name], MD - Primary  SUTURES WERE REMOVED . THE INCISIONS LOOK GREAT   Start  PT   Wear the sling x 3 weeks full time and them as needed  Encounter Diagnosis  Name Primary?  . S/P arthroscopy of right shoulder 01/09/19     Start therapy at [facility]  Wear sling 3 weeks ok to remove when you are you sitting around the house  Remove it when you are eating   [place] 5/26 - 8/26

## 2019-01-15 NOTE — Patient Instructions (Addendum)
Start therapy at [facility]  Wear sling 3 weeks ok to remove when you are you sitting around the house  Remove it when you are eating   [place] 5/26 - 8/26

## 2019-02-05 ENCOUNTER — Other Ambulatory Visit: Payer: Self-pay

## 2019-02-05 ENCOUNTER — Ambulatory Visit (INDEPENDENT_AMBULATORY_CARE_PROVIDER_SITE_OTHER): Payer: No Typology Code available for payment source | Admitting: Orthopedic Surgery

## 2019-02-05 ENCOUNTER — Encounter: Payer: Self-pay | Admitting: Orthopedic Surgery

## 2019-02-05 VITALS — Temp 97.3°F

## 2019-02-05 DIAGNOSIS — Z9889 Other specified postprocedural states: Secondary | ICD-10-CM

## 2019-02-05 NOTE — Progress Notes (Signed)
  Chief Complaint  Patient presents with  . Routine Post Op    01/09/19 right shoulder RCR  has pain at night wants to d/c sling   POD # 27  PATIENT:  Kathryn Gallegos  63 y.o. female   PRE-OPERATIVE DIAGNOSIS:  torn rotator cuff right shoulder   POST-OPERATIVE DIAGNOSIS:  partial rotator cuff tear right shoulder, partial right biceps tendon tear   PROCEDURE:  Procedure(s): SHOULDER ARTHROSCOPY WITH biceps tenotomy, EXTENSIVE debriedment (Right) -[id number]   FINDINGS: INTRA AND EXTRA ARTICULAR TEAR RIGHT BICEPS TENDON, PARTIAL TEAR  ARTICULAR SURFACE SUPRASPINATUS  RIGHT SHOULDER  Posterior labral tear.    Encounter Diagnosis  Name Primary?  . S/P arthroscopy of right shoulder 01/09/19 Yes    PLAN:  SLING PRN PT 2 X A WEEK  NO WORK YET  4 WKS FU

## 2019-02-07 IMAGING — DX DG KNEE COMPLETE 4+V*L*
4 series · 4 of 4 positions shown · non-contrast
Comparison: None.

CLINICAL DATA: 61-year-old female with fall and left knee pain.

EXAM:
LEFT KNEE - COMPLETE 4+ VIEW

[knee ap]
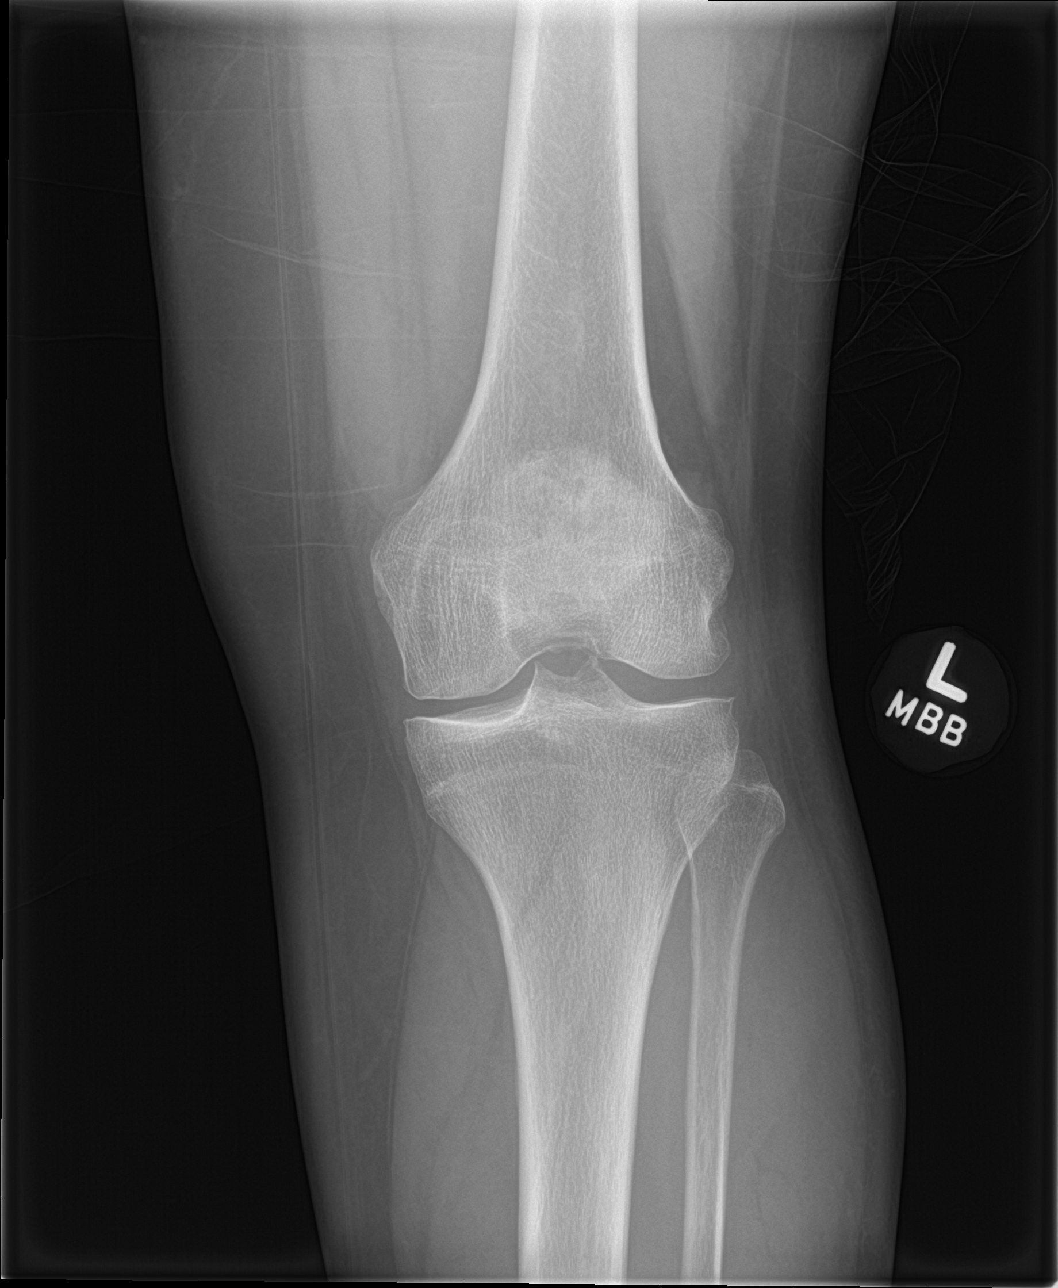

[knee lat]
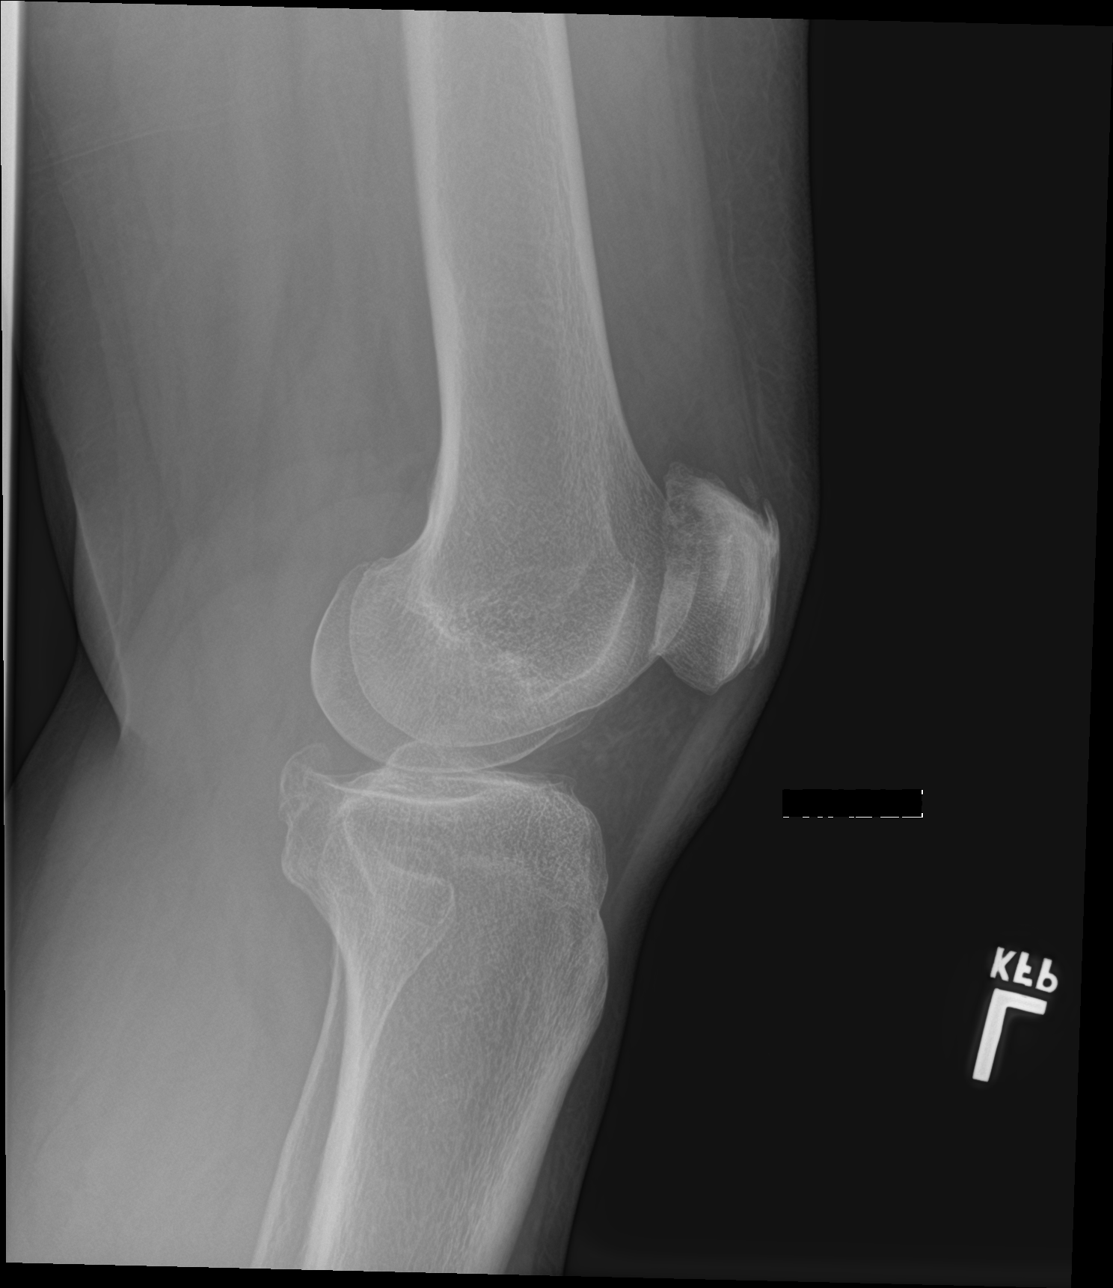

[knee obl (1 of 2)]
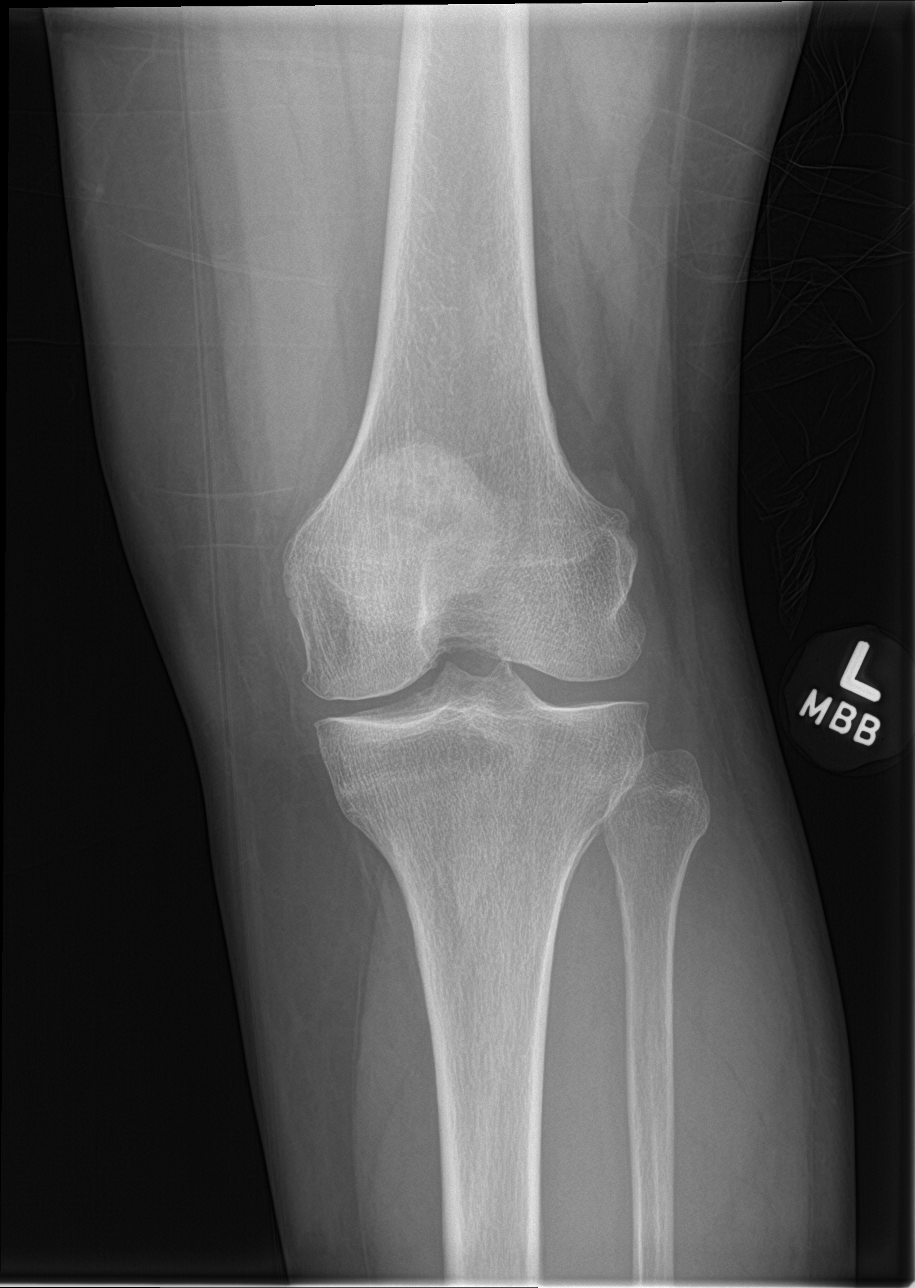

[knee obl (2 of 2)]
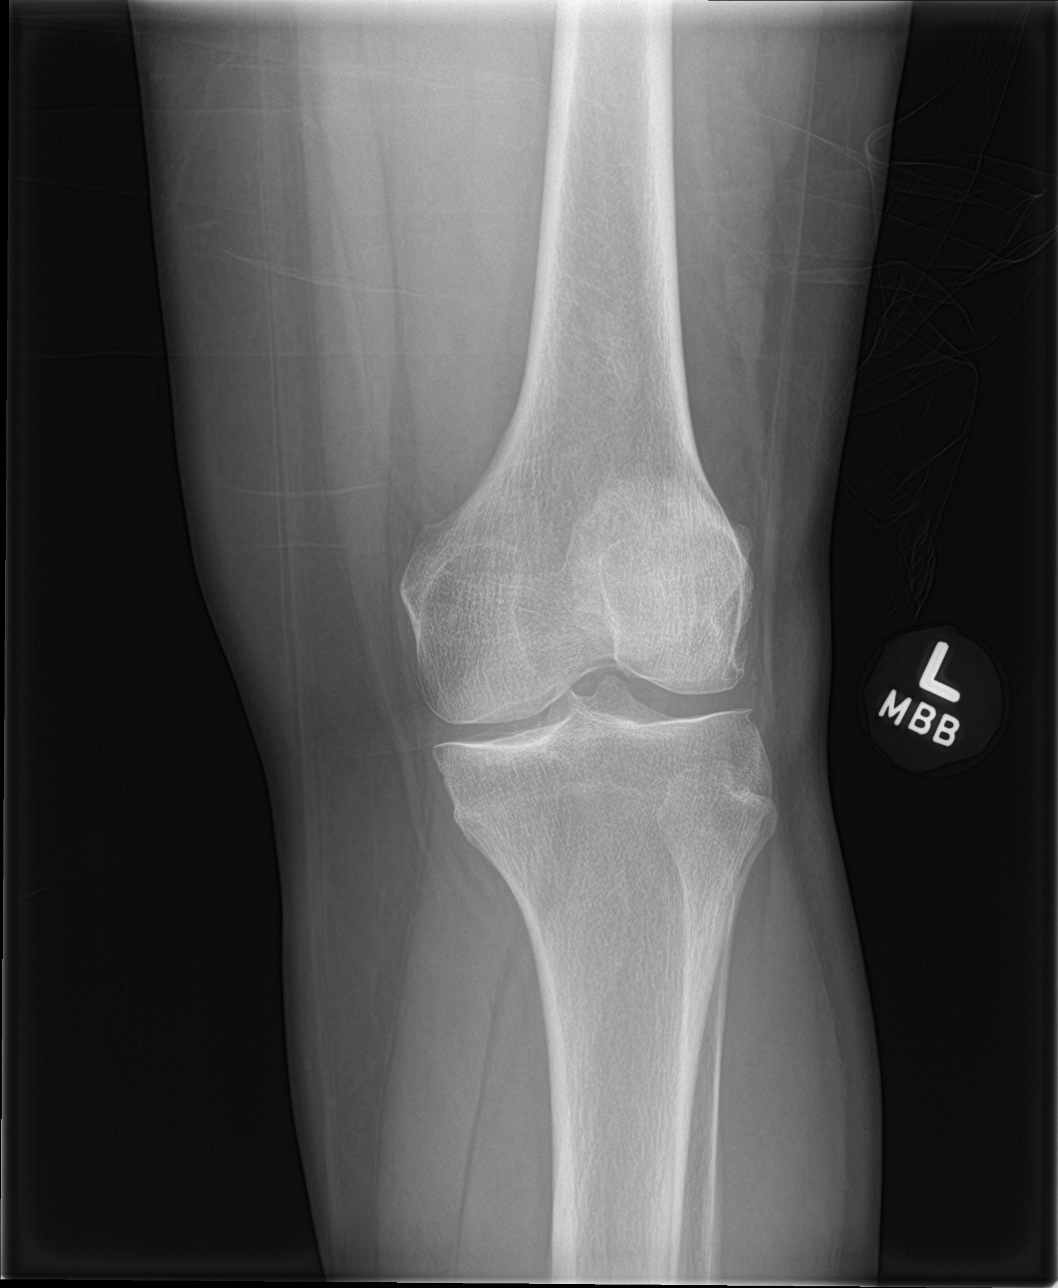

[4 of 4 positions shown; findings below may reference images not displayed]

FINDINGS: There is no acute fracture or dislocation. There is mild
osteoarthritic changes. There is irregularity of the posterior
patella, likely chondromalacia patella. Trace suprapatellar effusion
may be present. The soft tissues appear unremarkable.
IMPRESSION: No acute fracture or dislocation.

Degenerative changes.

## 2019-02-07 IMAGING — DX DG SHOULDER 2+V*L*
3 series · 3 of 3 positions shown · non-contrast
Comparison: None.

CLINICAL DATA: 61-year-old female with fall and left shoulder pain.

EXAM:
LEFT SHOULDER - 2+ VIEW

[shoulder grashey]
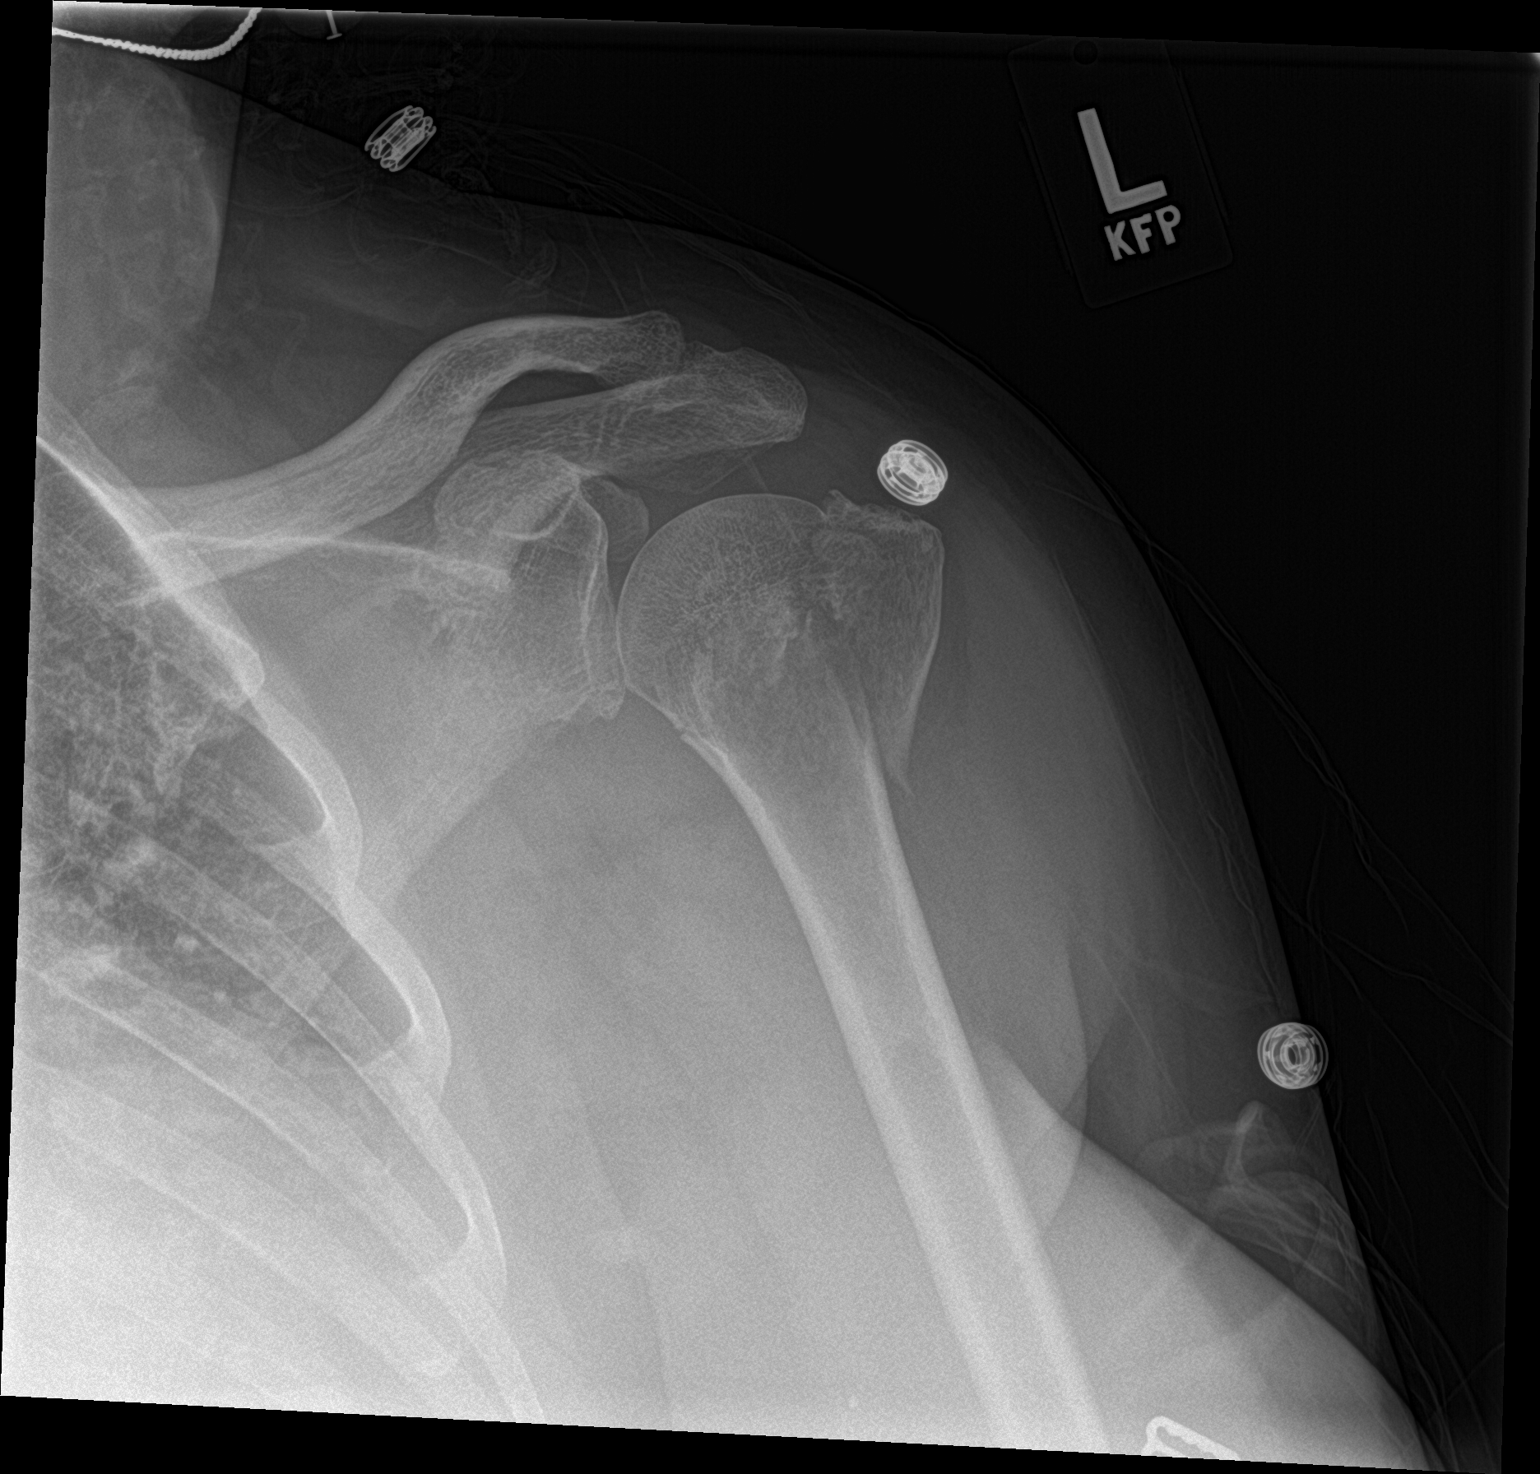

[shoulder y view]
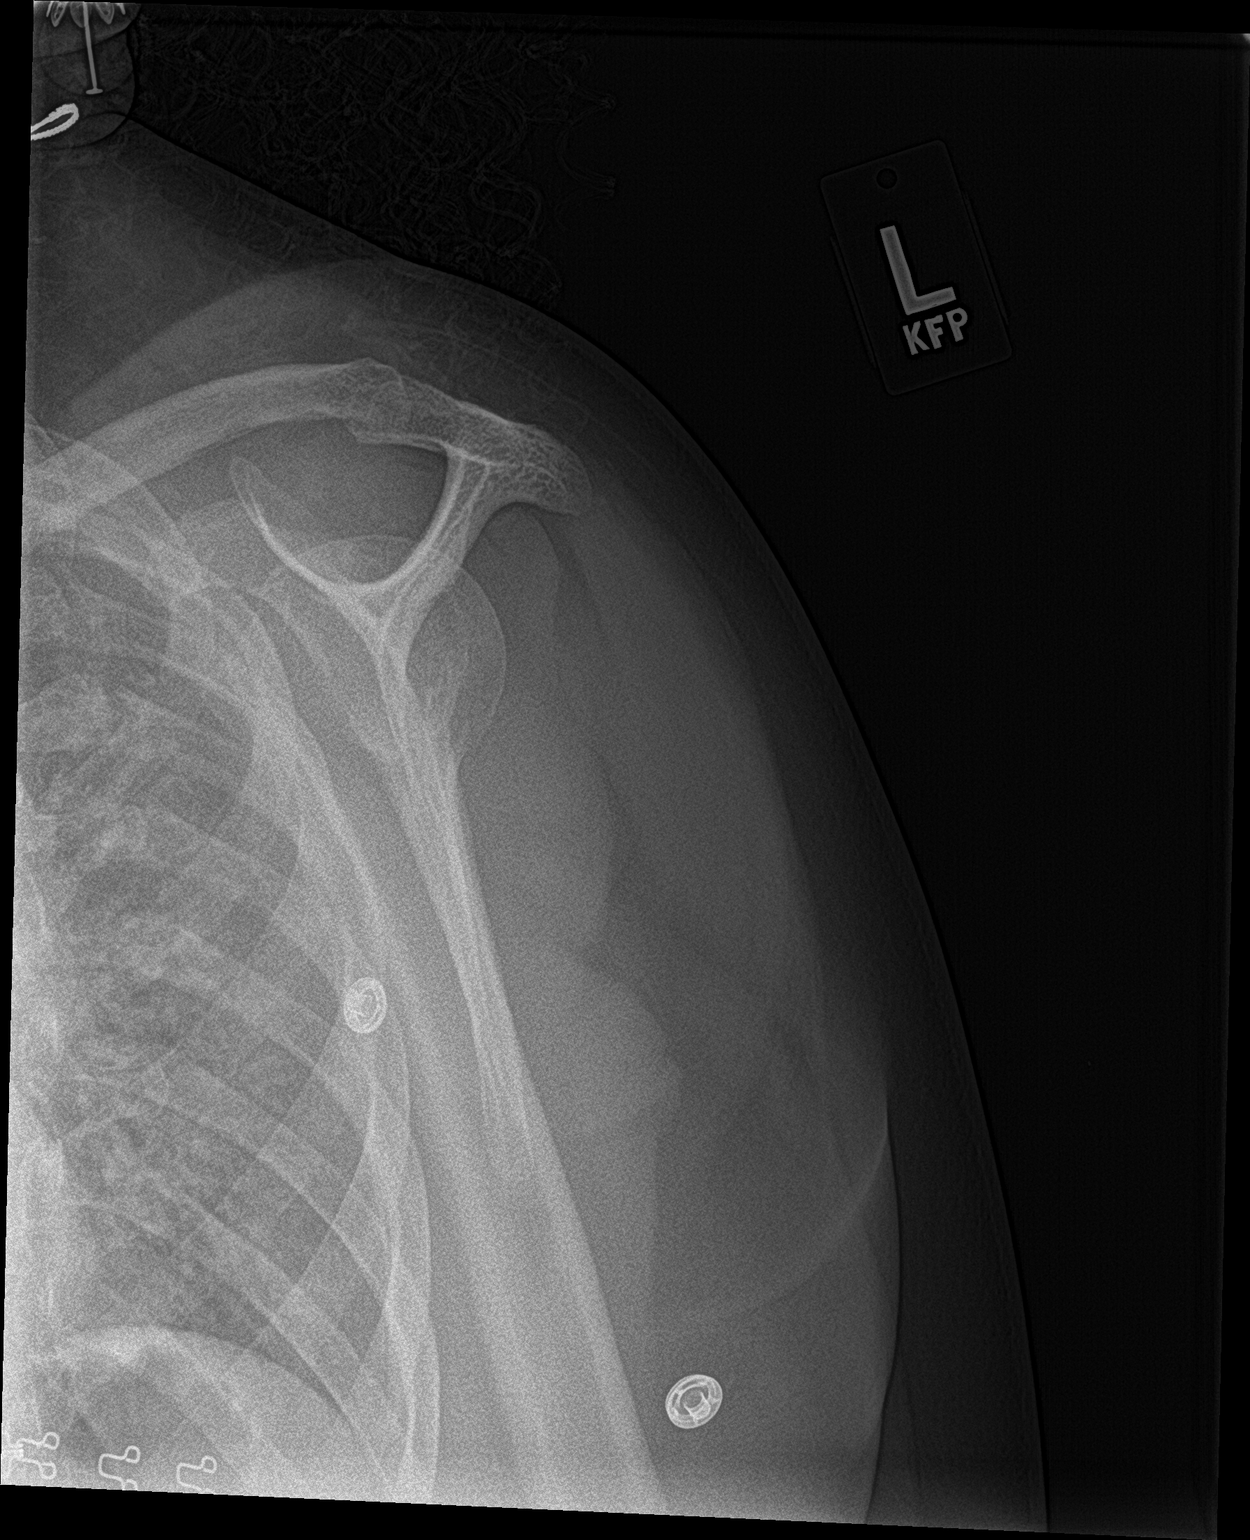

[shoulder axillary]
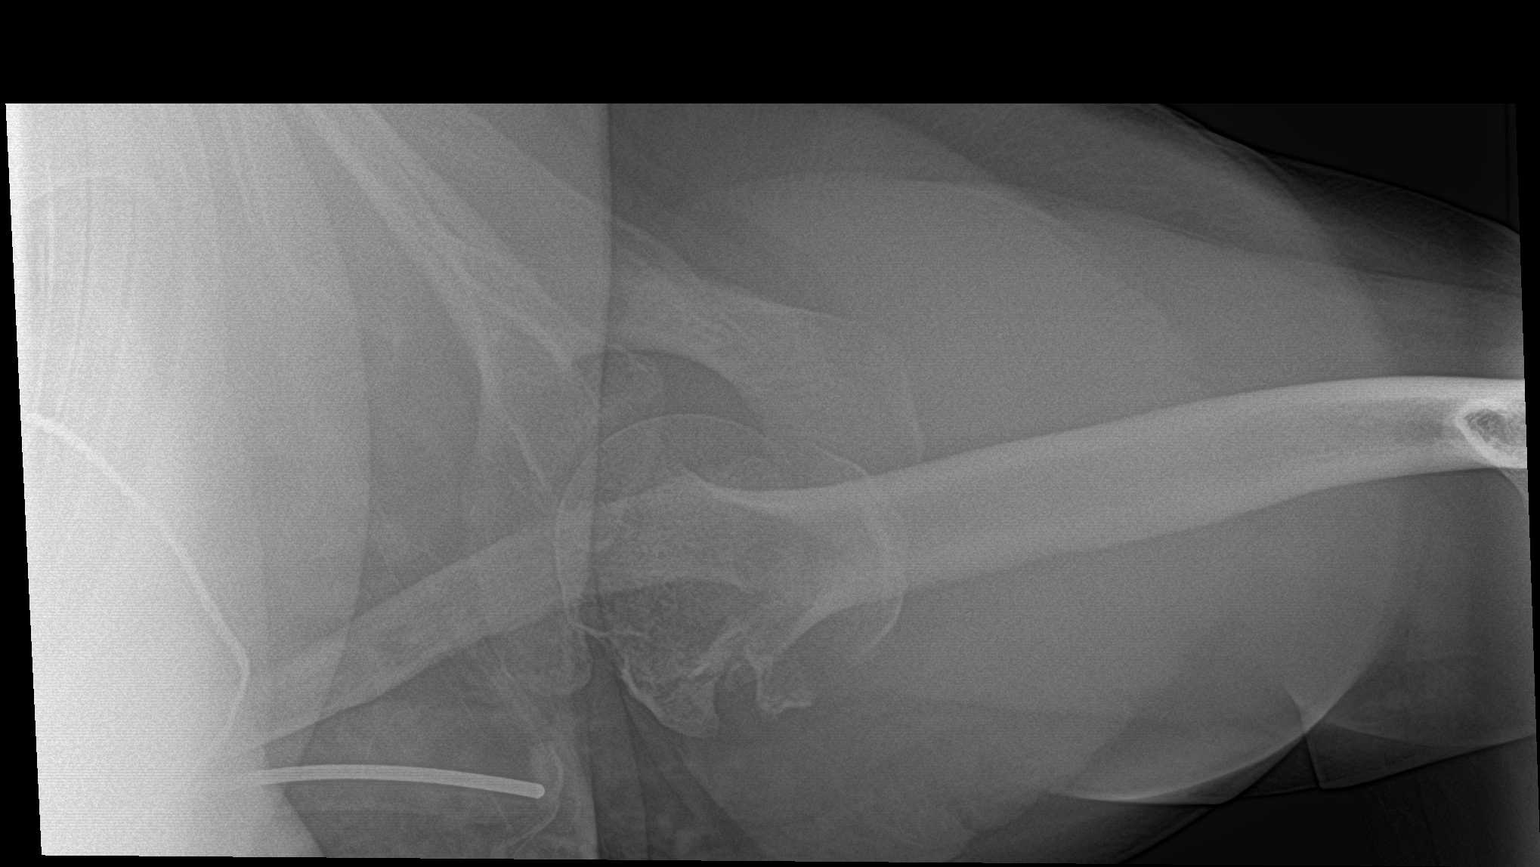

[3 of 3 positions shown; findings below may reference images not displayed]

FINDINGS: There is a comminuted appearing fracture of the left humeral head
and neck. There is a mildly displaced fracture component involving
the greater tubercle. A nondisplaced linear lucency through the neck
of the left humerus is concerning for a nondisplaced transverse
fracture. CT or MRI may provide better evaluation if clinically
indicated. The bones are osteopenic. There is no dislocation. The
soft tissues are grossly unremarkable.
IMPRESSION: Mildly displaced fracture of the greater tubercle of the humerus as
well as an apparent nondisplaced transverse fracture of the humeral
neck. No dislocation.

## 2019-03-05 ENCOUNTER — Ambulatory Visit (INDEPENDENT_AMBULATORY_CARE_PROVIDER_SITE_OTHER): Payer: Self-pay | Admitting: Orthopedic Surgery

## 2019-03-05 ENCOUNTER — Encounter: Payer: Self-pay | Admitting: Orthopedic Surgery

## 2019-03-05 ENCOUNTER — Other Ambulatory Visit: Payer: Self-pay

## 2019-03-05 VITALS — Temp 97.1°F | Ht 64.0 in | Wt 184.0 lb

## 2019-03-05 DIAGNOSIS — Z9889 Other specified postprocedural states: Secondary | ICD-10-CM

## 2019-03-05 NOTE — Progress Notes (Signed)
Chief Complaint  Patient presents with  . Routine Post Op    Post op Rt RCR 01/09/19   Encounter Diagnosis  Name Primary?  . S/P arthroscopy of right shoulder 01/09/19 Yes   No orders of the defined types were placed in this encounter.  C/o pain posterior acromion a dull ache   She had to take a Percocet because of pain at the posterior acromion her biceps does not hurt her passive motion is excellent  I recommended she stop therapy for week to give her shoulder time to rest.  She is hurting in an area where we did not really do any surgery   No therapy for 1 week  Follow-up 4 weeks try ibuprofen 800 mg 3 times a day to decrease the opioid load needed at night

## 2019-03-05 NOTE — Patient Instructions (Addendum)
No work  until 10 08/2018:  No therapy for 1 week

## 2019-03-15 ENCOUNTER — Ambulatory Visit: Payer: Self-pay | Admitting: Primary Care

## 2019-03-15 ENCOUNTER — Ambulatory Visit: Payer: Self-pay | Admitting: Internal Medicine

## 2019-03-24 ENCOUNTER — Other Ambulatory Visit: Payer: Self-pay

## 2019-03-24 DIAGNOSIS — Z20822 Contact with and (suspected) exposure to covid-19: Secondary | ICD-10-CM

## 2019-03-25 LAB — NOVEL CORONAVIRUS, NAA: SARS-CoV-2, NAA: NOT DETECTED

## 2019-03-26 ENCOUNTER — Telehealth: Payer: Self-pay | Admitting: *Deleted

## 2019-03-26 NOTE — Telephone Encounter (Signed)
Patient called back for results- notified negative COVID. Patient to continue to follow safe practices.

## 2019-03-30 ENCOUNTER — Other Ambulatory Visit: Payer: Self-pay

## 2019-03-30 ENCOUNTER — Ambulatory Visit (INDEPENDENT_AMBULATORY_CARE_PROVIDER_SITE_OTHER): Payer: Self-pay | Admitting: Orthopedic Surgery

## 2019-03-30 VITALS — BP 136/81 | HR 89 | Temp 97.1°F | Ht 64.0 in | Wt 188.0 lb

## 2019-03-30 DIAGNOSIS — Z9889 Other specified postprocedural states: Secondary | ICD-10-CM

## 2019-03-30 MED ORDER — CYCLOBENZAPRINE HCL 5 MG PO TABS: 5.0000 mg | ORAL_TABLET | Freq: Two times a day (BID) | ORAL | 1 refills | Status: DC

## 2019-03-30 NOTE — Patient Instructions (Addendum)
Take ibuprofen 800 3 x a day   Flexeril 5 mg twice a day   Apply biofreeze 3x  a day   Continue therapy

## 2019-03-30 NOTE — Progress Notes (Signed)
Post op visit c/o pain right shoulder and right biceps area  Chief Complaint  Patient presents with  . Follow-up    Recheck on right shoulder, DOS 01-09-19.   10 weeks    Passive rom is normal   Active rom is a little slow   Encounter Diagnosis  Name Primary?  . S/P arthroscopy of right shoulder 01/09/19 Yes    Meds ordered this encounter  Medications  . cyclobenzaprine (FLEXERIL) 5 MG tablet    Sig: Take 1 tablet (5 mg total) by mouth 2 (two) times daily.    Dispense:  60 tablet    Refill:  1    Fu 4 weeks   Continue PT

## 2019-04-02 ENCOUNTER — Ambulatory Visit: Payer: Self-pay | Admitting: Orthopedic Surgery

## 2019-04-11 ENCOUNTER — Ambulatory Visit: Payer: PRIVATE HEALTH INSURANCE | Admitting: Nurse Practitioner

## 2019-04-27 ENCOUNTER — Other Ambulatory Visit: Payer: Self-pay

## 2019-04-27 ENCOUNTER — Encounter: Payer: Self-pay | Admitting: Orthopedic Surgery

## 2019-04-27 ENCOUNTER — Ambulatory Visit (INDEPENDENT_AMBULATORY_CARE_PROVIDER_SITE_OTHER): Payer: Self-pay | Admitting: Orthopedic Surgery

## 2019-04-27 VITALS — BP 156/87 | HR 92 | Ht 64.0 in | Wt 188.0 lb

## 2019-04-27 DIAGNOSIS — G8918 Other acute postprocedural pain: Secondary | ICD-10-CM

## 2019-04-27 DIAGNOSIS — Z9889 Other specified postprocedural states: Secondary | ICD-10-CM

## 2019-04-27 MED ORDER — PREDNISONE 10 MG PO TABS: 10.0000 mg | ORAL_TABLET | Freq: Three times a day (TID) | ORAL | 1 refills | Status: DC

## 2019-04-27 NOTE — Progress Notes (Signed)
Chief Complaint  Patient presents with  . Post-op Follow-up    01/09/19 right shoulder scope, still has pain Arneta Cliche can not even lift coffee cup     63 year old female had arthroscopy right shoulder on May 26 currently in physical therapy currently taking Flexeril, ibuprofen 800, hydrocodone for pain  Preop diagnosis was torn right rotator cuff.  Postop diagnosis was partial tear rotator cuff right shoulder with partial biceps tendon tear and she had a biceps tenotomy with extensive debridement.    She had to stop therapy for cost reasons I believe  Biofreeze did not help  BP (!) 156/87   Pulse 92   Ht 5\' 4"  (1.626 m)   Wt 188 lb (85.3 kg)   BMI 32.27 kg/m  She is awake alert oriented x3 mood and affect are normal appearance well-groomed  Right shoulder: Exam shows tenderness in the biceps muscle decreased range of motion to only 110 degrees of flexion  She is tender over the biceps muscle and then the rotator interval and then also in the supraspinatus fossa  Neurovascular exam is intact  Encounter Diagnoses  Name Primary?  . S/P arthroscopy of right shoulder 01/09/19   . Postoperative pain Yes   She is definitely not progressing as thought.  I am concerned may be the neck problem is contributing  X-ray from earlier this year in June Dictated by Dr. Aline Brochure   Chief complaint neck and shoulder pain  Mid cervical DJD on AP x-ray  Loss of cervical lordosis, x-ray shows to C6-7 C7-T1 obscured by shoulders.  Disc spaces normal  Impression mid cervical spondylosis   We will get her to try some prednisone to see if it will decrease some of her soreness.  She can continue with ibuprofen hydrocodone and Flexeril as needed  I gave her some exercises to do daily for stiffness Codman exercises  Follow-up in 3 weeks  Meds ordered this encounter  Medications  . predniSONE (DELTASONE) 10 MG tablet    Sig: Take 1 tablet (10 mg total) by mouth 3 (three) times daily.     Dispense:  42 tablet    Refill:  1

## 2019-04-27 NOTE — Patient Instructions (Signed)
Do the home exercises from therapy twice a week and do the exercises on the sheet daily to prevent stiffness  Take the new prescription for prednisone for 2 weeks you can continue the Flexeril and hydrocodone  The biceps pain is most likely coming from the biceps tenotomy that was done.  So we should expect some soreness there.  Hopefully the prednisone will help with that

## 2019-05-18 ENCOUNTER — Encounter: Payer: Self-pay | Admitting: Orthopedic Surgery

## 2019-05-21 ENCOUNTER — Encounter: Payer: Self-pay | Admitting: Orthopedic Surgery

## 2019-05-21 ENCOUNTER — Other Ambulatory Visit: Payer: Self-pay

## 2019-05-21 ENCOUNTER — Ambulatory Visit (INDEPENDENT_AMBULATORY_CARE_PROVIDER_SITE_OTHER): Payer: Self-pay | Admitting: Orthopedic Surgery

## 2019-05-21 VITALS — BP 146/80 | HR 73 | Ht 64.0 in | Wt 188.0 lb

## 2019-05-21 DIAGNOSIS — M7521 Bicipital tendinitis, right shoulder: Secondary | ICD-10-CM

## 2019-05-21 DIAGNOSIS — G8918 Other acute postprocedural pain: Secondary | ICD-10-CM

## 2019-05-21 DIAGNOSIS — M47812 Spondylosis without myelopathy or radiculopathy, cervical region: Secondary | ICD-10-CM

## 2019-05-21 DIAGNOSIS — Z9889 Other specified postprocedural states: Secondary | ICD-10-CM

## 2019-05-21 NOTE — Patient Instructions (Addendum)
Ice the shoulder 3 x a day for   Take the same medicines    We will obtain pre-certification from the insurer and call you to schedule the study. Dr Aline Brochure will call you with the results

## 2019-05-21 NOTE — Progress Notes (Addendum)
Chief Complaint  Patient presents with  . Routine Post Op    01/09/19 Right shoulder scope    Right thumb is numb   Status post arthroscopy right shoulder biceps tenotomy.  Postoperatively was having a lot of pain in the biceps muscle area and bicipital groove she was placed on some prednisone and muscle relaxers to address this as well as the trapezius pain that she was having  She does have some cervical disc disease on x-ray  She did improve in terms of the muscle pain that she was having in the biceps muscle but still having pain in the rotator interval and bicipital groove   Procedure: Biceps tendon injection Right biceps tendon was injected The patient gave verbal consent for cortisone injection Timeout confirmed the site of injection Medications used included 40 mg of Depo-Medrol and 3 mL 1% lidocaine After alcohol and ethyl chloride preparation the point of maximal tenderness was injected over the right biceps tendon there were no complications  Right thumb is also numb dead feels like its noticeably different than the rest of the hand  She has had oral medications for this as well including therapy and muscle relaxers NSAIDs and oral steroids no improvement  Symptoms for over 3 months  X-ray with cervical disc disease C4-6 previous MRI in 2006 with bulging disc in this area   recommend MRI C SPINE

## 2019-05-21 NOTE — Addendum Note (Signed)
Addended byCandice Camp on: 05/21/2019 03:30 PM   Modules accepted: Orders

## 2019-05-29 ENCOUNTER — Ambulatory Visit (HOSPITAL_COMMUNITY)
Admission: RE | Admit: 2019-05-29 | Discharge: 2019-05-29 | Disposition: A | Discharge status: Home / Self Care | Payer: Self-pay | Source: Ambulatory Visit | Attending: Orthopedic Surgery | Admitting: Orthopedic Surgery

## 2019-05-29 ENCOUNTER — Other Ambulatory Visit: Payer: Self-pay

## 2019-05-29 DIAGNOSIS — M47812 Spondylosis without myelopathy or radiculopathy, cervical region: Secondary | ICD-10-CM | POA: Insufficient documentation

## 2019-05-31 ENCOUNTER — Encounter: Payer: Self-pay | Admitting: Orthopedic Surgery

## 2019-05-31 NOTE — Progress Notes (Unsigned)
C2-C3: Negative disc. New moderate left facet arthropathy. New mild to moderate left neuroforaminal stenosis. No spinal canal or right neuroforaminal stenosis.  C3-C4: No significant disc bulge or herniation. New mild bilateral facet arthropathy. No stenosis.  C4-C5: Mild disc degeneration without significant disc bulge or herniation. New moderate right and mild left facet arthropathy. New mild right greater than left neuroforaminal stenosis. No spinal canal stenosis.  C5-C6: Mild disc degeneration without significant disc bulge or herniation. Unchanged mild left uncovertebral hypertrophy and mild bilateral facet arthropathy. Unchanged mild left neuroforaminal stenosis. No spinal canal or right neuroforaminal stenosis.  C6-C7: Negative disc. Unchanged mild bilateral facet arthropathy. No stenosis.  C7-T1: Negative disc. Unchanged mild bilateral facet arthropathy. No stenosis.  IMPRESSION: 1. New mild-to-moderate left neuroforaminal stenosis at C2-C3 and mild right greater than left neuroforaminal stenosis at C4-C5 due to progressive facet arthropathy. 2. Unchanged mild left neuroforaminal stenosis at C5-C6 due to facet uncovertebral hypertrophy.

## 2019-06-06 ENCOUNTER — Encounter: Payer: Self-pay | Admitting: Orthopedic Surgery

## 2019-06-06 ENCOUNTER — Other Ambulatory Visit: Payer: Self-pay

## 2019-06-06 ENCOUNTER — Ambulatory Visit: Payer: Self-pay | Admitting: Orthopedic Surgery

## 2019-06-06 VITALS — BP 124/74 | HR 75 | Ht 64.0 in | Wt 188.0 lb

## 2019-06-06 DIAGNOSIS — G8918 Other acute postprocedural pain: Secondary | ICD-10-CM

## 2019-06-06 DIAGNOSIS — Z9889 Other specified postprocedural states: Secondary | ICD-10-CM

## 2019-06-06 DIAGNOSIS — M7521 Bicipital tendinitis, right shoulder: Secondary | ICD-10-CM

## 2019-06-06 DIAGNOSIS — M47812 Spondylosis without myelopathy or radiculopathy, cervical region: Secondary | ICD-10-CM

## 2019-06-06 MED ORDER — OXYCODONE-ACETAMINOPHEN 5-325 MG PO TABS: 1.0000 | ORAL_TABLET | Freq: Four times a day (QID) | ORAL | 0 refills | Status: AC | PRN

## 2019-06-06 NOTE — Progress Notes (Signed)
Chief Complaint  Patient presents with  . Shoulder Pain    right   . Results    review MRI    Surgery date May 2020 she is 5 months out of surgery now   History: Kathryn Gallegos comes in as a 63 year old right-hand-dominant female had arthroscopy of the shoulder in May 2020  She has had continued anterior shoulder pain in the bicipital groove and rotator interval with persistent lack of return to full range of motion    Summation of her surgery: PRE-OPERATIVE DIAGNOSIS:  torn rotator cuff right shoulder   POST-OPERATIVE DIAGNOSIS:  partial rotator cuff tear right shoulder, partial right biceps tendon tear   PROCEDURE:  Procedure(s): SHOULDER ARTHROSCOPY WITH biceps tenotomy, EXTENSIVE debriedment (Right) [id number]   FINDINGS: INTRA AND EXTRA ARTICULAR TEAR RIGHT BICEPS TENDON, PARTIAL TEAR  ARTICULAR SURFACE SUPRASPINATUS  RIGHT SHOULDER  Posterior labral tear.   Physical exam Right shoulder:  The pain is still located over the anterior shoulder with tenderness to the area in the bicipital groove.  She can abduct 90 degrees she can flex just past 100 degrees passive motion is 160 degrees of flexion.  She has normal external rotation with the arm at her side  Scapular pain none mild pain supraspinatus fossa  She had an MRI also to look for secondary source of pain  That showed multilevel degenerative disc disease without any significant foraminal stenosis and currently her symptoms only include intermittent numbness and tingling in the hand definitely not daily  I gave her an injection in the bicipital groove she gave consent site confirmation was done she got good relief immediately from the lidocaine  We injected lidocaine 1% 3 cc and 40 mg Depo-Medrol  She can come back in 3 weeks for me to see if we need to do this again   Assessment:   I am very concerned that she is not moving her arm or using it very much.  Of course she says it hurts to move it but I am still concerned  because she is very protective of the arm  Passive range of motion remains normal as we stated.  Other portions of the shoulder seem nonreactive to palpation for pain she does have some mild symptoms in the supraspinatus fossa  Work status:   OOW   This looks like it is going to be a long-term.  Even with her cervical disc disease in the setting of continued pain in her shoulder which is not responding well to therapy or surgery her likely return to her normal form of employment is very low  Meds ordered this encounter  Medications  . oxyCODONE-acetaminophen (PERCOCET/ROXICET) 5-325 MG tablet    Sig: Take 1 tablet by mouth every 6 (six) hours as needed for up to 5 days for severe pain.    Dispense:  20 tablet    Refill:  0   Appropriate warnings given about opioids/ Percocet  Encounter Diagnoses  Name Primary?  . S/P arthroscopy of right shoulder 01/09/19 Yes  . Postoperative pain   . Cervical spondylosis without myelopathy   . Biceps tendinitis of right upper extremity     Images:  MRI cervical spine was also ordered to look for other sources of pain CLINICAL DATA:  Right shoulder and arm pain for the past year.   EXAM: MRI CERVICAL SPINE WITHOUT CONTRAST   TECHNIQUE: Multiplanar, multisequence MR imaging of the cervical spine was performed. No intravenous contrast was administered.   COMPARISON:  Cervical spine  x-rays dated February 01, 2018. MRI cervical spine dated August 04, 2005.   FINDINGS: Alignment: Straightening of the normal cervical lordosis. Sagittal alignment is maintained.   Vertebrae: No fracture, evidence of discitis, or bone lesion.   Cord: Normal signal and morphology.   Posterior Fossa, vertebral arteries, paraspinal tissues: Negative.   Disc levels:   C2-C3: Negative disc. New moderate left facet arthropathy. New mild to moderate left neuroforaminal stenosis. No spinal canal or right neuroforaminal stenosis.   C3-C4: No significant disc bulge  or herniation. New mild bilateral facet arthropathy. No stenosis.   C4-C5: Mild disc degeneration without significant disc bulge or herniation. New moderate right and mild left facet arthropathy. New mild right greater than left neuroforaminal stenosis. No spinal canal stenosis.   C5-C6: Mild disc degeneration without significant disc bulge or herniation. Unchanged mild left uncovertebral hypertrophy and mild bilateral facet arthropathy. Unchanged mild left neuroforaminal stenosis. No spinal canal or right neuroforaminal stenosis.   C6-C7: Negative disc. Unchanged mild bilateral facet arthropathy. No stenosis.   C7-T1: Negative disc. Unchanged mild bilateral facet arthropathy. No stenosis.   IMPRESSION: 1. New mild-to-moderate left neuroforaminal stenosis at C2-C3 and  1a. C3-C4: No significant disc bulge or herniation. New mild bilateral facet arthropathy. No stenosis.  mild right greater than left neuroforaminal stenosis at C4-C5 due to progressive facet arthropathy.   2. Unchanged mild left neuroforaminal stenosis at C5-C6 due to facet uncovertebral hypertrophy.     Electronically Signed   By: Titus Dubin M.D.   On: 05/30/2019 10:30

## 2019-06-15 ENCOUNTER — Other Ambulatory Visit: Payer: Self-pay | Admitting: Cardiology

## 2019-06-27 ENCOUNTER — Ambulatory Visit: Payer: Self-pay | Admitting: Orthopedic Surgery

## 2019-07-04 ENCOUNTER — Ambulatory Visit (INDEPENDENT_AMBULATORY_CARE_PROVIDER_SITE_OTHER): Payer: Self-pay | Admitting: Orthopedic Surgery

## 2019-07-04 ENCOUNTER — Other Ambulatory Visit: Payer: Self-pay

## 2019-07-04 ENCOUNTER — Encounter: Payer: Self-pay | Admitting: Orthopedic Surgery

## 2019-07-04 VITALS — BP 145/82 | HR 92 | Ht 64.0 in | Wt 188.0 lb

## 2019-07-04 DIAGNOSIS — Z9889 Other specified postprocedural states: Secondary | ICD-10-CM

## 2019-07-04 DIAGNOSIS — M7521 Bicipital tendinitis, right shoulder: Secondary | ICD-10-CM

## 2019-07-04 DIAGNOSIS — G8918 Other acute postprocedural pain: Secondary | ICD-10-CM

## 2019-07-04 DIAGNOSIS — M47812 Spondylosis without myelopathy or radiculopathy, cervical region: Secondary | ICD-10-CM

## 2019-07-04 NOTE — Progress Notes (Signed)
Chief Complaint  Patient presents with  . Routine Post Op    01/09/19 right shoulder     Kathryn Gallegos had a partial rotator cuff tear partial biceps tendon tear was treated with shoulder arthroscopy biceps tenotomy extensive debridement right shoulder for synovitis and she also had a posterior labral tear which was nondisplaced  Surgery was in May 2020  She received an injection over the bicipital groove for continued pain on her last visit October 21 and she did get some relief in that area unfortunately she got depigmentation of the skin but definite improvement in overall pain in that region  However, she is having a lot of pain in her trapezius muscle in the neck to the point where she cannot wear a bra strap over the right side of her shoulder  She currently takes hydrocodone once a day or every other day Advil 800 mg 3 times a day and on really bad days which maybe once or twice a week she takes oxycodone for pain  She also notes difficulties with activities of daily living such as getting dressed putting on and off shirt or doing any kind of work where it requires moving the shoulder arm away from the body  Examination reveals depigmentation of the skin over the injection site with decreased tenderness  With her arm at her side she can externally rotate the arm 45 degrees internal rotation is normal she has painful extension of the elbow painful flexion of the shoulder only to 90 degrees  She has extreme tenderness over the base of the cervical spine as well as the right trap  Muscle shows increased muscle tension without tremor  Cervical MRI CLINICAL DATA:  Right shoulder and arm pain for the past year.   EXAM: MRI CERVICAL SPINE WITHOUT CONTRAST   TECHNIQUE: Multiplanar, multisequence MR imaging of the cervical spine was performed. No intravenous contrast was administered.   COMPARISON:  Cervical spine x-rays dated February 01, 2018. MRI cervical spine dated August 04, 2005.    FINDINGS: Alignment: Straightening of the normal cervical lordosis. Sagittal alignment is maintained.   Vertebrae: No fracture, evidence of discitis, or bone lesion.   Cord: Normal signal and morphology.   Posterior Fossa, vertebral arteries, paraspinal tissues: Negative.   Disc levels:   C2-C3: Negative disc. New moderate left facet arthropathy. New mild to moderate left neuroforaminal stenosis. No spinal canal or right neuroforaminal stenosis.   C3-C4: No significant disc bulge or herniation. New mild bilateral facet arthropathy. No stenosis.   C4-C5: Mild disc degeneration without significant disc bulge or herniation. New moderate right and mild left facet arthropathy. New mild right greater than left neuroforaminal stenosis. No spinal canal stenosis.   C5-C6: Mild disc degeneration without significant disc bulge or herniation. Unchanged mild left uncovertebral hypertrophy and mild bilateral facet arthropathy. Unchanged mild left neuroforaminal stenosis. No spinal canal or right neuroforaminal stenosis.   C6-C7: Negative disc. Unchanged mild bilateral facet arthropathy. No stenosis.   C7-T1: Negative disc. Unchanged mild bilateral facet arthropathy. No stenosis.   IMPRESSION: 1. New mild-to-moderate left neuroforaminal stenosis at C2-C3 and mild right greater than left neuroforaminal stenosis at C4-C5 due to progressive facet arthropathy. 2. Unchanged mild left neuroforaminal stenosis at C5-C6 due to facet uncovertebral hypertrophy.     Electronically Signed   By: Titus Dubin M.D.   On: 05/30/2019 10:30  I have recommended that she get back into some exercises for the shoulder with Codman exercises to prevent adhesive capsulitis  Recommend  physical therapy for the shoulder  She can continue with the ibuprofen and hydrocodone again we discussed the risk of opioids regarding the oxycodone  Follow-up will be 1 month  Patient is not able to work at this  time  Encounter Diagnoses  Name Primary?  . Cervical spondylosis without myelopathy Yes  . S/P arthroscopy of right shoulder 01/09/19   . Postoperative pain   . Biceps tendinitis of right upper extremity

## 2019-07-04 NOTE — Patient Instructions (Signed)
Start home exercises for the shoulder  PT for the cervical spine will be ordered  Continue current medications  Continue out of work until next visit where will be reassessed

## 2019-08-06 ENCOUNTER — Ambulatory Visit: Payer: Self-pay | Admitting: Orthopedic Surgery

## 2019-08-07 ENCOUNTER — Ambulatory Visit: Payer: Self-pay | Admitting: Orthopedic Surgery

## 2019-08-20 ENCOUNTER — Ambulatory Visit: Payer: Self-pay | Admitting: Orthopedic Surgery

## 2019-08-20 ENCOUNTER — Encounter: Payer: Self-pay | Admitting: Orthopedic Surgery

## 2019-08-20 ENCOUNTER — Other Ambulatory Visit: Payer: Self-pay

## 2019-08-20 VITALS — BP 130/79 | HR 80 | Ht 64.0 in | Wt 190.0 lb

## 2019-08-20 DIAGNOSIS — M47812 Spondylosis without myelopathy or radiculopathy, cervical region: Secondary | ICD-10-CM

## 2019-08-20 DIAGNOSIS — G8929 Other chronic pain: Secondary | ICD-10-CM

## 2019-08-20 DIAGNOSIS — M25511 Pain in right shoulder: Secondary | ICD-10-CM | POA: Diagnosis not present

## 2019-08-20 DIAGNOSIS — M7711 Lateral epicondylitis, right elbow: Secondary | ICD-10-CM

## 2019-08-20 DIAGNOSIS — Z9889 Other specified postprocedural states: Secondary | ICD-10-CM | POA: Diagnosis not present

## 2019-08-20 NOTE — Progress Notes (Signed)
Chief Complaint  Patient presents with  . Shoulder Pain    right shoulder painful/ getting worse over weekend     64 year old female status post right shoulder arthroscopy with debridement and biceps tenotomy  Presents today with several ongoing complaints  Patient planes of pain right shoulder pain is located over the top of the shoulder just at the acromion with tenderness and pain radiating from the cervical spine through the right trapezius muscle.  The pain in the biceps area has resolved and the deltoid pain is gone  Pain in the cervical spine at the base of the cervical spine  New onset pain right elbow after placing Christmas ornaments.  This is associated with trouble gripping holding coffee cup and reaching forward.  Review of systems no current numbness tingling gait disturbance  Past Medical History:  Diagnosis Date  . Anxiety   . Anxiety and depression   . Asthma   . Borderline diabetes   . Carpal tunnel syndrome 07/05/2007   Qualifier: Diagnosis of  By: [name], [name]    . Dyspnea on exertion 09/22/2018  . HIGH BLOOD PRESSURE 07/04/2007   Qualifier: Diagnosis of  By: [name], [name]    . HTN (hypertension)   . Hyperglycemia   . Hyperlipidemia   . IMPINGEMENT SYNDROME 03/09/2010   Qualifier: Diagnosis of  By: [name], [name]    . Obesity   . PSVT (paroxysmal supraventricular tachycardia) ([place]) 09/22/2018  . Pure hypercholesterolemia 09/22/2018  . Rapid palpitations   . Seasonal allergies     BP 130/79   Pulse 80   Ht 5\' 4"  (1.626 m)   Wt 190 lb (86.2 kg)   BMI 32.61 kg/m   Appearance otherwise normal vital signs are stable she has good pulses in both upper extremities  She is awake alert and oriented x3 mood and affect are normal and pleasant  No gross sensory loss in the upper extremity on the right  Cervical spine is tender she has painful range of motion tenderness in the right trapezius muscle  Tenderness in the right elbow lateral  epicondyle and lateral soft tissue mobile wad of 3 pain with wrist extension also noted lateral epicondyle elbow range of motion normal with no instability skin is intact no rash  Right shoulder nontender over the biceps area mild tenderness in the subacromial fossa and more significant tenderness in the trapezius muscle and the medial side of the acromion posterior to the [facility] joint  Her passive motion is now 150 degrees in the right shoulder external rotation now is full with the arm at her side she still has some rotator cuff weakness  Shoulder is stable on the right  Encounter Diagnoses  Name Primary?  . S/P arthroscopy of right shoulder 01/09/19   . Cervical spondylosis without myelopathy   . Right tennis elbow Yes  . Chronic right shoulder pain     Medical decision making  1 acute uncomplicated problem elbow 2 chronic problems 1 with exacerbation 1 not meeting treatment goal  Injection  Epidural ordered  Level 4   Procedure note injection for right tennis elbow   Diagnosis right tennis elbow  Anesthesia ethyl chloride was used Alcohol use is clean the skin  After we obtained verbal consent and timeout a 25-gauge needle was used to inject 40 mg of Depo-Medrol and 3 cc of 1% lidocaine just distal to the insertion of the ECRB  There were no complications and a sterile bandage was applied.

## 2019-08-21 ENCOUNTER — Telehealth: Payer: Self-pay | Admitting: Orthopedic Surgery

## 2019-08-21 NOTE — Telephone Encounter (Signed)
Patient has injections scheduled with Dr. Ernestina Patches on 09/04/19.   She wants to know when she is to come back here for her regular visits.  There was no indication of return appointment on her AVS from yesterday  Please advise  Thanks

## 2019-08-21 NOTE — Telephone Encounter (Signed)
30 days.

## 2019-08-23 ENCOUNTER — Other Ambulatory Visit: Payer: Self-pay | Admitting: Cardiology

## 2019-09-04 ENCOUNTER — Encounter: Payer: Self-pay | Admitting: Physical Medicine and Rehabilitation

## 2019-09-04 ENCOUNTER — Other Ambulatory Visit: Payer: Self-pay

## 2019-09-04 ENCOUNTER — Ambulatory Visit: Payer: BC Managed Care – PPO | Admitting: Physical Medicine and Rehabilitation

## 2019-09-04 VITALS — BP 127/88 | HR 73

## 2019-09-04 DIAGNOSIS — M25511 Pain in right shoulder: Secondary | ICD-10-CM

## 2019-09-04 DIAGNOSIS — G8929 Other chronic pain: Secondary | ICD-10-CM

## 2019-09-04 DIAGNOSIS — M542 Cervicalgia: Secondary | ICD-10-CM

## 2019-09-04 DIAGNOSIS — M7918 Myalgia, other site: Secondary | ICD-10-CM

## 2019-09-04 DIAGNOSIS — R202 Paresthesia of skin: Secondary | ICD-10-CM | POA: Diagnosis not present

## 2019-09-04 NOTE — Progress Notes (Signed)
Posterior neck pain. Pain in top of right shoulder radiating to elbow. Difficulty lifting with right arm. Numbness and tingling in right hand/ fingers. Numeric Pain Rating Scale and Functional Assessment Average Pain 10 Pain Right Now 10 My pain is constant, dull and aching Pain is worse with: lying down at night Pain improves with: medication   In the last MONTH (on 0-10 scale) has pain interfered with the following?  1. General activity like being  able to carry out your everyday physical activities such as walking, climbing stairs, carrying groceries, or moving a chair?  Rating(9)  2. Relation with others like being able to carry out your usual social activities and roles such as  activities at home, at work and in your community. Rating(9)  3. Enjoyment of life such that you have  been bothered by emotional problems such as feeling anxious, depressed or irritable?  Rating(7)

## 2019-09-14 ENCOUNTER — Other Ambulatory Visit: Payer: Self-pay

## 2019-09-21 ENCOUNTER — Other Ambulatory Visit: Payer: Self-pay | Admitting: Obstetrics and Gynecology

## 2019-09-21 DIAGNOSIS — Z1231 Encounter for screening mammogram for malignant neoplasm of breast: Secondary | ICD-10-CM

## 2019-09-23 ENCOUNTER — Other Ambulatory Visit: Payer: Self-pay | Admitting: Cardiology

## 2019-09-24 ENCOUNTER — Telehealth: Payer: Self-pay

## 2019-09-24 ENCOUNTER — Other Ambulatory Visit: Payer: Self-pay | Admitting: Nurse Practitioner

## 2019-09-24 MED ORDER — DILTIAZEM HCL ER COATED BEADS 240 MG PO CP24: 240.0000 mg | ORAL_CAPSULE | Freq: Every day | ORAL | 1 refills | Status: DC

## 2019-09-24 NOTE — Telephone Encounter (Signed)
She only has minor arrhythmias and hypertension and hence cannot be written off like that unless she has issues with immunity, etc. I am sorry.

## 2019-09-24 NOTE — Telephone Encounter (Signed)
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 Please advise.//ah

## 2019-09-26 NOTE — Telephone Encounter (Signed)
Refill but pt has an appt 02/24

## 2019-09-26 NOTE — Telephone Encounter (Signed)
Pt aware.//ah

## 2019-10-01 ENCOUNTER — Ambulatory Visit: Payer: BC Managed Care – PPO | Admitting: Orthopedic Surgery

## 2019-10-03 ENCOUNTER — Encounter: Payer: Self-pay | Admitting: Orthopedic Surgery

## 2019-10-03 ENCOUNTER — Other Ambulatory Visit: Payer: Self-pay

## 2019-10-03 ENCOUNTER — Ambulatory Visit (INDEPENDENT_AMBULATORY_CARE_PROVIDER_SITE_OTHER): Payer: BC Managed Care – PPO | Admitting: Orthopedic Surgery

## 2019-10-03 VITALS — BP 140/88 | HR 77 | Ht 64.0 in | Wt 190.0 lb

## 2019-10-03 DIAGNOSIS — Z9889 Other specified postprocedural states: Secondary | ICD-10-CM | POA: Diagnosis not present

## 2019-10-03 DIAGNOSIS — M25511 Pain in right shoulder: Secondary | ICD-10-CM | POA: Diagnosis not present

## 2019-10-03 DIAGNOSIS — M542 Cervicalgia: Secondary | ICD-10-CM

## 2019-10-03 DIAGNOSIS — G8929 Other chronic pain: Secondary | ICD-10-CM | POA: Diagnosis not present

## 2019-10-03 MED ORDER — IBUPROFEN 800 MG PO TABS: 800.0000 mg | ORAL_TABLET | Freq: Three times a day (TID) | ORAL | 1 refills | Status: DC

## 2019-10-03 MED ORDER — CYCLOBENZAPRINE HCL 5 MG PO TABS: 5.0000 mg | ORAL_TABLET | Freq: Two times a day (BID) | ORAL | 1 refills | Status: DC

## 2019-10-03 NOTE — Patient Instructions (Signed)
MRI: shoulder   Return to office MRI results

## 2019-10-03 NOTE — Progress Notes (Signed)
Chief Complaint  Patient presents with  . Neck Pain    had injection by Dr Ernestina Patches / helped a little, but did not care for Dr Elenor Legato had surgery on her right shoulder had arthroscopy biceps tenotomy extensive debridement  She complains of a 50% reduction in the pain in her neck after the injection by Dr. Ernestina Patches  She was concerned that he said she did have bulging disc I said she did have bulging disc.  I was using layman's terms for it what is basically cervical spondylosis at multiple levels  See MRI  She did get a 50% reduction in her neck pain  She still having stiffness and pain in her right shoulder  Reexamination of the right shoulder reveals that she has tenderness over the bicipital groove she has weakness in the biceps and brachialis in terms of flexion of the right elbow  Her passive range of motion in the right shoulder is 150 degrees of flexion 90 degrees of abduction and 50 degrees of external rotation when her arm is at her side  Recommend repeat MRI to see if any biceps tendon is still in the bicipital groove and may be causing pain and irritation there.  We will also look at the rotator cuff again which did not have a significant tear at the time of arthroscopy  Meds ordered this encounter  Medications  . cyclobenzaprine (FLEXERIL) 5 MG tablet    Sig: Take 1 tablet (5 mg total) by mouth 2 (two) times daily.    Dispense:  60 tablet    Refill:  1  . ibuprofen (ADVIL) 800 MG tablet    Sig: Take 1 tablet (800 mg total) by mouth 3 (three) times daily with meals.    Dispense:  90 tablet    Refill:  1   Encounter Diagnoses  Name Primary?  . Chronic right shoulder pain Yes  . S/P arthroscopy of right shoulder 01/09/19   . Neck pain      Come back after MRI

## 2019-10-11 ENCOUNTER — Encounter: Payer: Self-pay | Admitting: Cardiology

## 2019-10-11 ENCOUNTER — Other Ambulatory Visit: Payer: Self-pay

## 2019-10-11 ENCOUNTER — Ambulatory Visit: Payer: BC Managed Care – PPO | Admitting: Cardiology

## 2019-10-11 VITALS — BP 130/80 | HR 71 | Temp 96.7°F | Ht 64.0 in | Wt 185.9 lb

## 2019-10-11 DIAGNOSIS — E7800 Pure hypercholesterolemia, unspecified: Secondary | ICD-10-CM

## 2019-10-11 DIAGNOSIS — I471 Supraventricular tachycardia: Secondary | ICD-10-CM | POA: Diagnosis not present

## 2019-10-11 DIAGNOSIS — I10 Essential (primary) hypertension: Secondary | ICD-10-CM

## 2019-10-11 MED ORDER — DILTIAZEM HCL ER COATED BEADS 240 MG PO CP24: 240.0000 mg | ORAL_CAPSULE | Freq: Every day | ORAL | 3 refills | Status: DC

## 2019-10-11 NOTE — Patient Instructions (Signed)
Hold Crestor (resuvastatin) for 4 weeks for shoulder injury

## 2019-10-11 NOTE — Progress Notes (Signed)
Primary Physician/Referring:  Minette Brine, FNP  Patient ID: Kathryn Gallegos, female    DOB: [birth year], 64 y.o.   MRN: [id number]  Chief Complaint  Patient presents with  . Tachycardia  . Hypertension  . Hyperlipidemia  . Follow-up    1 year c/o chest pain   HPI:    Kathryn Gallegos  is a 64 y.o.  female  with  hypertension, bronchial ashtma, hyperlipidemia, and hyperglycemia seen in the emergency room on 05/07/2017 with SVT at the rate of 172 bpm with secondary ST-T wave changes in the inferior and lateral leads suggestive of ischemia. She converted to sinus rhythm with adenosine was and discharged home. Was evaluated by EP and she preferred medical therapy due to fear of pacemaker implantation. Now on Diltiazem and states she has not had any further episodes.  She is presently doing well and she has had about 2-3 episodes of breakthrough SVT.  Otherwise doing well, she is trying her best to lose weight as well.  Past Medical History:  Diagnosis Date  . Anxiety   . Anxiety and depression   . Asthma   . Borderline diabetes   . Carpal tunnel syndrome 07/05/2007   Qualifier: Diagnosis of  By: [name], [name]    . Dyspnea on exertion 09/22/2018  . HIGH BLOOD PRESSURE 07/04/2007   Qualifier: Diagnosis of  By: [name], [name]    . HTN (hypertension)   . Hyperglycemia   . Hyperlipidemia   . IMPINGEMENT SYNDROME 03/09/2010   Qualifier: Diagnosis of  By: [name], [name]    . Obesity   . PSVT (paroxysmal supraventricular tachycardia) ([place]) 09/22/2018  . Pure hypercholesterolemia 09/22/2018  . Rapid palpitations   . Seasonal allergies    Past Surgical History:  Procedure Laterality Date  . BREAST BIOPSY Left   . SHOULDER ARTHROSCOPY WITH BICEPSTENOTOMY Right 01/09/2019   Procedure: SHOULDER ARTHROSCOPY WITH biceps tenotomy, limited debriedment;  Surgeon: [name], MD;  Location: [facility];  Service: Orthopedics;  Laterality: Right;  . TOTAL ABDOMINAL  HYSTERECTOMY    . TUBAL LIGATION     Family History  Problem Relation Age of Onset  . Pancreatic cancer Mother   . Esophageal cancer Maternal Grandmother   . Heart disease Maternal Grandfather   . Heart disease Maternal Uncle   . Colon cancer Maternal Aunt   . Stomach cancer Maternal Aunt   . Arthritis Other   . Cancer Other   . Diabetes Other   . Breast cancer Neg Hx     Social History   Tobacco Use  . Smoking status: Former Smoker    Packs/day: 0.25    Years: 2.00    Pack years: 0.50    Types: Cigarettes    Quit date: 1970    Years since quitting: 51.1  . Smokeless tobacco: Never Used  Substance Use Topics  . Alcohol use: No   ROS  Review of Systems  Cardiovascular: Positive for dyspnea on exertion (chronic and stable) and palpitations.  Musculoskeletal: Positive for back pain.   Objective  Blood pressure 130/80, pulse 71, temperature (!) 96.7 F (35.9 C), height 5' 4"  (1.626 m), weight 185 lb 14.4 oz (84.3 kg), SpO2 96 %.  Vitals with BMI 10/11/2019 10/03/2019 09/04/2019  Height 5' 4"  5' 4"  -  Weight 185 lbs 14 oz 190 lbs -  BMI 22.02 54.2 -  Systolic 706 237 628  Diastolic 80 88 88  Pulse 71 77 73  Physical Exam  Cardiovascular: Normal rate, regular rhythm, normal heart sounds and intact distal pulses. Exam reveals no gallop.  No murmur heard. No leg edema, no JVD.  Pulmonary/Chest: Effort normal and breath sounds normal.  Abdominal: Soft. Bowel sounds are normal.   Laboratory examination:   Recent Labs    01/05/19 1431  NA 138  K 3.3*  CL 102  CO2 27  GLUCOSE 110*  BUN 12  CREATININE 0.69  CALCIUM 9.0  GFRNONAA >60  GFRAA >60   CrCl cannot be calculated (Patient's most recent lab result is older than the maximum 21 days allowed.).  CMP Latest Ref Rng & Units 01/05/2019 10/11/2018 05/07/2017  Glucose 70 - 99 mg/dL 110(H) 110(H) 159(H)  BUN 8 - 23 mg/dL 12 11 14   Creatinine 0.44 - 1.00 mg/dL 0.69 0.83 1.24(H)  Sodium 135 - 145 mmol/L 138 143  140  Potassium 3.5 - 5.1 mmol/L 3.3(L) 4.3 3.6  Chloride 98 - 111 mmol/L 102 104 107  CO2 22 - 32 mmol/L 27 25 22   Calcium 8.9 - 10.3 mg/dL 9.0 9.7 9.5  Total Protein 6.0 - 8.5 g/dL - 7.5 -  Total Bilirubin 0.0 - 1.2 mg/dL - 0.5 -  Alkaline Phos 39 - 117 IU/L - 116 -  AST 0 - 40 IU/L - 26 -  ALT 0 - 32 IU/L - 29 -   CBC Latest Ref Rng & Units 01/05/2019 10/11/2018 05/07/2017  WBC 4.0 - 10.5 K/uL 6.6 7.3 7.9  Hemoglobin 12.0 - 15.0 g/dL 14.7 14.9 16.4(H)  Hematocrit 36.0 - 46.0 % 44.7 43.8 47.6(H)  Platelets 150 - 400 K/uL 216 254 237   Lipid Panel     Component Value Date/Time   CHOL 145 10/11/2018 1126   TRIG 87 10/11/2018 1126   HDL 55 10/11/2018 1126   CHOLHDL 2.6 10/11/2018 1126   LDLCALC 73 10/11/2018 1126   HEMOGLOBIN A1C Lab Results  Component Value Date   HGBA1C 5.7 (H) 10/11/2018   TSH No results for input(s): TSH in the last 8760 hours.  External labs:   Labs 09/29/2017: Hemoglobin A1c 5.7%.  TSH normal.  Vitamin D normal.  CBC normal.  Creatinine 0.7, EGFR 84/97, potassium 4.3, CMP normal.  Cholesterol 192, triglycerides 92, HDL 58, LDL 116.  Medications and allergies   Allergies  Allergen Reactions  . Amoxicillin Other (See Comments)    Inflammation in pancreas.  Did it involve swelling of the face/tongue/throat, SOB, or low BP? No Did it involve sudden or severe rash/hives, skin peeling, or any reaction on the inside of your mouth or nose? No Did you need to seek medical attention at a hospital or doctor's office? Yes When did it last happen?5-6 years ago If all above answers are "NO", may proceed with cephalosporin use.      Current Outpatient Medications  Medication Instructions  . acetaminophen (TYLENOL) 1,300 mg, Oral, Every 8 hours PRN  . Albuterol Sulfate (PROAIR RESPICLICK) 407 (90 Base) MCG/ACT AEPB 2 puffs, Inhalation, Every 4 hours PRN  . cyclobenzaprine (FLEXERIL) 5 mg, Oral, 2 times daily  . diltiazem (CARDIZEM CD) 240 mg, Oral,  Daily  . esomeprazole (NEXIUM) 20 mg, Oral, Daily PRN  . fluticasone (FLONASE) 50 MCG/ACT nasal spray 1 spray, Each Nare, Daily PRN  . HYDROcodone-acetaminophen (NORCO/VICODIN) 5-325 MG tablet 1 tablet, Oral, Every 6 hours PRN  . ibuprofen (ADVIL) 800 mg, Oral, 3 times daily with meals  . levocetirizine (XYZAL) 5 mg, Oral, Daily  . Probiotic Product (  ALIGN PO) 1 capsule, Oral, Daily  . promethazine (PHENERGAN) 12.5 mg, Oral, Every 6 hours PRN  . rosuvastatin (CRESTOR) 10 MG tablet TAKE ONE TABLET BY MOUTH DAILY  . sertraline (ZOLOFT) 50 MG tablet TAKE 1 TABLET BY MOUTH EVERY DAY  . Tetrahydrozoline HCl (VISINE OP) 1 drop, Both Eyes, Daily PRN  . Vitamin D3 5,000 Units, Oral, Daily  . ZINC-VITAMIN C PO 1 tablet, Oral, Daily    Radiology:   No results found.  Cardiac Studies:   Exercise sestamibi stress test 07/21/2018:  1. The patient performed treadmill exercise using Bruce protocol, completing 6:45 minutes. The patient completed an estimated workload of 8.2 METS, reaching 87% of the maximum predicted heart rate. Resting hypertension 150/100 mmHg with exaggerated exercise BP response, peak BP 230/110 mmHg. No stress symptoms reported. Exercise capacity was normal. No ischemic changes seen on stress electrocardiogram.   2. The overall quality of the study is excellent. There is no evidence of abnormal lung activity. Stress and rest SPECT images demonstrate homogeneous tracer distribution throughout the myocardium. Gated SPECT imaging reveals normal myocardial thickening and wall motion. The left ventricular ejection fraction was normal (73%).  3. Low risk study.  Echocardiogram 07/05/2018: Left ventricle cavity is normal in size. Mild concentric hypertrophy of the left ventricle. Normal global wall motion. Doppler evidence of grade I (impaired) diastolic dysfunction, normal LAP. Calculated EF 67%. Mild (Grade I) mitral regurgitation. Mild tricuspid regurgitation.  No evidence of  pulmonary hypertension.  EKG 10/10/2018: Normal sinus rhythm at rate of 71 bpm, normal axis, no evidence of ischemia, normal EKG.   No significant change from  EKG 05/14/2017: Normal sinus rhythm at the rate of 71 bpm, normal intervals.  No evidence of ischemia, normal EKG.   Assessment     ICD-10-CM   1. Supraventricular tachycardia, paroxysmal (HCC)  I47.1 diltiazem (CARDIZEM CD) 240 MG 24 hr capsule  2. Essential hypertension  I10 EKG 12-Lead    diltiazem (CARDIZEM CD) 240 MG 24 hr capsule  3. Pure hypercholesterolemia  E78.00     Meds ordered this encounter  Medications  . diltiazem (CARDIZEM CD) 240 MG 24 hr capsule    Sig: Take 1 capsule (240 mg total) by mouth daily.    Dispense:  90 capsule    Refill:  3    Medications Discontinued During This Encounter  Medication Reason  . diltiazem (CARDIZEM CD) 240 MG 24 hr capsule Reorder     Recommendations:   ZULEIMA HASER  is a 64 y.o.  female  with  hypertension, bronchial ashtma, hyperlipidemia, and hyperglycemia seen in the emergency room on 05/07/2017 with SVT at the rate of 172 bpm with secondary ST-T wave changes in the inferior and lateral leads suggestive of ischemia. She converted to sinus rhythm with adenosine was and discharged home. Was evaluated by EP and she preferred medical therapy due to fear of pacemaker implantation. Now on Diltiazem and states she has not had any further episodes.   She is presently doing well and has had 2 episodes of breakthrough SVT and resolved with Valsalva maneuver.  Blood pressures well controlled, lipids are also controlled.  I renewed her prescription.  She has developed right shoulder injury, advised her to hold statins for 4 weeks until right shoulder recovers.  I will see her back on an annual basis.  She is concerned about going to ablation and she is extremely anxious regarding probable need for pacemaker.  She also requests a letter to be written  to her college not to attend in  person but would like to continue doing online courses as she is concerned about COVID-19.  With her anxiety being a major issue, potential for escalation of SVT and elevated blood pressure, I will send her a note.  I will see her back in a year.  Adrian Prows, MD, [facility] 10/11/2019, 4:47 PM [facility] [place] Office: [phone]

## 2019-10-17 ENCOUNTER — Ambulatory Visit (INDEPENDENT_AMBULATORY_CARE_PROVIDER_SITE_OTHER): Payer: BC Managed Care – PPO | Admitting: Nurse Practitioner

## 2019-10-17 ENCOUNTER — Other Ambulatory Visit: Payer: Self-pay

## 2019-10-17 ENCOUNTER — Encounter: Payer: Self-pay | Admitting: Nurse Practitioner

## 2019-10-17 VITALS — BP 120/82 | Temp 98.3°F | Ht 63.4 in | Wt 187.2 lb

## 2019-10-17 DIAGNOSIS — I10 Essential (primary) hypertension: Secondary | ICD-10-CM

## 2019-10-17 DIAGNOSIS — W19XXXD Unspecified fall, subsequent encounter: Secondary | ICD-10-CM

## 2019-10-17 DIAGNOSIS — Z0000 Encounter for general adult medical examination without abnormal findings: Secondary | ICD-10-CM

## 2019-10-17 DIAGNOSIS — G8929 Other chronic pain: Secondary | ICD-10-CM

## 2019-10-17 DIAGNOSIS — M25511 Pain in right shoulder: Secondary | ICD-10-CM

## 2019-10-17 DIAGNOSIS — R7303 Prediabetes: Secondary | ICD-10-CM | POA: Diagnosis not present

## 2019-10-17 DIAGNOSIS — E782 Mixed hyperlipidemia: Secondary | ICD-10-CM

## 2019-10-17 DIAGNOSIS — F419 Anxiety disorder, unspecified: Secondary | ICD-10-CM

## 2019-10-17 LAB — POCT URINALYSIS DIPSTICK
Bilirubin, UA: NEGATIVE
Glucose, UA: NEGATIVE
Ketones, UA: NEGATIVE
Nitrite, UA: NEGATIVE
Protein, UA: NEGATIVE
Spec Grav, UA: >=1.03 — AB (ref 1.010–1.025)
Urobilinogen, UA: 0.2 E.U./dL
pH, UA: 6 (ref 5.0–8.0)

## 2019-10-17 NOTE — Progress Notes (Signed)
This visit occurred during the SARS-CoV-2 public health emergency.  Safety protocols were in place, including screening questions prior to the visit, additional usage of staff PPE, and extensive cleaning of exam room while observing appropriate contact time as indicated for disinfecting solutions.  Subjective:     Patient ID: Kathryn Gallegos , female    DOB: 08-Nov-1955 , 64 y.o.   MRN: [id number]   Chief Complaint  Patient presents with  . Annual Exam    HPI  Here for HM  She is being followed by Dr Einar Gip - she ate pork and felt dizzy later in the week. She was stopped on the rosuvastatin for 4 weeks.  Continues to take Diltiazem  She is seeing Dr. Aline Brochure for her right shoulder. She has a scheduled appt for MRI.   She reports she due to her history of anxiety and depression.  She reports she is unable to hold her arm up for long periods or do too much typing will have pain to her right shoulder. She had a fall in 2019 injuring her right shoulder had rotator cuff surgery and repair to her biceps she reports she is on short term disability for her shoulder.  She continues to be in school and will graduate in December.    She and her husband have also split.    The patient states she uses status post hysterectomy for birth control.  Negative for Dysmenorrhea and Negative for Menorrhagia Mammogram last done 10/23/2018.  Negative for: breast discharge, breast lump(s), breast pain and breast self exam.  Pertinent negatives include abnormal bleeding (hematology), anxiety, decreased libido, depression, difficulty falling sleep, dyspareunia, history of infertility, nocturia, sexual dysfunction, sleep disturbances, urinary incontinence, urinary urgency, vaginal discharge and vaginal itching. Diet regular. The patient states her exercise level is none.    The patient's tobacco use is:  Social History   Tobacco Use  Smoking Status Former Smoker  . Packs/day: 0.25  . Years: 2.00  . Pack years:  0.50  . Types: Cigarettes  . Quit date: 68  . Years since quitting: 51.2  Smokeless Tobacco Never Used   She has been exposed to passive smoke. The patient's alcohol use is:  Social History   Substance and Sexual Activity  Alcohol Use No   Additional information: Last pap hysterectomy  Past Medical History:  Diagnosis Date  . Anxiety   . Anxiety and depression   . Asthma   . Borderline diabetes   . Carpal tunnel syndrome 07/05/2007   Qualifier: Diagnosis of  By: [name], [name]    . Dyspnea on exertion 09/22/2018  . HIGH BLOOD PRESSURE 07/04/2007   Qualifier: Diagnosis of  By: [name], [name]    . HTN (hypertension)   . Hyperglycemia   . Hyperlipidemia   . IMPINGEMENT SYNDROME 03/09/2010   Qualifier: Diagnosis of  By: [name], [name]    . Obesity   . PSVT (paroxysmal supraventricular tachycardia) ([place]) 09/22/2018  . Pure hypercholesterolemia 09/22/2018  . Rapid palpitations   . Seasonal allergies      Family History  Problem Relation Age of Onset  . Pancreatic cancer Mother   . Esophageal cancer Maternal Grandmother   . Heart disease Maternal Grandfather   . Heart disease Maternal Uncle   . Colon cancer Maternal Aunt   . Stomach cancer Maternal Aunt   . Arthritis Other   . Cancer Other   . Diabetes Other   . Breast cancer Neg Hx  Current Outpatient Medications:  .  Albuterol Sulfate (PROAIR RESPICLICK) [id number] (90 Base) MCG/ACT AEPB, Inhale 2 puffs into the lungs every 4 (four) hours as needed (shortness of breath). , Disp: , Rfl:  .  Cholecalciferol (VITAMIN D3) 125 MCG (5000 UT) CAPS, Take 5,000 Units by mouth daily., Disp: , Rfl:  .  cyclobenzaprine (FLEXERIL) 5 MG tablet, Take 1 tablet (5 mg total) by mouth 2 (two) times daily., Disp: 60 tablet, Rfl: 1 .  diltiazem (CARDIZEM CD) 240 MG 24 hr capsule, Take 1 capsule (240 mg total) by mouth daily., Disp: 90 capsule, Rfl: 3 .  esomeprazole (NEXIUM) 20 MG capsule, Take 20 mg by mouth daily as needed  (heartburn). , Disp: , Rfl:  .  fluticasone (FLONASE) 50 MCG/ACT nasal spray, Place 1 spray into both nostrils daily as needed for allergies or rhinitis., Disp: , Rfl:  .  HYDROcodone-acetaminophen (NORCO/VICODIN) 5-325 MG tablet, Take 1 tablet by mouth every 6 (six) hours as needed for moderate pain., Disp: , Rfl:  .  ibuprofen (ADVIL) 800 MG tablet, Take 1 tablet (800 mg total) by mouth 3 (three) times daily with meals., Disp: 90 tablet, Rfl: 1 .  levocetirizine (XYZAL) 5 MG tablet, Take 5 mg by mouth daily. , Disp: , Rfl:  .  Probiotic Product (ALIGN PO), Take 1 capsule by mouth daily., Disp: , Rfl:  .  promethazine (PHENERGAN) 12.5 MG tablet, Take 1 tablet (12.5 mg total) by mouth every 6 (six) hours as needed for nausea or vomiting., Disp: 30 tablet, Rfl: 0 .  sertraline (ZOLOFT) 50 MG tablet, TAKE 1 TABLET BY MOUTH EVERY DAY, Disp: 90 tablet, Rfl: 1 .  Tetrahydrozoline HCl (VISINE OP), Place 1 drop into both eyes daily as needed (allergies). , Disp: , Rfl:  .  ZINC-VITAMIN C PO, Take 1 tablet by mouth daily., Disp: , Rfl:  .  acetaminophen (TYLENOL) 650 MG CR tablet, Take 1,300 mg by mouth every 8 (eight) hours as needed for pain., Disp: , Rfl:  .  rosuvastatin (CRESTOR) 10 MG tablet, TAKE ONE TABLET BY MOUTH DAILY (Patient not taking: Reported on 10/17/2019), Disp: 90 tablet, Rfl: 0   Allergies  Allergen Reactions  . Amoxicillin Other (See Comments)    Inflammation in pancreas.  Did it involve swelling of the face/tongue/throat, SOB, or low BP? No Did it involve sudden or severe rash/hives, skin peeling, or any reaction on the inside of your mouth or nose? No Did you need to seek medical attention at a hospital or doctor's office? Yes When did it last happen?5-6 years ago If all above answers are "NO", may proceed with cephalosporin use.      Review of Systems  Constitutional: Negative.   HENT: Negative.   Eyes: Negative.   Respiratory: Negative.   Cardiovascular: Negative.   Negative for chest pain, palpitations and leg swelling.  Gastrointestinal: Negative.   Endocrine: Negative.   Genitourinary: Negative.   Musculoskeletal:       Right shoulder pain  Skin: Negative.   Allergic/Immunologic: Negative.   Neurological: Negative.  Negative for dizziness and headaches.  Hematological: Negative.   Psychiatric/Behavioral: Negative.      Today's Vitals   10/17/19 1015  BP: 120/82  Temp: 98.3 F (36.8 C)  TempSrc: Oral  Weight: 187 lb 3.2 oz (84.9 kg)  Height: 5' 3.4" (1.61 m)  PainSc: 2   PainLoc: Shoulder   Body mass index is 32.74 kg/m.   Objective:  Physical Exam Constitutional:  General: She is not in acute distress.    Appearance: Normal appearance. She is well-developed.  HENT:     Head: Normocephalic and atraumatic.     Right Ear: Hearing, tympanic membrane, ear canal and external ear normal. There is no impacted cerumen.     Left Ear: Hearing, tympanic membrane, ear canal and external ear normal. There is no impacted cerumen.     Nose:     Comments: Deferred - mask    Mouth/Throat:     Comments: Deferred - mask  Eyes:     General: Lids are normal.     Conjunctiva/sclera: Conjunctivae normal.     Pupils: Pupils are equal, round, and reactive to light.     Funduscopic exam:    Right eye: No papilledema.        Left eye: No papilledema.  Neck:     Thyroid: No thyroid mass.     Vascular: No carotid bruit.  Cardiovascular:     Rate and Rhythm: Normal rate and regular rhythm.     Pulses: Normal pulses.     Heart sounds: Normal heart sounds. No murmur.  Pulmonary:     Effort: Pulmonary effort is normal.     Breath sounds: Normal breath sounds.  Abdominal:     General: Abdomen is flat. Bowel sounds are normal.     Palpations: Abdomen is soft.  Musculoskeletal:        General: No swelling. Normal range of motion.     Cervical back: Full passive range of motion without pain, normal range of motion and neck supple.     Right  lower leg: No edema.     Left lower leg: No edema.  Skin:    General: Skin is warm and dry.     Capillary Refill: Capillary refill takes less than 2 seconds.  Neurological:     General: No focal deficit present.     Mental Status: She is alert and oriented to person, place, and time.     Cranial Nerves: No cranial nerve deficit.     Sensory: No sensory deficit.  Psychiatric:        Mood and Affect: Mood normal.        Behavior: Behavior normal.        Thought Content: Thought content normal.        Judgment: Judgment normal.         Assessment And Plan:     1. Health maintenance examination . Behavior modifications discussed and diet history reviewed.   . Pt will continue to exercise regularly and modify diet with low GI, plant based foods and decrease intake of processed foods.  . Recommend intake of daily multivitamin, Vitamin D, and calcium.  . Recommend mammogram and colonoscopy for preventive screenings, as well as recommend immunizations that include influenza, TDAP  2. Essential hypertension . B/P is well controlled.  . CMP ordered to check renal function.  . The importance of regular exercise and dietary modification was stressed to the patient.  . Stressed importance of losing ten percent of her body weight to help with B/P control.  - POCT Urinalysis Dipstick (81002) - POCT UA - Microalbumin  3. Mixed hyperlipidemia  Chronic, controlled  Continue with current medications  4. Prediabetes  Chronic, stable  Continue with current medications  Encouraged to limit intake of sugary foods and drinks  Encouraged to increase physical activity to 150 minutes per week as tolerated  5. Anxiety  Chronic, she is  tolerating sertraline well  6. Fall, subsequent encounter  Reports a fall in 2019  7. Chronic right shoulder pain  Continues to have pain to her right shoulder after suffering a fall in 2019   She is having difficulty with performing activities and  difficulty with working when attempting to hold her arm up   Minette Brine, [place] DISTANC[employer] DUE TO THE COVID-19 [place].

## 2019-10-17 NOTE — Patient Instructions (Signed)
Health Maintenance  Topic Date Due  . INFLUENZA VACCINE  11/14/2019 (Originally 03/17/2019)  . MAMMOGRAM  10/19/2020  . PAP SMEAR-Modifier  09/15/2021  . TETANUS/TDAP  06/13/2022  . COLONOSCOPY  02/23/2028  . Hepatitis C Screening  Completed  . HIV Screening  Completed   Health Maintenance, Female Adopting a healthy lifestyle and getting preventive care are important in promoting health and wellness. Ask your health care provider about:  The right schedule for you to have regular tests and exams.  Things you can do on your own to prevent diseases and keep yourself healthy. What should I know about diet, weight, and exercise? Eat a healthy diet   Eat a diet that includes plenty of vegetables, fruits, low-fat dairy products, and lean protein.  Do not eat a lot of foods that are high in solid fats, added sugars, or sodium. Maintain a healthy weight Body mass index (BMI) is used to identify weight problems. It estimates body fat based on height and weight. Your health care provider can help determine your BMI and help you achieve or maintain a healthy weight. Get regular exercise Get regular exercise. This is one of the most important things you can do for your health. Most adults should:  Exercise for at least 150 minutes each week. The exercise should increase your heart rate and make you sweat (moderate-intensity exercise).  Do strengthening exercises at least twice a week. This is in addition to the moderate-intensity exercise.  Spend less time sitting. Even light physical activity can be beneficial. Watch cholesterol and blood lipids Have your blood tested for lipids and cholesterol at 62 years of age, then have this test every 5 years. Have your cholesterol levels checked more often if:  Your lipid or cholesterol levels are high.  You are older than 63 years of age.  You are at high risk for heart disease. What should I know about cancer screening? Depending on your health  history and family history, you may need to have cancer screening at various ages. This may include screening for:  Breast cancer.  Cervical cancer.  Colorectal cancer.  Skin cancer.  Lung cancer. What should I know about heart disease, diabetes, and high blood pressure? Blood pressure and heart disease  High blood pressure causes heart disease and increases the risk of stroke. This is more likely to develop in people who have high blood pressure readings, are of African descent, or are overweight.  Have your blood pressure checked: ? Every 3-5 years if you are 71-62 years of age. ? Every year if you are 64 years old or older. Diabetes Have regular diabetes screenings. This checks your fasting blood sugar level. Have the screening done:  Once every three years after age 29 if you are at a normal weight and have a low risk for diabetes.  More often and at a younger age if you are overweight or have a high risk for diabetes. What should I know about preventing infection? Hepatitis B If you have a higher risk for hepatitis B, you should be screened for this virus. Talk with your health care provider to find out if you are at risk for hepatitis B infection. Hepatitis C Testing is recommended for:  Everyone born from [birth year] through 1964.  Anyone with known risk factors for hepatitis C. Sexually transmitted infections (STIs)  Get screened for STIs, including gonorrhea and chlamydia, if: ? You are sexually active and are younger than 63 years of age. ? You are  older than 63 years of age and your health care provider tells you that you are at risk for this type of infection. ? Your sexual activity has changed since you were last screened, and you are at increased risk for chlamydia or gonorrhea. Ask your health care provider if you are at risk.  Ask your health care provider about whether you are at high risk for HIV. Your health care provider may recommend a prescription medicine to  help prevent HIV infection. If you choose to take medicine to prevent HIV, you should first get tested for HIV. You should then be tested every 3 months for as long as you are taking the medicine. Pregnancy  If you are about to stop having your period (premenopausal) and you may become pregnant, seek counseling before you get pregnant.  Take 400 to 800 micrograms (mcg) of folic acid every day if you become pregnant.  Ask for birth control (contraception) if you want to prevent pregnancy. Osteoporosis and menopause Osteoporosis is a disease in which the bones lose minerals and strength with aging. This can result in bone fractures. If you are 34 years old or older, or if you are at risk for osteoporosis and fractures, ask your health care provider if you should:  Be screened for bone loss.  Take a calcium or vitamin D supplement to lower your risk of fractures.  Be given hormone replacement therapy (HRT) to treat symptoms of menopause. Follow these instructions at home: Lifestyle  Do not use any products that contain nicotine or tobacco, such as cigarettes, e-cigarettes, and chewing tobacco. If you need help quitting, ask your health care provider.  Do not use street drugs.  Do not share needles.  Ask your health care provider for help if you need support or information about quitting drugs. Alcohol use  Do not drink alcohol if: ? Your health care provider tells you not to drink. ? You are pregnant, may be pregnant, or are planning to become pregnant.  If you drink alcohol: ? Limit how much you use to 0-1 drink a day. ? Limit intake if you are breastfeeding.  Be aware of how much alcohol is in your drink. In the [place] one drink equals one 12 oz bottle of beer (355 mL), one 5 oz glass of wine (148 mL), or one 1 oz glass of hard liquor (44 mL). General instructions  Schedule regular health, dental, and eye exams.  Stay current with your vaccines.  Tell your health care  provider if: ? You often feel depressed. ? You have ever been abused or do not feel safe at home. Summary  Adopting a healthy lifestyle and getting preventive care are important in promoting health and wellness.  Follow your health care provider's instructions about healthy diet, exercising, and getting tested or screened for diseases.  Follow your health care provider's instructions on monitoring your cholesterol and blood pressure. This information is not intended to replace advice given to you by your health care provider. Make sure you discuss any questions you have with your health care provider. Document Revised: 07/26/2018 Document Reviewed: 07/26/2018 Elsevier Patient Education  2020 [employer].

## 2019-10-18 LAB — CMP14+EGFR
ALT: 23 IU/L (ref 0–32)
AST: 17 IU/L (ref 0–40)
Albumin/Globulin Ratio: 1.6 (ref 1.2–2.2)
Albumin: 4.5 g/dL (ref 3.8–4.8)
Alkaline Phosphatase: 144 IU/L — ABNORMAL HIGH (ref 39–117)
BUN/Creatinine Ratio: 18 (ref 12–28)
BUN: 15 mg/dL (ref 8–27)
Bilirubin Total: 0.3 mg/dL (ref 0.0–1.2)
CO2: 23 mmol/L (ref 20–29)
Calcium: 9.5 mg/dL (ref 8.7–10.3)
Chloride: 105 mmol/L (ref 96–106)
Creatinine, Ser: 0.85 mg/dL (ref 0.57–1.00)
GFR calc Af Amer: 85 mL/min/{1.73_m2} (ref >59)
GFR calc non Af Amer: 74 mL/min/{1.73_m2} (ref >59)
Globulin, Total: 2.8 g/dL (ref 1.5–4.5)
Glucose: 106 mg/dL — ABNORMAL HIGH (ref 65–99)
Potassium: 4.1 mmol/L (ref 3.5–5.2)
Sodium: 145 mmol/L — ABNORMAL HIGH (ref 134–144)
Total Protein: 7.3 g/dL (ref 6.0–8.5)

## 2019-10-18 LAB — CBC
Hematocrit: 45.1 % (ref 34.0–46.6)
Hemoglobin: 14.9 g/dL (ref 11.1–15.9)
MCH: 29.5 pg (ref 26.6–33.0)
MCHC: 33 g/dL (ref 31.5–35.7)
MCV: 89 fL (ref 79–97)
Platelets: 212 10*3/uL (ref 150–450)
RBC: 5.05 x10E6/uL (ref 3.77–5.28)
RDW: 13 % (ref 11.7–15.4)
WBC: 6.7 10*3/uL (ref 3.4–10.8)

## 2019-10-18 LAB — HEMOGLOBIN A1C
Est. average glucose Bld gHb Est-mCnc: 126 mg/dL
Hgb A1c MFr Bld: 6 % — ABNORMAL HIGH (ref 4.8–5.6)

## 2019-10-18 LAB — LIPID PANEL
Chol/HDL Ratio: 2.6 ratio (ref 0.0–4.4)
Cholesterol, Total: 173 mg/dL (ref 100–199)
HDL: 67 mg/dL (ref >39)
LDL Chol Calc (NIH): 92 mg/dL (ref 0–99)
Triglycerides: 75 mg/dL (ref 0–149)
VLDL Cholesterol Cal: 14 mg/dL (ref 5–40)

## 2019-10-24 ENCOUNTER — Other Ambulatory Visit: Payer: Self-pay

## 2019-10-24 ENCOUNTER — Ambulatory Visit (HOSPITAL_COMMUNITY)
Admission: RE | Admit: 2019-10-24 | Discharge: 2019-10-24 | Disposition: A | Discharge status: Home / Self Care | Payer: BC Managed Care – PPO | Source: Ambulatory Visit | Attending: Orthopedic Surgery | Admitting: Orthopedic Surgery

## 2019-10-24 ENCOUNTER — Ambulatory Visit
Admission: RE | Admit: 2019-10-24 | Discharge: 2019-10-24 | Disposition: A | Discharge status: Home / Self Care | Payer: BC Managed Care – PPO | Source: Ambulatory Visit | Attending: Obstetrics and Gynecology | Admitting: Obstetrics and Gynecology

## 2019-10-24 DIAGNOSIS — G8929 Other chronic pain: Secondary | ICD-10-CM

## 2019-10-24 DIAGNOSIS — M25511 Pain in right shoulder: Secondary | ICD-10-CM | POA: Insufficient documentation

## 2019-10-24 DIAGNOSIS — Z1231 Encounter for screening mammogram for malignant neoplasm of breast: Secondary | ICD-10-CM | POA: Diagnosis not present

## 2019-10-24 LAB — HM MAMMOGRAPHY: HM Mammogram: NORMAL (ref 0–4)

## 2019-10-25 ENCOUNTER — Ambulatory Visit: Payer: BC Managed Care – PPO | Attending: Family

## 2019-10-25 DIAGNOSIS — Z23 Encounter for immunization: Secondary | ICD-10-CM

## 2019-10-25 NOTE — Progress Notes (Signed)
   Covid-19 Vaccination Clinic  Name:  Kathryn Gallegos    MRN: [id number] DOB: [birth year]  10/25/2019  Kathryn Gallegos was observed post Covid-19 immunization for 15 minutes without incident. She was provided with Vaccine Information Sheet and instruction to access the V-Safe system.   Kathryn Gallegos was instructed to call 911 with any severe reactions post vaccine: Marland Kitchen Difficulty breathing  . Swelling of face and throat  . A fast heartbeat  . A bad rash all over body  . Dizziness and weakness   Immunizations Administered    Name Date Dose VIS Date Route   Moderna COVID-19 Vaccine 10/25/2019  4:23 PM 0.5 mL 07/17/2019 Intramuscular   Manufacturer: Moderna   Lot: [id number]   [id number]

## 2019-10-29 ENCOUNTER — Ambulatory Visit: Payer: BC Managed Care – PPO | Admitting: Orthopedic Surgery

## 2019-10-31 ENCOUNTER — Other Ambulatory Visit: Payer: Self-pay

## 2019-10-31 ENCOUNTER — Ambulatory Visit: Payer: BC Managed Care – PPO | Admitting: Orthopedic Surgery

## 2019-10-31 VITALS — BP 119/84 | HR 72 | Temp 97.1°F | Ht 64.0 in | Wt 181.0 lb

## 2019-10-31 DIAGNOSIS — G8929 Other chronic pain: Secondary | ICD-10-CM | POA: Diagnosis not present

## 2019-10-31 DIAGNOSIS — M25511 Pain in right shoulder: Secondary | ICD-10-CM | POA: Diagnosis not present

## 2019-10-31 NOTE — Progress Notes (Signed)
Chief Complaint  Patient presents with  . Follow-up    Recheck right shoulder    MRI f/U I reviewed the MRI it looks like she has healed one of the tendons still has a partial less than 50% tear supraspinatus she had the biceps tenotomy  No other acute processes are going on  Still complains of pain anterior right shoulder did well with injection in the past agrees to repeat injection  Procedure: Biceps tendon injection Right biceps tendon was injected The patient gave verbal consent for cortisone injection Timeout confirmed the site of injection Medications used included 40 mg of Depo-Medrol and 3 mL 1% lidocaine After alcohol and ethyl chloride preparation the point of maximal tenderness was injected over the right biceps tendon there were no complications  Encounter Diagnosis  Name Primary?  . Chronic right shoulder pain Yes    Follow-up in 6 weeks

## 2019-10-31 NOTE — Progress Notes (Signed)
Chief Complaint  Patient presents with  . Follow-up    Recheck right shoulder   Personal review of MRI  MRI was done of the right shoulder.  It shows one of the tendons looks more healed still has the partial supraspinatus tendon tear of the tenotomy is noted  Still has pain front of the shoulder did well with an injection in the bicipital groove  Bicipital groove injection repeated  Procedure: Biceps tendon injection Right biceps tendon was injected The patient gave verbal consent for cortisone injection Timeout confirmed the site of injection Medications used included 40 mg of Depo-Medrol and 3 mL 1% lidocaine After alcohol and ethyl chloride preparation the point of maximal tenderness was injected over the right biceps tendon there were no complications   Encounter Diagnosis  Name Primary?  . Chronic right shoulder pain Yes   Follow-up in 6 weeks

## 2019-11-01 ENCOUNTER — Telehealth: Payer: Self-pay | Admitting: Orthopedic Surgery

## 2019-11-01 NOTE — Telephone Encounter (Signed)
Patient came back to office today with the renewal form for her "Total and permanent disability parking placard" / 5 year renewal form. She states what she received at time of visit yesterday, 10/31/19, is a 6 month placard. 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 Asking if Dr Aline Brochure can complete the permanent disability 5 year form.  (form in Dr's box)

## 2019-11-05 ENCOUNTER — Encounter: Payer: Self-pay | Admitting: Orthopedic Surgery

## 2019-11-21 DIAGNOSIS — Z6831 Body mass index (BMI) 31.0-31.9, adult: Secondary | ICD-10-CM | POA: Diagnosis not present

## 2019-11-21 DIAGNOSIS — Z01419 Encounter for gynecological examination (general) (routine) without abnormal findings: Secondary | ICD-10-CM | POA: Diagnosis not present

## 2019-11-21 DIAGNOSIS — B372 Candidiasis of skin and nail: Secondary | ICD-10-CM | POA: Diagnosis not present

## 2019-11-27 ENCOUNTER — Ambulatory Visit: Payer: BC Managed Care – PPO | Attending: Family

## 2019-11-27 DIAGNOSIS — Z23 Encounter for immunization: Secondary | ICD-10-CM

## 2019-11-27 NOTE — Progress Notes (Signed)
   Covid-19 Vaccination Clinic  Name:  Kathryn Gallegos    MRN: [id number] DOB: 1955/10/22  11/27/2019  [name] was observed post Covid-19 immunization for 15 minutes without incident. She was provided with Vaccine Information Sheet and instruction to access the V-Safe system.   [name] was instructed to call 911 with any severe reactions post vaccine: Marland Kitchen Difficulty breathing  . Swelling of face and throat  . A fast heartbeat  . A bad rash all over body  . Dizziness and weakness   Immunizations Administered    Name Date Dose VIS Date Route   Moderna COVID-19 Vaccine 11/27/2019  4:25 PM 0.5 mL 07/17/2019 Intramuscular   Manufacturer: Moderna   Lot: [id number]   [id number]

## 2019-12-12 ENCOUNTER — Ambulatory Visit (INDEPENDENT_AMBULATORY_CARE_PROVIDER_SITE_OTHER): Payer: BC Managed Care – PPO | Admitting: Orthopedic Surgery

## 2019-12-12 ENCOUNTER — Other Ambulatory Visit: Payer: Self-pay

## 2019-12-12 VITALS — Temp 97.7°F | Ht 64.0 in | Wt 181.0 lb

## 2019-12-12 DIAGNOSIS — M25511 Pain in right shoulder: Secondary | ICD-10-CM

## 2019-12-12 DIAGNOSIS — M47812 Spondylosis without myelopathy or radiculopathy, cervical region: Secondary | ICD-10-CM | POA: Diagnosis not present

## 2019-12-12 DIAGNOSIS — M7521 Bicipital tendinitis, right shoulder: Secondary | ICD-10-CM

## 2019-12-12 DIAGNOSIS — G8929 Other chronic pain: Secondary | ICD-10-CM

## 2019-12-12 DIAGNOSIS — Z9889 Other specified postprocedural states: Secondary | ICD-10-CM | POA: Diagnosis not present

## 2019-12-12 MED ORDER — CYCLOBENZAPRINE HCL 5 MG PO TABS: 5.0000 mg | ORAL_TABLET | Freq: Two times a day (BID) | ORAL | 1 refills | Status: DC

## 2019-12-12 NOTE — Patient Instructions (Signed)
Consult for neck and arm pain

## 2019-12-12 NOTE — Progress Notes (Signed)
C/o right arm pain and aching of the arm and elbow with neck pain   Still has pain in the chest and biceps tendon proximally   Kathryn Gallegos still has neck pain fatigue in her right arm after riding for about 45 minutes her biceps tendon is still inflamed she has decreased range of motion in her shoulder  I am concerned somewhat about her cervical spine disc disease and would like her to see someone about it she has requested Kathryn Gallegos who is familiar to her through her friend  Today we find tenderness no swelling tenderness is over the biceps tendon there is some evidence of Popeye deformity of the biceps muscle  She has pain running down the neck down the back of her arm to the elbow  She has stiffness in the right shoulder  Encounter Diagnoses  Name Primary?  . S/P arthroscopy of right shoulder 01/09/19   . Chronic right shoulder pain Yes  . Biceps tendinitis of right upper extremity   . Cervical spondylosis without myelopathy    Referral to Kathryn Gallegos  Follow-up in a month  Prescription  Meds ordered this encounter  Medications  . cyclobenzaprine (FLEXERIL) 5 MG tablet    Sig: Take 1 tablet (5 mg total) by mouth 2 (two) times daily.    Dispense:  60 tablet    Refill:  1     CLINICAL DATA:  Right shoulder and arm pain for the past year.   EXAM: MRI CERVICAL SPINE WITHOUT CONTRAST   TECHNIQUE: Multiplanar, multisequence MR imaging of the cervical spine was performed. No intravenous contrast was administered.   COMPARISON:  Cervical spine x-rays dated February 01, 2018. MRI cervical spine dated August 04, 2005.   FINDINGS: Alignment: Straightening of the normal cervical lordosis. Sagittal alignment is maintained.   Vertebrae: No fracture, evidence of discitis, or bone lesion.   Cord: Normal signal and morphology.   Posterior Fossa, vertebral arteries, paraspinal tissues: Negative.   Disc levels:   C2-C3: Negative disc. New moderate left facet arthropathy. New mild to  moderate left neuroforaminal stenosis. No spinal canal or right neuroforaminal stenosis.   C3-C4: No significant disc bulge or herniation. New mild bilateral facet arthropathy. No stenosis.   C4-C5: Mild disc degeneration without significant disc bulge or herniation. New moderate right and mild left facet arthropathy. New mild right greater than left neuroforaminal stenosis. No spinal canal stenosis.   C5-C6: Mild disc degeneration without significant disc bulge or herniation. Unchanged mild left uncovertebral hypertrophy and mild bilateral facet arthropathy. Unchanged mild left neuroforaminal stenosis. No spinal canal or right neuroforaminal stenosis.   C6-C7: Negative disc. Unchanged mild bilateral facet arthropathy. No stenosis.   C7-T1: Negative disc. Unchanged mild bilateral facet arthropathy. No stenosis.   IMPRESSION: 1. New mild-to-moderate left neuroforaminal stenosis at C2-C3 and mild right greater than left neuroforaminal stenosis at C4-C5 due to progressive facet arthropathy. 2. Unchanged mild left neuroforaminal stenosis at C5-C6 due to facet uncovertebral hypertrophy.     Electronically Signed   By: Kathryn Gallegos M.D.   On: 05/30/2019 10:30   Meds ordered this encounter  Medications  . cyclobenzaprine (FLEXERIL) 5 MG tablet    Sig: Take 1 tablet (5 mg total) by mouth 2 (two) times daily.    Dispense:  60 tablet    Refill:  1

## 2019-12-24 ENCOUNTER — Encounter: Payer: Self-pay | Admitting: Physical Medicine and Rehabilitation

## 2019-12-24 DIAGNOSIS — M7918 Myalgia, other site: Secondary | ICD-10-CM

## 2019-12-24 DIAGNOSIS — M542 Cervicalgia: Secondary | ICD-10-CM

## 2019-12-24 MED ORDER — TRIAMCINOLONE ACETONIDE 40 MG/ML IJ SUSP
20.0000 mg | INTRAMUSCULAR | Status: AC | PRN
  Administered 2019-12-24: 20 mg via INTRAMUSCULAR

## 2019-12-24 MED ORDER — LIDOCAINE HCL 1 % IJ SOLN
3.0000 mL | INTRAMUSCULAR | Status: AC | PRN
  Administered 2019-12-24: 3 mL

## 2019-12-24 NOTE — Progress Notes (Signed)
Kathryn Gallegos - 64 y.o. female MRN [id number]  Date of birth: 08/07/1956  Office Visit Note: Visit Date: 09/04/2019 PCP: Minette Brine, FNP Referred by: Minette Brine, FNP  Subjective: Chief Complaint  Patient presents with  . Neck - Pain  . Right Shoulder - Pain  . Right Arm - Pain, Numbness   HPI: Kathryn Gallegos is a 64 y.o. female who comes in today At the request of Dr. Arther Abbott for evaluation management of chronic worsening neck pain and right shoulder pain.  He has been following her for an orthopedic standpoint and found that she does have some issues that he feels like is emanating from the shoulder but also felt like a lot of this was related potentially to the cervical spine.  She did have procedure for rotator cuff performed by Dr. Aline Brochure in May 2020.  Her symptoms are 10 out of 10 pain despite the use of medication and prior history of therapy.  She has pain in the top of the right shoulder radiating to the elbow with difficulty in lifting the right arm.  She does have mechanical shoulder pain with lifting the right arm.  She has pain numbness and tingling in a nondermatomal fashion into the hand and fingers.  Again this is a constant dull and aching pain worse with lying down at night.  Does seem to be improved with some medication temporarily.  MRI of the cervical spine was performed and this is reviewed with the patient and reviewed in detail with images and spine models.  The patient was very concerned about a bulging disc that she was told that she had.  I did talk to her at great length today probably more than 30 minutes about pathology in the cervical spine as well as the fact that we do still have a pain test that we can scan somebody with and get a idea of exactly where the pain is coming from.  Her MRI was significant and really what it did not show which is no central stenosis no disc herniation.  There was facet arthropathy more on the left at C2-3 and more  on the right at C4-5.  She could be getting some irritation of the nerve at C4-5 which could give more of a C5 or C6 radicular symptoms into the arm.  She has a history of carpal tunnel syndrome.  She does not have any electrodiagnostic studies to review in the chart that I can tell.  Review of Systems  Musculoskeletal: Positive for joint pain and neck pain.  All other systems reviewed and are negative.  Otherwise per HPI.  Assessment & Plan: Visit Diagnoses:  1. Chronic right shoulder pain   2. Paresthesia of skin   3. Cervicalgia   4. Myofascial pain syndrome     Plan: Findings:  Chronic worsening severe 10 out of 10 pain which is in the neck posterior shoulder and down the arm.  She still has some mechanical complaints of the shoulder with movement.  She does have pain with paresthesia into the arm but really not much in the way of MRI findings of frank nerve compression but may be could be some irritation at C4-5 just from the fact that there is some arthritis there that is moderate.  No foraminal stenosis no central stenosis.  She does have exam consistent with myofascial pain with trigger points.  Did complete trigger point injection today.  Depending on relief could look at facet joint block  versus cervical epidural injection.  Should continue to follow with Dr. Arther Abbott concerning the mechanical shoulder complaints.  If the trigger point injections are beneficial may benefit from dry needling with a physical therapist.    Meds & Orders: No orders of the defined types were placed in this encounter.   Orders Placed This Encounter  Procedures  . Trigger Point Inj    Follow-up: Return if symptoms worsen or fail to improve, for Consider cervical epidural injection.   Procedures: Trigger Point Inj  Date/Time: 12/24/2019 5:58 AM Performed by: Magnus Sinning, MD Authorized by: Magnus Sinning, MD   Consent Given by:  Patient Site marked: the procedure site was marked     Timeout: prior to procedure the correct patient, procedure, and site was verified   Total # of Trigger Points:  3 or more Location: neck and back   Needle Size:  25 G Approach:  Dorsal Medications #1:  20 mg triamcinolone acetonide 40 MG/ML Medications #2:  3 mL lidocaine 1 % Additional Injections?: No   Patient tolerance:  Patient tolerated the procedure well with no immediate complications Comments: Trigger points palpated and injected along the levator scapula and trapezius and supraspinatus    No notes on file   Clinical History: MRI CERVICAL SPINE WITHOUT CONTRAST  TECHNIQUE: Multiplanar, multisequence MR imaging of the cervical spine was performed. No intravenous contrast was administered.  COMPARISON:  Cervical spine x-rays dated February 01, 2018. MRI cervical spine dated August 04, 2005.  FINDINGS: Alignment: Straightening of the normal cervical lordosis. Sagittal alignment is maintained.  Vertebrae: No fracture, evidence of discitis, or bone lesion.  Cord: Normal signal and morphology.  Posterior Fossa, vertebral arteries, paraspinal tissues: Negative.  Disc levels:  C2-C3: Negative disc. New moderate left facet arthropathy. New mild to moderate left neuroforaminal stenosis. No spinal canal or right neuroforaminal stenosis.  C3-C4: No significant disc bulge or herniation. New mild bilateral facet arthropathy. No stenosis.  C4-C5: Mild disc degeneration without significant disc bulge or herniation. New moderate right and mild left facet arthropathy. New mild right greater than left neuroforaminal stenosis. No spinal canal stenosis.  C5-C6: Mild disc degeneration without significant disc bulge or herniation. Unchanged mild left uncovertebral hypertrophy and mild bilateral facet arthropathy. Unchanged mild left neuroforaminal stenosis. No spinal canal or right neuroforaminal stenosis.  C6-C7: Negative disc. Unchanged mild bilateral facet  arthropathy. No stenosis.  C7-T1: Negative disc. Unchanged mild bilateral facet arthropathy. No stenosis.  IMPRESSION: 1. New mild-to-moderate left neuroforaminal stenosis at C2-C3 and mild right greater than left neuroforaminal stenosis at C4-C5 due to progressive facet arthropathy. 2. Unchanged mild left neuroforaminal stenosis at C5-C6 due to facet uncovertebral hypertrophy.   Electronically Signed   By: Titus Dubin M.D.   On: 05/30/2019 10:30   She reports that she quit smoking about 51 years ago. Her smoking use included cigarettes. She has a 0.50 pack-year smoking history. She has never used smokeless tobacco.  Recent Labs    10/17/19 1130  HGBA1C 6.0*    Objective:  VS:  HT:    WT:   BMI:     BP:127/88  HR:73bpm  TEMP: ( )  RESP:  Physical Exam Vitals and nursing note reviewed.  Constitutional:      General: She is not in acute distress.    Appearance: Normal appearance. She is not ill-appearing.  HENT:     Head: Normocephalic and atraumatic.     Right Ear: External ear normal.     Left  Ear: External ear normal.  Eyes:     Extraocular Movements: Extraocular movements intact.  Cardiovascular:     Rate and Rhythm: Normal rate.     Pulses: Normal pulses.  Musculoskeletal:     Cervical back: Tenderness present. No rigidity.     Right lower leg: No edema.     Left lower leg: No edema.     Comments: Patient has good strength in the upper extremities including 5 out of 5 strength in wrist extension long finger flexion and APB.  There is no atrophy of the hands intrinsically.  There is a negative Hoffmann's test.  She does have pain with shoulder abduction with negative drop arm test.  She does have focal trigger points in the levator scapula and trapezius that do reproduce a lot of her pain.   Lymphadenopathy:     Cervical: No cervical adenopathy.  Skin:    Findings: No erythema, lesion or rash.  Neurological:     General: No focal deficit present.      Mental Status: She is alert and oriented to person, place, and time.     Sensory: No sensory deficit.     Motor: No weakness or abnormal muscle tone.     Coordination: Coordination normal.  Psychiatric:        Mood and Affect: Mood normal.        Behavior: Behavior normal.     Ortho Exam  Imaging: No results found.  Past Medical/Family/Surgical/Social History: Medications & Allergies reviewed per EMR, new medications updated. Patient Active Problem List   Diagnosis Date Noted  . S/P arthroscopy of right shoulder 01/09/19 01/11/2019  . Rupture of right proximal biceps tendon   . Traumatic incomplete tear of right rotator cuff 11/29/2018  . Essential hypertension 10/11/2018  . Prediabetes 10/11/2018  . Anxiety 10/11/2018  . Laboratory examination 10/11/2018  . Dyspnea on exertion 09/22/2018  . Pure hypercholesterolemia 09/22/2018  . Globus sensation 08/16/2018  . Asthma, chronic, mild persistent, uncomplicated [id number]  . Supraventricular tachycardia, paroxysmal ([place]) 05/07/2017  . IMPINGEMENT SYNDROME 03/09/2010  . CARPAL TUNNEL SYNDROME 07/05/2007   Past Medical History:  Diagnosis Date  . Anxiety   . Anxiety and depression   . Asthma   . Borderline diabetes   . Carpal tunnel syndrome 07/05/2007   Qualifier: Diagnosis of  By: [name], [name]    . Dyspnea on exertion 09/22/2018  . HIGH BLOOD PRESSURE 07/04/2007   Qualifier: Diagnosis of  By: [name], [name]    . HTN (hypertension)   . Hyperglycemia   . Hyperlipidemia   . IMPINGEMENT SYNDROME 03/09/2010   Qualifier: Diagnosis of  By: [name], [name]    . Obesity   . PSVT (paroxysmal supraventricular tachycardia) ([place]) 09/22/2018  . Pure hypercholesterolemia 09/22/2018  . Rapid palpitations   . Seasonal allergies    Family History  Problem Relation Age of Onset  . Pancreatic cancer Mother   . Esophageal cancer Maternal Grandmother   . Heart disease Maternal Grandfather   . Heart disease Maternal  Uncle   . Colon cancer Maternal Aunt   . Stomach cancer Maternal Aunt   . Arthritis Other   . Cancer Other   . Diabetes Other   . Breast cancer Neg Hx    Past Surgical History:  Procedure Laterality Date  . BREAST BIOPSY Left   . SHOULDER ARTHROSCOPY WITH BICEPSTENOTOMY Right 01/09/2019   Procedure: SHOULDER ARTHROSCOPY WITH biceps tenotomy, limited debriedment;  Surgeon: [name], MD;  Location: [facility];  Service: Orthopedics;  Laterality: Right;  . TOTAL ABDOMINAL HYSTERECTOMY    . TUBAL LIGATION     Social History   Occupational History  . Occupation: Psychiatric nurse: [employer]  Tobacco Use  . Smoking status: Former Smoker    Packs/day: 0.25    Years: 2.00    Pack years: 0.50    Types: Cigarettes    Quit date: 1970    Years since quitting: 51.3  . Smokeless tobacco: Never Used  Substance and Sexual Activity  . Alcohol use: No  . Drug use: No  . Sexual activity: Yes    Birth control/protection: Surgical    Comment: Beatrix Shipper [place]

## 2020-01-07 ENCOUNTER — Encounter: Payer: Self-pay | Admitting: Orthopedic Surgery

## 2020-01-11 DIAGNOSIS — D259 Leiomyoma of uterus, unspecified: Secondary | ICD-10-CM | POA: Insufficient documentation

## 2020-01-11 DIAGNOSIS — L2389 Allergic contact dermatitis due to other agents: Secondary | ICD-10-CM | POA: Diagnosis not present

## 2020-01-11 DIAGNOSIS — E669 Obesity, unspecified: Secondary | ICD-10-CM | POA: Insufficient documentation

## 2020-01-11 DIAGNOSIS — T63481A Toxic effect of venom of other arthropod, accidental (unintentional), initial encounter: Secondary | ICD-10-CM | POA: Diagnosis not present

## 2020-01-24 ENCOUNTER — Encounter: Payer: Self-pay | Admitting: Orthopedic Surgery

## 2020-01-24 ENCOUNTER — Ambulatory Visit (INDEPENDENT_AMBULATORY_CARE_PROVIDER_SITE_OTHER): Payer: BC Managed Care – PPO | Admitting: Orthopedic Surgery

## 2020-01-24 ENCOUNTER — Other Ambulatory Visit: Payer: Self-pay

## 2020-01-24 VITALS — BP 140/97 | HR 97 | Ht 64.0 in | Wt 185.0 lb

## 2020-01-24 DIAGNOSIS — M47812 Spondylosis without myelopathy or radiculopathy, cervical region: Secondary | ICD-10-CM | POA: Diagnosis not present

## 2020-01-24 DIAGNOSIS — S46011D Strain of muscle(s) and tendon(s) of the rotator cuff of right shoulder, subsequent encounter: Secondary | ICD-10-CM

## 2020-01-24 DIAGNOSIS — G8929 Other chronic pain: Secondary | ICD-10-CM

## 2020-01-24 DIAGNOSIS — M7521 Bicipital tendinitis, right shoulder: Secondary | ICD-10-CM | POA: Diagnosis not present

## 2020-01-24 DIAGNOSIS — Z9889 Other specified postprocedural states: Secondary | ICD-10-CM

## 2020-01-24 NOTE — Progress Notes (Signed)
Encounter Diagnoses  Name Primary?  . S/P arthroscopy of right shoulder 01/09/19 Yes  . Chronic right shoulder pain   . Biceps tendinitis of right upper extremity   . Cervical spondylosis without myelopathy   . Traumatic incomplete tear of right rotator cuff, subsequent encounter     Chief Complaint  Patient presents with  . Shoulder Pain    R/ hurting for the last 2 weeks    Kathryn Gallegos 64 years old she has had surgery on the right shoulder had a biceps tenotomy arthroscopic debridement and last year in May presents with acute onset of increasing pain right shoulder for 2 weeks.  Pain is located over the lateral deltoid and associated with stiffness loss of motion she denies trauma she does not have neck pain associated with this although has chronic neck pain  Exam shows external rotation of 50 degrees with arm at her side but her active and passive abduction is less than 90 extension is 30 degrees cannot internally rotate to the back pocket in the flexion arc is also less than 90 degrees she has tenderness around the shoulder no warmth erythema is noted  Neurovascular exam is intact  Acute bursitis  Recommend subacromial injection and Codman exercises follow-up in 3 weeks   Procedure note the subacromial injection shoulder RIGHT    Verbal consent was obtained to inject the  RIGHT   Shoulder  Timeout was completed to confirm the injection site is a subacromial space of the  RIGHT  shoulder   Medication used Depo-Medrol 40 mg and lidocaine 1% 3 cc  Anesthesia was provided by ethyl chloride  The injection was performed in the RIGHT  posterior subacromial space. After pinning the skin with alcohol and anesthetized the skin with ethyl chloride the subacromial space was injected using a 20-gauge needle. There were no complications  Sterile dressing was applied.

## 2020-01-24 NOTE — Patient Instructions (Addendum)
Ice today tomorrow then heating pad/ do exercises Neiva Maenza gave you   Heat pad as needed   Joint Steroid Injection A joint steroid injection is a procedure to relieve swelling and pain in a joint. Steroids are medicines that reduce inflammation. In this procedure, your health care provider uses a syringe and a needle to inject a steroid medicine into a painful and inflamed joint. A pain-relieving medicine (anesthetic) may be injected along with the steroid. In some cases, your health care provider may use an imaging technique such as ultrasound or fluoroscopy to guide the injection. Joints that are often treated with steroid injections include the knee, shoulder, hip, and spine. These injections may also be used in the elbow, ankle, and joints of the hands or feet. You may have joint steroid injections as part of your treatment for inflammation caused by:  Gout.  Rheumatoid arthritis.  Advanced wear-and-tear arthritis (osteoarthritis).  Tendinitis.  Bursitis. Joint steroid injections may be repeated, but having them too often can damage a joint or the skin over the joint. You should not have joint steroid injections less than 6 weeks apart or more than four times a year. Tell a health care provider about:  Any allergies you have.  All medicines you are taking, including vitamins, herbs, eye drops, creams, and over-the-counter medicines.  Any problems you or family members have had with anesthetic medicines.  Any blood disorders you have.  Any surgeries you have had.  Any medical conditions you have.  Whether you are pregnant or may be pregnant. What are the risks? Generally, this is a safe treatment. However, problems may occur, including:  Infection.  Bleeding.  Allergic reactions to medicines.  Damage to the joint or tissues around the joint.  Thinning of skin or loss of skin color over the joint.  Temporary flushing of the face or chest.  Temporary increase in  pain.  Temporary increase in blood sugar.  Failure to relieve inflammation or pain. What happens before the treatment?  You may have imaging tests of your joint.  Ask your health care provider about: ? Changing or stopping your regular medicines. This is especially important if you are taking diabetes medicines or blood thinners. ? Taking medicines such as aspirin and ibuprofen. These medicines can thin your blood. Do not take these medicines unless your health care provider tells you to take them. ? Taking over-the-counter medicines, vitamins, herbs, and supplements.  Ask your health care provider if you can drive yourself home after the procedure. What happens during the treatment?   Your health care provider will position you for the injection and locate the injection site over your joint.  The skin over the joint will be cleaned with a germ-killing soap.  Your health care provider may: ? Spray a numbing solution (topical anesthetic) over the injection site. ? Inject a local anesthetic under the skin above your joint.  The needle will be placed through your skin into your joint. Your health care provider may use imaging to guide the needle to the right spot for the injection. If imaging is used, a special contrast dye may be injected to confirm that the needle is in the correct location.  The steroid medicine will be injected into your joint.  Anesthetic may be injected along with the steroid. This may be a medicine that relieves pain for a short time (short-acting anesthetic) or for a longer time (long-acting anesthetic).  The needle will be removed, and an adhesive bandage (dressing) will  be placed over the injection site. The procedure may vary among health care providers and hospitals. What can I expect after the treatment?  You will be able to go home after the treatment.  It is normal to feel slight flushing for a few days after the injection.  After the treatment, it  is common to have an increase in joint pain after the anesthetic has worn off. This may happen about an hour after a short-acting anesthetic or about 8 hours after a longer-acting anesthetic.  You should begin to feel relief from joint pain and swelling after 24 to 48 hours. Follow these instructions at home: Injection site care  Leave the adhesive dressing over your injection site in place until your health care provider says you can remove it.  Check your injection site every day for signs of infection. Check for: ? Redness, swelling, or pain. ? Fluid or blood. ? Warmth. ? Pus or a bad smell. Activity  Return to your normal activities as told by your health care provider. Ask your health care provider what activities are safe for you. You may be asked to limit activities that put stress on the joint for a few days.  Do joint exercises as told by your health care provider.  Do not take baths, swim, or use a hot tub until your health care provider approves. Managing pain, stiffness, and swelling   If directed, put ice on the joint. ? Put ice in a plastic bag. ? Place a towel between your skin and the bag. ? Leave the ice on for 20 minutes, 2-3 times a day.  Raise (elevate) your joint above the level of your heart when you are sitting or lying down. General instructions  Take over-the-counter and prescription medicines only as told by your health care provider.  Do not use any products that contain nicotine or tobacco, such as cigarettes, e-cigarettes, and chewing tobacco. These can delay joint healing. If you need help quitting, ask your health care provider.  If you have diabetes, be aware that your blood sugar may be slightly elevated for several days after the injection.  Keep all follow-up visits as told by your health care provider. This is important. Contact a health care provider if you have:  Chills or a fever.  Any signs of infection at your injection  site.  Increased pain or swelling or no relief after 2 days. Summary  A joint steroid injection is a treatment to relieve pain and swelling in a joint.  Steroids are medicines that reduce inflammation. Your health care provider may add an anesthetic along with the steroid.  You may have joint steroid injections as part of your arthritis treatment.  Joint steroid injections may be repeated, but having them too often can damage a joint or the skin over the joint.  Contact your health care provider if you have a fever, chills, or signs of infection or if you get no relief from joint pain or swelling. This information is not intended to replace advice given to you by your health care provider. Make sure you discuss any questions you have with your health care provider. Document Revised: 04/04/2018 Document Reviewed: 04/04/2018 Elsevier Patient Education  2020 [employer].

## 2020-02-19 ENCOUNTER — Ambulatory Visit: Payer: BC Managed Care – PPO | Admitting: Orthopedic Surgery

## 2020-03-10 ENCOUNTER — Other Ambulatory Visit: Payer: Self-pay

## 2020-03-10 ENCOUNTER — Ambulatory Visit (INDEPENDENT_AMBULATORY_CARE_PROVIDER_SITE_OTHER): Payer: BC Managed Care – PPO | Admitting: Orthopedic Surgery

## 2020-03-10 ENCOUNTER — Encounter: Payer: Self-pay | Admitting: Orthopedic Surgery

## 2020-03-10 VITALS — BP 156/91 | HR 85 | Ht 64.0 in | Wt 186.0 lb

## 2020-03-10 DIAGNOSIS — S46211D Strain of muscle, fascia and tendon of other parts of biceps, right arm, subsequent encounter: Secondary | ICD-10-CM | POA: Diagnosis not present

## 2020-03-10 DIAGNOSIS — M25511 Pain in right shoulder: Secondary | ICD-10-CM | POA: Diagnosis not present

## 2020-03-10 DIAGNOSIS — Z9889 Other specified postprocedural states: Secondary | ICD-10-CM | POA: Diagnosis not present

## 2020-03-10 DIAGNOSIS — G8929 Other chronic pain: Secondary | ICD-10-CM

## 2020-03-10 MED ORDER — OXYCODONE-ACETAMINOPHEN 5-325 MG PO TABS: 1.0000 | ORAL_TABLET | Freq: Four times a day (QID) | ORAL | 0 refills | Status: AC | PRN

## 2020-03-10 NOTE — Progress Notes (Signed)
Chief Complaint  Patient presents with  . Shoulder Pain    right/ 01/09/19 right shoulder scope    64 year old female status post arthroscopy right shoulder with extensive debridement and biceps tenotomy presents with chronic pain right shoulder anteriorly.  Presumably has postoperative pain fatigue from the tenotomy  She also has cervical spondylosis without myelopathy and has some neck pain  Exam reveals a well-developed well-nourished female awake alert and oriented x3 mood and affect normal.  Tenderness over the proximal biceps and right trapezius muscle  Recommend injection  Encounter Diagnoses  Name Primary?  . Rupture of right proximal biceps tendon, subsequent encounter   . S/P arthroscopy of right shoulder 01/09/19   . Chronic right shoulder pain Yes    Procedure: Biceps tendon injection Right biceps tendon was injected The patient gave verbal consent for cortisone injection Timeout confirmed the site of injection Medications used included 40 mg of Depo-Medrol and 3 mL 1% lidocaine After alcohol and ethyl chloride preparation the point of maximal tenderness was injected over the right biceps tendon there were no complications  Patient asked for a few oxycodone for the severe pain that she has had.  Bunnie Pion [facility]s ordered this encounter  Medications  . oxyCODONE-acetaminophen (PERCOCET/ROXICET) 5-325 MG tablet    Sig: Take 1 tablet by mouth every 6 (six) hours as needed for up to 7 days for severe pain.    Dispense:  10 tablet    Refill:  0

## 2020-03-17 ENCOUNTER — Other Ambulatory Visit: Payer: Self-pay | Admitting: Nurse Practitioner

## 2020-04-09 ENCOUNTER — Encounter: Payer: Self-pay | Admitting: Orthopedic Surgery

## 2020-04-09 ENCOUNTER — Ambulatory Visit (INDEPENDENT_AMBULATORY_CARE_PROVIDER_SITE_OTHER): Payer: BC Managed Care – PPO | Admitting: Orthopedic Surgery

## 2020-04-09 ENCOUNTER — Other Ambulatory Visit: Payer: Self-pay

## 2020-04-09 VITALS — BP 134/86 | HR 70 | Ht 64.0 in | Wt 186.0 lb

## 2020-04-09 DIAGNOSIS — M47812 Spondylosis without myelopathy or radiculopathy, cervical region: Secondary | ICD-10-CM

## 2020-04-09 DIAGNOSIS — Z9889 Other specified postprocedural states: Secondary | ICD-10-CM | POA: Diagnosis not present

## 2020-04-09 MED ORDER — GABAPENTIN 300 MG PO CAPS: 300.0000 mg | ORAL_CAPSULE | Freq: Every day | ORAL | 5 refills | Status: DC

## 2020-04-09 NOTE — Progress Notes (Signed)
Chief Complaint  Patient presents with  . Shoulder Pain    0 today, goes from back of shoulder/ neck down to mid arm tricep-bicep area. , 03/26/2019 S/P arthroscopy of right shoulder 01/09/19 .    63 year old female had shoulder surgery comes in today complaining of mild pain over the biceps tendon but severe pain from her neck down to her elbow with difficulty sitting in 1 position having to position her neck to get relief complaining of severe pain at night and inability to sleep  Exam reveals decreased range of motion of the right shoulder tenderness over the right bicipital groove tenderness in the cervical spine with increased muscle tension  She had an MRI of the cervical spine and had a visit for epidural injection did not get relief   C2-C3: Negative disc. New moderate left facet arthropathy. New mild to moderate left neuroforaminal stenosis. No spinal canal or right neuroforaminal stenosis.   C3-C4: No significant disc bulge or herniation. New mild bilateral facet arthropathy. No stenosis.   C4-C5: Mild disc degeneration without significant disc bulge or herniation. New moderate right and mild left facet arthropathy. New mild right greater than left neuroforaminal stenosis. No spinal canal stenosis.   C5-C6: Mild disc degeneration without significant disc bulge or herniation. Unchanged mild left uncovertebral hypertrophy and mild bilateral facet arthropathy. Unchanged mild left neuroforaminal stenosis. No spinal canal or right neuroforaminal stenosis.   C6-C7: Negative disc. Unchanged mild bilateral facet arthropathy. No stenosis.   C7-T1: Negative disc. Unchanged mild bilateral facet arthropathy. No stenosis.   IMPRESSION: 1. New mild-to-moderate left neuroforaminal stenosis at C2-C3 and mild right greater than left neuroforaminal stenosis at C4-C5 due to progressive facet arthropathy. 2. Unchanged mild left neuroforaminal stenosis at C5-C6 due to facet uncovertebral  hypertrophy.   Recommend neurosurgical referral to evaluate the cervical spine advised on any different treatment options  Start gabapentin 300 mg nightly take 5 mg hydrocodone and 1 Flexeril tablet before she goes to bed  Follow-up 6 weeks  Copy to appropriate insurers for update  Encounter Diagnoses  Name Primary?  . Cervical spondylosis without myelopathy Yes  . S/P arthroscopy of right shoulder 01/09/19

## 2020-04-09 NOTE — Patient Instructions (Signed)
At night use heat pad  Take norco Gabapentin And tizanidine

## 2020-04-15 ENCOUNTER — Telehealth (INDEPENDENT_AMBULATORY_CARE_PROVIDER_SITE_OTHER): Payer: BC Managed Care – PPO | Admitting: Nurse Practitioner

## 2020-04-15 ENCOUNTER — Other Ambulatory Visit: Payer: Self-pay

## 2020-04-15 ENCOUNTER — Telehealth: Payer: Self-pay

## 2020-04-15 DIAGNOSIS — R5383 Other fatigue: Secondary | ICD-10-CM | POA: Diagnosis not present

## 2020-04-15 NOTE — Progress Notes (Signed)
This visit occurred during the SARS-CoV-2 public health emergency.  Safety protocols were in place, including screening questions prior to the visit, additional usage of staff PPE, and extensive cleaning of exam room while observing appropriate contact time as indicated for disinfecting solutions.  Subjective:     Patient ID: Kathryn Gallegos , female    DOB: Aug 26, 1955 , 64 y.o.   MRN: [id number]   Chief Complaint  Patient presents with  . Fatigue    patient stated she has been feeling fatigued for the last couple of months she stated she stays up late doing homework until 1am and then she sleeps until 11am.     HPI  Reports she is extremely tired for the last 3 weeks. She has been staying up late to do her homework. She has been taking gabapentin for her pain to her shoulder and back of pain.  She is not sleeping well at night.  She is sleeping 6-7 hours per night and at some times 4-5 hours. She is taking 5,000 units of vitamin d over the counter.  She does feel when she eats a lot of sweets the more fatigued.       Past Medical History:  Diagnosis Date  . Anxiety   . Anxiety and depression   . Asthma   . Borderline diabetes   . Carpal tunnel syndrome 07/05/2007   Qualifier: Diagnosis of  By: [name], [name]    . Dyspnea on exertion 09/22/2018  . HIGH BLOOD PRESSURE 07/04/2007   Qualifier: Diagnosis of  By: [name], [name]    . HTN (hypertension)   . Hyperglycemia   . Hyperlipidemia   . IMPINGEMENT SYNDROME 03/09/2010   Qualifier: Diagnosis of  By: [name], [name]    . Obesity   . PSVT (paroxysmal supraventricular tachycardia) ([place]) 09/22/2018  . Pure hypercholesterolemia 09/22/2018  . Rapid palpitations   . Seasonal allergies      Family History  Problem Relation Age of Onset  . Pancreatic cancer Mother   . Esophageal cancer Maternal Grandmother   . Heart disease Maternal Grandfather   . Heart disease Maternal Uncle   . Colon cancer Maternal Aunt   .  Stomach cancer Maternal Aunt   . Arthritis Other   . Cancer Other   . Diabetes Other   . Breast cancer Neg Hx      Current Outpatient Medications:  .  acetaminophen (TYLENOL) 650 MG CR tablet, Take 1,300 mg by mouth every 8 (eight) hours as needed for pain., Disp: , Rfl:  .  Albuterol Sulfate (PROAIR RESPICLICK) 967 (90 Base) MCG/ACT AEPB, Inhale 2 puffs into the lungs every 4 (four) hours as needed (shortness of breath). , Disp: , Rfl:  .  Cholecalciferol (VITAMIN D3) 125 MCG (5000 UT) CAPS, Take 5,000 Units by mouth daily., Disp: , Rfl:  .  cyclobenzaprine (FLEXERIL) 5 MG tablet, Take 1 tablet (5 mg total) by mouth 2 (two) times daily., Disp: 60 tablet, Rfl: 1 .  diltiazem (CARDIZEM CD) 240 MG 24 hr capsule, Take 1 capsule (240 mg total) by mouth daily., Disp: 90 capsule, Rfl: 3 .  esomeprazole (NEXIUM) 20 MG capsule, Take 20 mg by mouth daily as needed (heartburn). , Disp: , Rfl:  .  fluticasone (FLONASE) 50 MCG/ACT nasal spray, Place 1 spray into both nostrils daily as needed for allergies or rhinitis., Disp: , Rfl:  .  gabapentin (NEURONTIN) 300 MG capsule, Take 1 capsule (300 mg total) by mouth at bedtime.,  Disp: 90 capsule, Rfl: 5 .  HYDROcodone-acetaminophen (NORCO/VICODIN) 5-325 MG tablet, Take 1 tablet by mouth every 6 (six) hours as needed for moderate pain., Disp: , Rfl:  .  ibuprofen (ADVIL) 800 MG tablet, Take 1 tablet (800 mg total) by mouth 3 (three) times daily with meals., Disp: 90 tablet, Rfl: 1 .  levocetirizine (XYZAL) 5 MG tablet, Take 5 mg by mouth daily. , Disp: , Rfl:  .  OXYCODONE HCL PO, Take by mouth. Take 1 tablet daily as needed, Disp: , Rfl:  .  Probiotic Product (ALIGN PO), Take 1 capsule by mouth daily., Disp: , Rfl:  .  rosuvastatin (CRESTOR) 10 MG tablet, TAKE ONE TABLET BY MOUTH DAILY (Patient not taking: Reported on 04/22/2020), Disp: 90 tablet, Rfl: 0 .  sertraline (ZOLOFT) 50 MG tablet, TAKE 1 TABLET BY MOUTH EVERY DAY, Disp: 90 tablet, Rfl: 1 .   Tetrahydrozoline HCl (VISINE OP), Place 1 drop into both eyes daily as needed (allergies). , Disp: , Rfl:  .  ZINC-VITAMIN C PO, Take 1 tablet by mouth daily., Disp: , Rfl:  .  metFORMIN (GLUCOPHAGE) 500 MG tablet, Take 1 tablet (500 mg total) by mouth 2 (two) times daily with a meal., Disp: 30 tablet, Rfl: 2   Allergies  Allergen Reactions  . Amoxicillin Other (See Comments)    Inflammation in pancreas.  Did it involve swelling of the face/tongue/throat, SOB, or low BP? No Did it involve sudden or severe rash/hives, skin peeling, or any reaction on the inside of your mouth or nose? No Did you need to seek medical attention at a hospital or doctor's office? Yes When did it last happen?5-6 years ago If all above answers are "NO", may proceed with cephalosporin use.      Review of Systems  Constitutional: Positive for fatigue.  Respiratory: Negative.  Negative for shortness of breath.   Cardiovascular: Negative.  Negative for chest pain, palpitations and leg swelling.  Neurological: Negative for dizziness and headaches.  Psychiatric/Behavioral: Negative.      There were no vitals filed for this visit. There is no height or weight on file to calculate BMI.   Objective:  Physical Exam Constitutional:      General: She is not in acute distress.    Appearance: Normal appearance.  Pulmonary:     Effort: Pulmonary effort is normal. No respiratory distress.  Neurological:     General: No focal deficit present.     Mental Status: She is alert and oriented to person, place, and time.  Psychiatric:        Mood and Affect: Mood normal.        Behavior: Behavior normal.        Thought Content: Thought content normal.        Judgment: Judgment normal.         Assessment And Plan:     1. Other fatigue  Will check for metabolic causes  I have also encouraged her to avoid sugary foods and high carbs as this can make her fatigued - Hemoglobin A1c; Future - CBC; Future - TSH;  Future - Vitamin B12; Future - VITAMIN D 25 Hydroxy (Vit-D Deficiency, Fractures); Future     Patient was given opportunity to ask questions. Patient verbalized understanding of the plan and was able to repeat key elements of the plan. All questions were answered to their satisfaction.   Teola Bradley, FNP, have reviewed all documentation for this visit. The documentation on 05/11/20 for the  exam, diagnosis, procedures, and orders are all accurate and complete.  THE PATIENT IS ENCOURAGED TO PRACTICE SOCIAL DISTANCING DUE TO THE COVID-19 PANDEMIC.

## 2020-04-15 NOTE — Telephone Encounter (Signed)
PT CONSENT TO VIRTUAL VISIT

## 2020-04-16 ENCOUNTER — Other Ambulatory Visit: Payer: Self-pay | Admitting: Nurse Practitioner

## 2020-04-16 DIAGNOSIS — R5383 Other fatigue: Secondary | ICD-10-CM | POA: Diagnosis not present

## 2020-04-17 LAB — TSH: TSH: 0.651 u[IU]/mL (ref 0.450–4.500)

## 2020-04-17 LAB — HEMOGLOBIN A1C
Est. average glucose Bld gHb Est-mCnc: 128 mg/dL
Hgb A1c MFr Bld: 6.1 % — ABNORMAL HIGH (ref 4.8–5.6)

## 2020-04-17 LAB — CBC
Hematocrit: 44 % (ref 34.0–46.6)
Hemoglobin: 15.3 g/dL (ref 11.1–15.9)
MCH: 29.4 pg (ref 26.6–33.0)
MCHC: 34.8 g/dL (ref 31.5–35.7)
MCV: 85 fL (ref 79–97)
Platelets: 227 10*3/uL (ref 150–450)
RBC: 5.2 x10E6/uL (ref 3.77–5.28)
RDW: 13.7 % (ref 11.7–15.4)
WBC: 7.2 10*3/uL (ref 3.4–10.8)

## 2020-04-17 LAB — VITAMIN D 25 HYDROXY (VIT D DEFICIENCY, FRACTURES): Vit D, 25-Hydroxy: 28.4 ng/mL — ABNORMAL LOW (ref 30.0–100.0)

## 2020-04-17 LAB — VITAMIN B12: Vitamin B-12: 708 pg/mL (ref 232–1245)

## 2020-04-18 DIAGNOSIS — H5203 Hypermetropia, bilateral: Secondary | ICD-10-CM | POA: Diagnosis not present

## 2020-04-22 ENCOUNTER — Ambulatory Visit: Payer: BC Managed Care – PPO | Admitting: Nurse Practitioner

## 2020-04-22 ENCOUNTER — Other Ambulatory Visit: Payer: Self-pay

## 2020-04-22 ENCOUNTER — Encounter: Payer: Self-pay | Admitting: Nurse Practitioner

## 2020-04-22 VITALS — BP 118/76 | HR 68 | Temp 98.0°F | Ht 64.0 in | Wt 183.0 lb

## 2020-04-22 DIAGNOSIS — I10 Essential (primary) hypertension: Secondary | ICD-10-CM | POA: Diagnosis not present

## 2020-04-22 DIAGNOSIS — Z23 Encounter for immunization: Secondary | ICD-10-CM | POA: Diagnosis not present

## 2020-04-22 DIAGNOSIS — F419 Anxiety disorder, unspecified: Secondary | ICD-10-CM | POA: Diagnosis not present

## 2020-04-22 DIAGNOSIS — R7303 Prediabetes: Secondary | ICD-10-CM

## 2020-04-22 DIAGNOSIS — E782 Mixed hyperlipidemia: Secondary | ICD-10-CM

## 2020-04-22 MED ORDER — METFORMIN HCL 500 MG PO TABS: 500.0000 mg | ORAL_TABLET | Freq: Two times a day (BID) | ORAL | 2 refills | Status: DC

## 2020-04-22 NOTE — Patient Instructions (Addendum)
Take metformin twice a day start with once a day in the evening for 2 weeks then increase to 1 tab twice a day.  Make sure you eat food with the medications.  Main side effect is GI upset.  Increase your intake of fiber.     Healthy Eating Following a healthy eating pattern may help you to achieve and maintain a healthy body weight, reduce the risk of chronic disease, and live a long and productive life. It is important to follow a healthy eating pattern at an appropriate calorie level for your body. Your nutritional needs should be met primarily through food by choosing a variety of nutrient-rich foods. What are tips for following this plan? Reading food labels  Read labels and choose the following: ? Reduced or low sodium. ? Juices with 100% fruit juice. ? Foods with low saturated fats and high polyunsaturated and monounsaturated fats. ? Foods with whole grains, such as whole wheat, cracked wheat, brown rice, and wild rice. ? Whole grains that are fortified with folic acid. This is recommended for women who are pregnant or who want to become pregnant.  Read labels and avoid the following: ? Foods with a lot of added sugars. These include foods that contain brown sugar, corn sweetener, corn syrup, dextrose, fructose, glucose, high-fructose corn syrup, honey, invert sugar, lactose, malt syrup, maltose, molasses, raw sugar, sucrose, trehalose, or turbinado sugar.  Do not eat more than the following amounts of added sugar per day:  6 teaspoons (25 g) for women.  9 teaspoons (38 g) for men. ? Foods that contain processed or refined starches and grains. ? Refined grain products, such as white flour, degermed cornmeal, white bread, and white rice. Shopping  Choose nutrient-rich snacks, such as vegetables, whole fruits, and nuts. Avoid high-calorie and high-sugar snacks, such as potato chips, fruit snacks, and candy.  Use oil-based dressings and spreads on foods instead of solid fats such as  butter, stick margarine, or cream cheese.  Limit pre-made sauces, mixes, and "instant" products such as flavored rice, instant noodles, and ready-made pasta.  Try more plant-protein sources, such as tofu, tempeh, black beans, edamame, lentils, nuts, and seeds.  Explore eating plans such as the Mediterranean diet or vegetarian diet. Cooking  Use oil to saut or stir-fry foods instead of solid fats such as butter, stick margarine, or lard.  Try baking, boiling, grilling, or broiling instead of frying.  Remove the fatty part of meats before cooking.  Steam vegetables in water or broth. Meal planning   At meals, imagine dividing your plate into fourths: ? One-half of your plate is fruits and vegetables. ? One-fourth of your plate is whole grains. ? One-fourth of your plate is protein, especially lean meats, poultry, eggs, tofu, beans, or nuts.  Include low-fat dairy as part of your daily diet. Lifestyle  Choose healthy options in all settings, including home, work, school, restaurants, or stores.  Prepare your food safely: ? Wash your hands after handling raw meats. ? Keep food preparation surfaces clean by regularly washing with hot, soapy water. ? Keep raw meats separate from ready-to-eat foods, such as fruits and vegetables. ? Cook seafood, meat, poultry, and eggs to the recommended internal temperature. ? Store foods at safe temperatures. In general:  Keep cold foods at 53F (4.4C) or below.  Keep hot foods at 153F (60C) or above.  Keep your freezer at [facility] (-17.8C) or below.  Foods are no longer safe to eat when they have been between the  temperatures of 40-140F (4.4-60C) for more than 2 hours. What foods should I eat? Fruits Aim to eat 2 cup-equivalents of fresh, canned (in natural juice), or frozen fruits each day. Examples of 1 cup-equivalent of fruit include 1 small apple, 8 large strawberries, 1 cup canned fruit,  cup dried fruit, or 1 cup 100%  juice. Vegetables Aim to eat 2-3 cup-equivalents of fresh and frozen vegetables each day, including different varieties and colors. Examples of 1 cup-equivalent of vegetables include 2 medium carrots, 2 cups raw, leafy greens, 1 cup chopped vegetable (raw or cooked), or 1 medium baked potato. Grains Aim to eat 6 ounce-equivalents of whole grains each day. Examples of 1 ounce-equivalent of grains include 1 slice of bread, 1 cup ready-to-eat cereal, 3 cups popcorn, or  cup cooked rice, pasta, or cereal. Meats and other proteins Aim to eat 5-6 ounce-equivalents of protein each day. Examples of 1 ounce-equivalent of protein include 1 egg, 1/2 cup nuts or seeds, or 1 tablespoon (16 g) peanut butter. A cut of meat or fish that is the size of a deck of cards is about 3-4 ounce-equivalents.  Of the protein you eat each week, try to have at least 8 ounces come from seafood. This includes salmon, trout, herring, and anchovies. Dairy Aim to eat 3 cup-equivalents of fat-free or low-fat dairy each day. Examples of 1 cup-equivalent of dairy include 1 cup (240 mL) milk, 8 ounces (250 g) yogurt, 1 ounces (44 g) natural cheese, or 1 cup (240 mL) fortified soy milk. Fats and oils  Aim for about 5 teaspoons (21 g) per day. Choose monounsaturated fats, such as canola and olive oils, avocados, peanut butter, and most nuts, or polyunsaturated fats, such as sunflower, corn, and soybean oils, walnuts, pine nuts, sesame seeds, sunflower seeds, and flaxseed. Beverages  Aim for six 8-oz glasses of water per day. Limit coffee to three to five 8-oz cups per day.  Limit caffeinated beverages that have added calories, such as soda and energy drinks.  Limit alcohol intake to no more than 1 drink a day for nonpregnant women and 2 drinks a day for men. One drink equals 12 oz of beer (355 mL), 5 oz of wine (148 mL), or 1 oz of hard liquor (44 mL). Seasoning and other foods  Avoid adding excess amounts of salt to your foods.  Try flavoring foods with herbs and spices instead of salt.  Avoid adding sugar to foods.  Try using oil-based dressings, sauces, and spreads instead of solid fats. This information is based on general [place] nutrition guidelines. For more information, visit [employer]. Exact amounts may vary based on your nutrition needs. Summary  A healthy eating plan may help you to maintain a healthy weight, reduce the risk of chronic diseases, and stay active throughout your life.  Plan your meals. Make sure you eat the right portions of a variety of nutrient-rich foods.  Try baking, boiling, grilling, or broiling instead of frying.  Choose healthy options in all settings, including home, work, school, restaurants, or stores. This information is not intended to replace advice given to you by your health care provider. Make sure you discuss any questions you have with your health care provider. Document Revised: 11/14/2017 Document Reviewed: 11/14/2017 Elsevier Patient Education  [place].  Influenza Virus Vaccine (Flucelvax) What is this medicine? INFLUENZA VIRUS VACCINE (in floo EN zuh VAHY ruhs vak SEEN) helps to reduce the risk of getting influenza also known as the flu. The vaccine only  helps protect you against some strains of the flu. This medicine may be used for other purposes; ask your health care provider or pharmacist if you have questions. COMMON BRAND NAME(S): FLUCELVAX What should I tell my health care provider before I take this medicine? They need to know if you have any of these conditions:  bleeding disorder like hemophilia  fever or infection  Guillain-Barre syndrome or other neurological problems  immune system problems  infection with the human immunodeficiency virus (HIV) or AIDS  low blood platelet counts  multiple sclerosis  an unusual or allergic reaction to influenza virus vaccine, other medicines, foods, dyes or preservatives  pregnant or trying  to get pregnant  breast-feeding How should I use this medicine? This vaccine is for injection into a muscle. It is given by a health care professional. A copy of Vaccine Information Statements will be given before each vaccination. Read this sheet carefully each time. The sheet may change frequently. Talk to your pediatrician regarding the use of this medicine in children. Special care may be needed. Overdosage: If you think you've taken too much of this medicine contact a poison control center or emergency room at once. Overdosage: If you think you have taken too much of this medicine contact a poison control center or emergency room at once. NOTE: This medicine is only for you. Do not share this medicine with others. What if I miss a dose? This does not apply. What may interact with this medicine?  chemotherapy or radiation therapy  medicines that lower your immune system like etanercept, anakinra, infliximab, and adalimumab  medicines that treat or prevent blood clots like warfarin  phenytoin  steroid medicines like prednisone or cortisone  theophylline  vaccines This list may not describe all possible interactions. Give your health care provider a list of all the medicines, herbs, non-prescription drugs, or dietary supplements you use. Also tell them if you smoke, drink alcohol, or use illegal drugs. Some items may interact with your medicine. What should I watch for while using this medicine? Report any side effects that do not go away within 3 days to your doctor or health care professional. Call your health care provider if any unusual symptoms occur within 6 weeks of receiving this vaccine. You may still catch the flu, but the illness is not usually as bad. You cannot get the flu from the vaccine. The vaccine will not protect against colds or other illnesses that may cause fever. The vaccine is needed every year. What side effects may I notice from receiving this  medicine? Side effects that you should report to your doctor or health care professional as soon as possible:  allergic reactions like skin rash, itching or hives, swelling of the face, lips, or tongue Side effects that usually do not require medical attention (Report these to your doctor or health care professional if they continue or are bothersome.):  fever  headache  muscle aches and pains  pain, tenderness, redness, or swelling at the injection site  tiredness This list may not describe all possible side effects. Call your doctor for medical advice about side effects. You may report side effects to FDA at 1-800-FDA-1088. Where should I keep my medicine? The vaccine will be given by a health care professional in a clinic, pharmacy, doctor's office, or other health care setting. You will not be given vaccine doses to store at home. NOTE: This sheet is a summary. It may not cover all possible information. If you have questions  about this medicine, talk to your doctor, pharmacist, or health care provider.  2020 Elsevier/Gold Standard (2011-07-14 14:06:47)

## 2020-04-22 NOTE — Progress Notes (Signed)
Rutherford Nail as a scribe for Minette Brine, FNP.,have documented all relevant documentation on the behalf of Minette Brine, FNP,as directed by  Minette Brine, FNP while in the presence of Minette Brine, [place].  This visit occurred during the SARS-CoV-2 public health emergency.  Safety protocols were in place, including screening questions prior to the visit, additional usage of staff PPE, and extensive cleaning of exam room while observing appropriate contact time as indicated for disinfecting solutions.  Subjective:     Patient ID: Kathryn Gallegos , female    DOB: [birth year] , 64 y.o.   MRN: [id number]   Chief Complaint  Patient presents with   Hypertension    HPI  She does not feel as tired after stopping eating as many sweets.  She is planning to start back walking.   She is going to neurologist for the bulging disc to the pack of her neck.    She is scheduled to see Dr. Einar Gip once a year.    Anxiety Presents for follow-up visit. Patient reports no chest pain, dizziness or palpitations. The quality of sleep is good.       Past Medical History:  Diagnosis Date   Anxiety    Anxiety and depression    Asthma    Borderline diabetes    Carpal tunnel syndrome 07/05/2007   Qualifier: Diagnosis of  By: [name], [place]     Dyspnea on exertion 09/22/2018   HIGH BLOOD PRESSURE 07/04/2007   Qualifier: Diagnosis of  By: [name], [name]     HTN (hypertension)    Hyperglycemia    Hyperlipidemia    IMPINGEMENT SYNDROME 03/09/2010   Qualifier: Diagnosis of  By: [name], [name]     Obesity    PSVT (paroxysmal supraventricular tachycardia) ([place]) 09/22/2018   Pure hypercholesterolemia 09/22/2018   Rapid palpitations    Seasonal allergies      Family History  Problem Relation Age of Onset   Pancreatic cancer Mother    Esophageal cancer Maternal Grandmother    Heart disease Maternal Grandfather    Heart disease Maternal Uncle    Colon cancer  Maternal Aunt    Stomach cancer Maternal Aunt    Arthritis Other    Cancer Other    Diabetes Other    Breast cancer Neg Hx      Current Outpatient Medications:    acetaminophen (TYLENOL) 650 MG CR tablet, Take 1,300 mg by mouth every 8 (eight) hours as needed for pain., Disp: , Rfl:    Albuterol Sulfate (PROAIR RESPICLICK) 962 (90 Base) MCG/ACT AEPB, Inhale 2 puffs into the lungs every 4 (four) hours as needed (shortness of breath). , Disp: , Rfl:    Cholecalciferol (VITAMIN D3) 125 MCG (5000 UT) CAPS, Take 5,000 Units by mouth daily., Disp: , Rfl:    cyclobenzaprine (FLEXERIL) 5 MG tablet, Take 1 tablet (5 mg total) by mouth 2 (two) times daily., Disp: 60 tablet, Rfl: 1   diltiazem (CARDIZEM CD) 240 MG 24 hr capsule, Take 1 capsule (240 mg total) by mouth daily., Disp: 90 capsule, Rfl: 3   esomeprazole (NEXIUM) 20 MG capsule, Take 20 mg by mouth daily as needed (heartburn). , Disp: , Rfl:    fluticasone (FLONASE) 50 MCG/ACT nasal spray, Place 1 spray into both nostrils daily as needed for allergies or rhinitis., Disp: , Rfl:    gabapentin (NEURONTIN) 300 MG capsule, Take 1 capsule (300 mg total) by mouth at bedtime., Disp: 90 capsule, Rfl: 5  HYDROcodone-acetaminophen (NORCO/VICODIN) 5-325 MG tablet, Take 1 tablet by mouth every 6 (six) hours as needed for moderate pain., Disp: , Rfl:    ibuprofen (ADVIL) 800 MG tablet, Take 1 tablet (800 mg total) by mouth 3 (three) times daily with meals., Disp: 90 tablet, Rfl: 1   levocetirizine (XYZAL) 5 MG tablet, Take 5 mg by mouth daily. , Disp: , Rfl:    OXYCODONE HCL PO, Take by mouth. Take 1 tablet daily as needed, Disp: , Rfl:    Probiotic Product (ALIGN PO), Take 1 capsule by mouth daily., Disp: , Rfl:    sertraline (ZOLOFT) 50 MG tablet, TAKE 1 TABLET BY MOUTH EVERY DAY, Disp: 90 tablet, Rfl: 1   Tetrahydrozoline HCl (VISINE OP), Place 1 drop into both eyes daily as needed (allergies). , Disp: , Rfl:    ZINC-VITAMIN C PO,  Take 1 tablet by mouth daily., Disp: , Rfl:    metFORMIN (GLUCOPHAGE) 500 MG tablet, Take 1 tablet (500 mg total) by mouth 2 (two) times daily with a meal., Disp: 30 tablet, Rfl: 2   rosuvastatin (CRESTOR) 10 MG tablet, TAKE ONE TABLET BY MOUTH DAILY (Patient not taking: Reported on 04/22/2020), Disp: 90 tablet, Rfl: 0   Allergies  Allergen Reactions   Amoxicillin Other (See Comments)    Inflammation in pancreas.  Did it involve swelling of the face/tongue/throat, SOB, or low BP? No Did it involve sudden or severe rash/hives, skin peeling, or any reaction on the inside of your mouth or nose? No Did you need to seek medical attention at a hospital or doctor's office? Yes When did it last happen?5-6 years ago If all above answers are NO, may proceed with cephalosporin use.      Review of Systems  Constitutional: Negative.   Respiratory: Negative.   Cardiovascular: Negative.  Negative for chest pain, palpitations and leg swelling.  Musculoskeletal: Negative.   Skin: Negative.   Neurological: Negative for dizziness and headaches.  Psychiatric/Behavioral: Negative.      Today's Vitals   04/22/20 1133  BP: 118/76  Pulse: 68  Temp: 98 F (36.7 C)  TempSrc: Oral  Weight: 183 lb (83 kg)  Height: 5\' 4"  (1.626 m)  PainSc: 0-No pain   Body mass index is 31.41 kg/m.   Objective:  Physical Exam Constitutional:      General: She is not in acute distress.    Appearance: Normal appearance. She is obese.  Cardiovascular:     Rate and Rhythm: Normal rate and regular rhythm.     Pulses: Normal pulses.     Heart sounds: Normal heart sounds. No murmur heard.   Pulmonary:     Effort: Pulmonary effort is normal. No respiratory distress.     Breath sounds: Normal breath sounds. No wheezing.  Neurological:     General: No focal deficit present.     Mental Status: She is alert and oriented to person, place, and time.     Cranial Nerves: No cranial nerve deficit.  Psychiatric:         Mood and Affect: Mood normal.        Behavior: Behavior normal.        Thought Content: Thought content normal.        Judgment: Judgment normal.      Assessment And Plan:     1. Essential hypertension  Chronic, blood pressure is well controlled  Continue follow up with Dr. Einar Gip  2. Anxiety  Chronic, stable  Continue current medications  3.  Mixed hyperlipidemia  Chronic, stable  Continue with current medications, tolerating well  4. Prediabetes  Chronic,   Will start metformin twice a day  Educated on eating with food and if has diarrhea or vomiting to hold medication  This may also help with her weight loss - metFORMIN (GLUCOPHAGE) 500 MG tablet; Take 1 tablet (500 mg total) by mouth 2 (two) times daily with a meal.  Dispense: 30 tablet; Refill: 2 - Ambulatory referral to diabetic education  5. Need for influenza vaccination  Influenza vaccine administered  Encouraged to take Tylenol as needed for fever or muscle aches. - Flu Vaccine QUAD 6+ mos PF IM (Fluarix Quad PF)    Patient was given opportunity to ask questions. Patient verbalized understanding of the plan and was able to repeat key elements of the plan. All questions were answered to their satisfaction.  Minette Brine, FNP   I, Minette Brine, FNP, have reviewed all documentation for this visit. The documentation on 04/22/20 for the exam, diagnosis, procedures, and orders are all accurate and complete.  THE PATIENT IS ENCOURAGED TO PRACTICE SOCIAL DISTANCING DUE TO THE COVID-19 PANDEMIC.

## 2020-04-30 DIAGNOSIS — M542 Cervicalgia: Secondary | ICD-10-CM | POA: Diagnosis not present

## 2020-04-30 DIAGNOSIS — I10 Essential (primary) hypertension: Secondary | ICD-10-CM | POA: Diagnosis not present

## 2020-04-30 DIAGNOSIS — Z6831 Body mass index (BMI) 31.0-31.9, adult: Secondary | ICD-10-CM | POA: Diagnosis not present

## 2020-05-11 ENCOUNTER — Encounter: Payer: Self-pay | Admitting: Nurse Practitioner

## 2020-05-19 ENCOUNTER — Other Ambulatory Visit: Payer: Self-pay | Admitting: Physician Assistant

## 2020-05-19 DIAGNOSIS — M542 Cervicalgia: Secondary | ICD-10-CM

## 2020-05-21 ENCOUNTER — Ambulatory Visit: Payer: BC Managed Care – PPO | Admitting: Orthopedic Surgery

## 2020-06-03 ENCOUNTER — Encounter: Payer: Self-pay | Admitting: Nurse Practitioner

## 2020-06-03 ENCOUNTER — Other Ambulatory Visit: Payer: Self-pay

## 2020-06-03 ENCOUNTER — Ambulatory Visit
Admission: RE | Admit: 2020-06-03 | Discharge: 2020-06-03 | Disposition: A | Discharge status: Home / Self Care | Payer: BC Managed Care – PPO | Source: Ambulatory Visit | Attending: Physician Assistant | Admitting: Physician Assistant

## 2020-06-03 ENCOUNTER — Ambulatory Visit (INDEPENDENT_AMBULATORY_CARE_PROVIDER_SITE_OTHER): Payer: BC Managed Care – PPO | Admitting: Nurse Practitioner

## 2020-06-03 VITALS — BP 128/86 | HR 82 | Temp 98.3°F | Ht 61.8 in | Wt 179.6 lb

## 2020-06-03 DIAGNOSIS — R7303 Prediabetes: Secondary | ICD-10-CM

## 2020-06-03 DIAGNOSIS — M542 Cervicalgia: Secondary | ICD-10-CM

## 2020-06-03 DIAGNOSIS — M4802 Spinal stenosis, cervical region: Secondary | ICD-10-CM | POA: Diagnosis not present

## 2020-06-03 NOTE — Progress Notes (Signed)
This visit occurred during the SARS-CoV-2 public health emergency.  Safety protocols were in place, including screening questions prior to the visit, additional usage of staff PPE, and extensive cleaning of exam room while observing appropriate contact time as indicated for disinfecting solutions.  Subjective:     Patient ID: Kathryn Gallegos , female    DOB: 10-27-1955 , 64 y.o.   MRN: [id number]   Chief Complaint  Patient presents with  . Prediabetes    HPI  Patient here for a med check on metformin. When she first started she was going and has improved since taking on a regular.  She is walking at least 3-4 times a week, has slacked up a little.  She is drinking 18-24 oz of water a day.  She has cut back on her sweets.    Wt Readings from Last 3 Encounters: 06/03/20 : 179 lb 9.6 oz (81.5 kg) 04/22/20 : 183 lb (83 kg) 04/09/20 : 186 lb (84.4 kg)  She is going to Dr. Aline Brochure for an MRI of her neck due to having severe pain thought to be related to bulging disc.      Past Medical History:  Diagnosis Date  . Anxiety   . Anxiety and depression   . Asthma   . Borderline diabetes   . Carpal tunnel syndrome 07/05/2007   Qualifier: Diagnosis of  By: [name], [name]    . Dyspnea on exertion 09/22/2018  . HIGH BLOOD PRESSURE 07/04/2007   Qualifier: Diagnosis of  By: [name], [name]    . HTN (hypertension)   . Hyperglycemia   . Hyperlipidemia   . IMPINGEMENT SYNDROME 03/09/2010   Qualifier: Diagnosis of  By: [name], [name]    . Obesity   . PSVT (paroxysmal supraventricular tachycardia) ([place]) 09/22/2018  . Pure hypercholesterolemia 09/22/2018  . Rapid palpitations   . Seasonal allergies      Family History  Problem Relation Age of Onset  . Pancreatic cancer Mother   . Esophageal cancer Maternal Grandmother   . Heart disease Maternal Grandfather   . Heart disease Maternal Uncle   . Colon cancer Maternal Aunt   . Stomach cancer Maternal Aunt   . Arthritis Other    . Cancer Other   . Diabetes Other   . Breast cancer Neg Hx      Current Outpatient Medications:  .  acetaminophen (TYLENOL) 650 MG CR tablet, Take 1,300 mg by mouth every 8 (eight) hours as needed for pain., Disp: , Rfl:  .  Albuterol Sulfate (PROAIR RESPICLICK) 413 (90 Base) MCG/ACT AEPB, Inhale 2 puffs into the lungs every 4 (four) hours as needed (shortness of breath). , Disp: , Rfl:  .  Cholecalciferol (VITAMIN D3) 125 MCG (5000 UT) CAPS, Take 5,000 Units by mouth daily., Disp: , Rfl:  .  cyclobenzaprine (FLEXERIL) 5 MG tablet, Take 1 tablet (5 mg total) by mouth 2 (two) times daily., Disp: 60 tablet, Rfl: 1 .  diltiazem (CARDIZEM CD) 240 MG 24 hr capsule, Take 1 capsule (240 mg total) by mouth daily., Disp: 90 capsule, Rfl: 3 .  esomeprazole (NEXIUM) 20 MG capsule, Take 20 mg by mouth daily as needed (heartburn). , Disp: , Rfl:  .  fluticasone (FLONASE) 50 MCG/ACT nasal spray, Place 1 spray into both nostrils daily as needed for allergies or rhinitis., Disp: , Rfl:  .  gabapentin (NEURONTIN) 300 MG capsule, Take 1 capsule (300 mg total) by mouth at bedtime., Disp: 90 capsule, Rfl: 5 .  HYDROcodone-acetaminophen (NORCO/VICODIN) 5-325 MG tablet, Take 1 tablet by mouth every 6 (six) hours as needed for moderate pain., Disp: , Rfl:  .  ibuprofen (ADVIL) 800 MG tablet, Take 1 tablet (800 mg total) by mouth 3 (three) times daily with meals., Disp: 90 tablet, Rfl: 1 .  levocetirizine (XYZAL) 5 MG tablet, Take 5 mg by mouth daily. , Disp: , Rfl:  .  metFORMIN (GLUCOPHAGE) 500 MG tablet, Take 1 tablet (500 mg total) by mouth 2 (two) times daily with a meal., Disp: 30 tablet, Rfl: 2 .  OXYCODONE HCL PO, Take by mouth. Take 1 tablet daily as needed, Disp: , Rfl:  .  Probiotic Product (ALIGN PO), Take 1 capsule by mouth daily., Disp: , Rfl:  .  sertraline (ZOLOFT) 50 MG tablet, TAKE 1 TABLET BY MOUTH EVERY DAY, Disp: 90 tablet, Rfl: 1 .  Tetrahydrozoline HCl (VISINE OP), Place 1 drop into both eyes  daily as needed (allergies). , Disp: , Rfl:  .  ZINC-VITAMIN C PO, Take 1 tablet by mouth daily., Disp: , Rfl:  .  rosuvastatin (CRESTOR) 10 MG tablet, TAKE ONE TABLET BY MOUTH DAILY (Patient not taking: Reported on 04/22/2020), Disp: 90 tablet, Rfl: 0   Allergies  Allergen Reactions  . Amoxicillin Other (See Comments)    Inflammation in pancreas.  Did it involve swelling of the face/tongue/throat, SOB, or low BP? No Did it involve sudden or severe rash/hives, skin peeling, or any reaction on the inside of your mouth or nose? No Did you need to seek medical attention at a hospital or doctor's office? Yes When did it last happen?5-6 years ago If all above answers are "NO", may proceed with cephalosporin use.      Review of Systems  Constitutional: Negative.  Negative for fatigue.  Cardiovascular: Negative for chest pain, palpitations and leg swelling.  Neurological: Negative for dizziness and headaches.  Psychiatric/Behavioral: Negative.      Today's Vitals   06/03/20 1525  BP: 128/86  Pulse: 82  Temp: 98.3 F (36.8 C)  TempSrc: Oral  Weight: 179 lb 9.6 oz (81.5 kg)  Height: 5' 1.8" (1.57 m)  PainSc: 0-No pain   Body mass index is 33.06 kg/m.   Objective:  Physical Exam Vitals reviewed.  Constitutional:      General: She is not in acute distress.    Appearance: Normal appearance. She is well-developed. She is obese.  Cardiovascular:     Rate and Rhythm: Normal rate and regular rhythm.     Pulses: Normal pulses.     Heart sounds: Normal heart sounds. No murmur heard.   Pulmonary:     Effort: Pulmonary effort is normal. No respiratory distress.     Breath sounds: Normal breath sounds. No wheezing.  Chest:     Chest wall: No tenderness.  Skin:    General: Skin is warm and dry.     Capillary Refill: Capillary refill takes less than 2 seconds.  Neurological:     General: No focal deficit present.     Mental Status: She is alert and oriented to person, place,  and time.     Cranial Nerves: No cranial nerve deficit.  Psychiatric:        Mood and Affect: Mood normal.        Behavior: Behavior normal.        Thought Content: Thought content normal.        Judgment: Judgment normal.         Assessment And  Plan:     1. Prediabetes  She is tolerating metformin well  She has also lost approximately 5 lbs since her last visit     Patient was given opportunity to ask questions. Patient verbalized understanding of the plan and was able to repeat key elements of the plan. All questions were answered to their satisfaction.   Teola Bradley, FNP, have reviewed all documentation for this visit. The documentation on 06/08/20 for the exam, diagnosis, procedures, and orders are all accurate and complete.  THE PATIENT IS ENCOURAGED TO PRACTICE SOCIAL DISTANCING DUE TO THE COVID-19 PANDEMIC.

## 2020-06-05 DIAGNOSIS — M5030 Other cervical disc degeneration, unspecified cervical region: Secondary | ICD-10-CM | POA: Diagnosis not present

## 2020-06-11 ENCOUNTER — Ambulatory Visit: Payer: BC Managed Care – PPO | Admitting: Orthopedic Surgery

## 2020-06-16 ENCOUNTER — Ambulatory Visit: Payer: BC Managed Care – PPO | Admitting: Orthopedic Surgery

## 2020-06-16 ENCOUNTER — Other Ambulatory Visit: Payer: Self-pay

## 2020-06-16 ENCOUNTER — Encounter: Payer: Self-pay | Admitting: Orthopedic Surgery

## 2020-06-16 VITALS — BP 127/83 | HR 81 | Ht 62.0 in | Wt 178.0 lb

## 2020-06-16 DIAGNOSIS — M47812 Spondylosis without myelopathy or radiculopathy, cervical region: Secondary | ICD-10-CM

## 2020-06-16 NOTE — Progress Notes (Signed)
Chief Complaint  Patient presents with  . Shoulder Pain    right   . Neck Pain    into right shoulder has seen neurosurgery they want her to see pain management     Shatonya had an MRI she has degenerative disc disease at multiple levels however the neurosurgical office does not think she can have surgery to relieve it recommended pain management continue anti-inflammatories muscle relaxers and see how that goes and try to relieve her pain that way  I will see her in 6 weeks  I looked at her MRI it does show that multilevel degenerative disc disease too many to isolate there are some changes that may suggest right more than left at certain levels  Encounter Diagnosis  Name Primary?  . Cervical spondylosis without myelopathy Yes   C7-T1: Normal interspace. Mild to moderate bilateral facet hypertrophy. No spinal stenosis. Foramina remain patent. C7-T1: Normal interspace. Mild to moderate bilateral facet hypertrophy. No spinal stenosis. Foramina remain patent. C4-C5: Minimal annular disc bulge with disc desiccation. Moderate right with mild left facet hypertrophy. No spinal stenosis. Resultant mild right C5 foraminal stenosis. No significant left foraminal narrowing. Appearance is stable.   C5-C6: Mild disc bulge with uncovertebral hypertrophy. Mild bilateral facet hypertrophy, slightly worse on the right. No spinal stenosis. Mild left C6 foraminal narrowing. No significant right foraminal encroachment. Appearance is stable. C7-T1: Normal interspace. Mild to moderate bilateral facet hypertrophy. No spinal stenosis. Foramina remain patent. IMPRESSION: 1. Multifactorial degenerative changes with resultant multilevel foraminal narrowing as above. Notable findings include moderate left C3 foraminal stenosis, with mild right C5 and left C6 foraminal narrowing. No significant spinal stenosis or evidence for cord impingement within the cervical spine. 2. Mild reactive marrow edema about the  right C7-T1 facet due to facet arthritis. Finding could serve as a source for right-sided neck pain.     Electronically Signed   By: Jeannine Boga M.D.   On: 06/03/2020 21:17

## 2020-06-16 NOTE — Addendum Note (Signed)
Addended by: [name] on: 06/16/2020 03:52 PM   Modules accepted: Level of Service

## 2020-06-27 ENCOUNTER — Ambulatory Visit (INDEPENDENT_AMBULATORY_CARE_PROVIDER_SITE_OTHER): Payer: BC Managed Care – PPO

## 2020-06-27 ENCOUNTER — Other Ambulatory Visit: Payer: Self-pay

## 2020-06-27 DIAGNOSIS — Z23 Encounter for immunization: Secondary | ICD-10-CM

## 2020-06-27 NOTE — Progress Notes (Signed)
   Covid-[facility] Vaccination [facility]  Name:  Kathryn Gallegos    MRN: [id number] DOB: [birth year]  06/27/2020  [name] was observed post Covid-19 immunization for 15 minutes without incident. She was provided with Vaccine Information Sheet and instruction to access the V-Safe system.   [name] was instructed to call 911 with any severe reactions post vaccine: Marland Kitchen Difficulty breathing  . Swelling of face and throat  . A fast heartbeat  . A bad rash all over body  . Dizziness and weakness

## 2020-07-16 DIAGNOSIS — Z6831 Body mass index (BMI) 31.0-31.9, adult: Secondary | ICD-10-CM | POA: Diagnosis not present

## 2020-07-16 DIAGNOSIS — M5030 Other cervical disc degeneration, unspecified cervical region: Secondary | ICD-10-CM | POA: Diagnosis not present

## 2020-07-16 DIAGNOSIS — M47812 Spondylosis without myelopathy or radiculopathy, cervical region: Secondary | ICD-10-CM | POA: Insufficient documentation

## 2020-07-21 ENCOUNTER — Ambulatory Visit: Payer: BC Managed Care – PPO | Admitting: Nurse Practitioner

## 2020-07-23 ENCOUNTER — Encounter: Payer: Self-pay | Admitting: Nurse Practitioner

## 2020-07-23 ENCOUNTER — Ambulatory Visit: Payer: BC Managed Care – PPO | Admitting: Nurse Practitioner

## 2020-07-23 ENCOUNTER — Other Ambulatory Visit: Payer: Self-pay

## 2020-07-23 VITALS — BP 140/82 | HR 83 | Temp 97.7°F | Ht 62.0 in | Wt 180.0 lb

## 2020-07-23 DIAGNOSIS — R7303 Prediabetes: Secondary | ICD-10-CM | POA: Diagnosis not present

## 2020-07-23 DIAGNOSIS — E782 Mixed hyperlipidemia: Secondary | ICD-10-CM | POA: Diagnosis not present

## 2020-07-23 DIAGNOSIS — I10 Essential (primary) hypertension: Secondary | ICD-10-CM | POA: Diagnosis not present

## 2020-07-23 MED ORDER — BLOOD GLUCOSE MONITOR SYSTEM W/DEVICE KIT: 1.0000 | PACK | Freq: Every day | 0 refills | Status: AC

## 2020-07-23 NOTE — Patient Instructions (Signed)
Prediabetes Prediabetes is the condition of having a blood sugar (blood glucose) level that is higher than it should be, but not high enough for you to be diagnosed with type 2 diabetes. Having prediabetes puts you at risk for developing type 2 diabetes (type 2 diabetes mellitus). Prediabetes may be called impaired glucose tolerance or impaired fasting glucose. Prediabetes usually does not cause symptoms. Your health care provider can diagnose this condition with blood tests. You may be tested for prediabetes if you are overweight and if you have at least one other risk factor for prediabetes. What is blood glucose, and how is it measured? Blood glucose refers to the amount of glucose in your bloodstream. Glucose comes from eating foods that contain sugars and starches (carbohydrates), which the body breaks down into glucose. Your blood glucose level may be measured in mg/dL (milligrams per deciliter) or mmol/L (millimoles per liter). Your blood glucose may be checked with one or more of the following blood tests:  A fasting blood glucose (FBG) test. You will not be allowed to eat (you will fast) for 8 hours or longer before a blood sample is taken. ? A normal range for FBG is 70-100 mg/dl (3.9-5.6 mmol/L).  An A1c (hemoglobin A1c) blood test. This test provides information about blood glucose control over the previous 2?3months.  An oral glucose tolerance test (OGTT). This test measures your blood glucose at two times: ? After fasting. This is your baseline level. ? Two hours after you drink a beverage that contains glucose. You may be diagnosed with prediabetes:  If your FBG is 100?125 mg/dL (5.6-6.9 mmol/L).  If your A1c level is 5.7?6.4%.  If your OGTT result is 140?199 mg/dL (7.8-11 mmol/L). These blood tests may be repeated to confirm your diagnosis. How can this condition affect me? The pancreas produces a hormone (insulin) that helps to move glucose from the bloodstream into cells.  When cells in the body do not respond properly to insulin that the body makes (insulin resistance), excess glucose builds up in the blood instead of going into cells. As a result, high blood glucose (hyperglycemia) can develop, which can cause many complications. Hyperglycemia is a symptom of prediabetes. Having high blood glucose for a long time is dangerous. Too much glucose in your blood can damage your nerves and blood vessels. Long-term damage can lead to complications from diabetes, which may include:  Heart disease.  Stroke.  Blindness.  Kidney disease.  Depression.  Poor circulation in the feet and legs, which could lead to surgical removal (amputation) in severe cases. What can increase my risk? Risk factors for prediabetes include:  Having a family member with type 2 diabetes.  Being overweight or obese.  Being older than age 45.  Being of American Indian, African-American, Hispanic/Latino, or Asian/Pacific Islander descent.  Having an inactive (sedentary) lifestyle.  Having a history of heart disease.  History of gestational diabetes or polycystic ovary syndrome (PCOS), in women.  Having low levels of good cholesterol (HDL-C) or high levels of blood fats (triglycerides).  Having high blood pressure. What actions can I take to prevent diabetes?      Be physically active. ? Do moderate-intensity physical activity for 30 or more minutes on 5 or more days of the week, or as much as told by your health care provider. This could be brisk walking, biking, or water aerobics. ? Ask your health care provider what activities are safe for you. A mix of physical activities may be best, such as   walking, swimming, cycling, and strength training.  Lose weight as told by your health care provider. ? Losing 5-7% of your body weight can reverse insulin resistance. ? Your health care provider can determine how much weight loss is best for you and can help you lose weight  safely.  Follow a healthy meal plan. This includes eating lean proteins, complex carbohydrates, fresh fruits and vegetables, low-fat dairy products, and healthy fats. ? Follow instructions from your health care provider about eating or drinking restrictions. ? Make an appointment to see a diet and nutrition specialist (registered dietitian) to help you create a healthy eating plan that is right for you.  Do not smoke or use any tobacco products, such as cigarettes, chewing tobacco, and e-cigarettes. If you need help quitting, ask your health care provider.  Take over-the-counter and prescription medicines as told by your health care provider. You may be prescribed medicines that help lower the risk of type 2 diabetes.  Keep all follow-up visits as told by your health care provider. This is important. Summary  Prediabetes is the condition of having a blood sugar (blood glucose) level that is higher than it should be, but not high enough for you to be diagnosed with type 2 diabetes.  Having prediabetes puts you at risk for developing type 2 diabetes (type 2 diabetes mellitus).  To help prevent type 2 diabetes, make lifestyle changes such as being physically active and eating a healthy diet. Lose weight as told by your health care provider. This information is not intended to replace advice given to you by your health care provider. Make sure you discuss any questions you have with your health care provider. Document Revised: 11/24/2018 Document Reviewed: 09/23/2015 Elsevier Patient Education  2020 [employer]  

## 2020-07-23 NOTE — Progress Notes (Signed)
This visit occurred during the SARS-CoV-2 public health emergency.  Safety protocols were in place, including screening questions prior to the visit, additional usage of staff PPE, and extensive cleaning of exam room while observing appropriate contact time as indicated for disinfecting solutions.  Subjective:     Patient ID: Kathryn Gallegos , female    DOB: [birth year] , 64 y.o.   MRN: [id number]   Chief Complaint  Patient presents with  . Prediabetes    HPI  Patient here for a f/u on her prediabetes.  She has been taking metformin once a day because she was afraid she may have loose bowels.   Wt Readings from Last 3 Encounters: 07/23/20 : 180 lb (81.6 kg) 06/16/20 : 178 lb (80.7 kg) 06/03/20 : 179 lb 9.6 oz (81.5 kg)    Past Medical History:  Diagnosis Date  . Anxiety   . Anxiety and depression   . Asthma   . Borderline diabetes   . Carpal tunnel syndrome 07/05/2007   Qualifier: Diagnosis of  By: [name], [name]    . Dyspnea on exertion 09/22/2018  . HIGH BLOOD PRESSURE 07/04/2007   Qualifier: Diagnosis of  By: [name], [name]    . HTN (hypertension)   . Hyperglycemia   . Hyperlipidemia   . IMPINGEMENT SYNDROME 03/09/2010   Qualifier: Diagnosis of  By: [name], [name]    . Obesity   . PSVT (paroxysmal supraventricular tachycardia) ([place]) 09/22/2018  . Pure hypercholesterolemia 09/22/2018  . Rapid palpitations   . Seasonal allergies      Family History  Problem Relation Age of Onset  . Pancreatic cancer Mother   . Esophageal cancer Maternal Grandmother   . Heart disease Maternal Grandfather   . Heart disease Maternal Uncle   . Colon cancer Maternal Aunt   . Stomach cancer Maternal Aunt   . Arthritis Other   . Cancer Other   . Diabetes Other   . Breast cancer Neg Hx      Current Outpatient Medications:  .  acetaminophen (TYLENOL) 650 MG CR tablet, Take 1,300 mg by mouth every 8 (eight) hours as needed for pain., Disp: , Rfl:  .  Cholecalciferol  (VITAMIN D3) 125 MCG (5000 UT) CAPS, Take 5,000 Units by mouth daily., Disp: , Rfl:  .  cyclobenzaprine (FLEXERIL) 5 MG tablet, Take 1 tablet (5 mg total) by mouth 2 (two) times daily., Disp: 60 tablet, Rfl: 1 .  diltiazem (CARDIZEM CD) 240 MG 24 hr capsule, Take 1 capsule (240 mg total) by mouth daily., Disp: 90 capsule, Rfl: 3 .  esomeprazole (NEXIUM) 20 MG capsule, Take 20 mg by mouth daily as needed (heartburn). , Disp: , Rfl:  .  fluticasone (FLONASE) 50 MCG/ACT nasal spray, Place 1 spray into both nostrils daily as needed for allergies or rhinitis., Disp: , Rfl:  .  HYDROcodone-acetaminophen (NORCO/VICODIN) 5-325 MG tablet, Take 1 tablet by mouth every 6 (six) hours as needed for moderate pain., Disp: , Rfl:  .  ibuprofen (ADVIL) 800 MG tablet, Take 1 tablet (800 mg total) by mouth 3 (three) times daily with meals., Disp: 90 tablet, Rfl: 1 .  levocetirizine (XYZAL) 5 MG tablet, Take 5 mg by mouth daily. , Disp: , Rfl:  .  metFORMIN (GLUCOPHAGE) 500 MG tablet, Take 1 tablet (500 mg total) by mouth 2 (two) times daily with a meal. (Patient taking differently: Take 500 mg by mouth daily with breakfast. ), Disp: 30 tablet, Rfl: 2 .  sertraline (ZOLOFT)  50 MG tablet, TAKE 1 TABLET BY MOUTH EVERY DAY, Disp: 90 tablet, Rfl: 1 .  Tetrahydrozoline HCl (VISINE OP), Place 1 drop into both eyes daily as needed (allergies). , Disp: , Rfl:  .  ZINC-VITAMIN C PO, Take 1 tablet by mouth daily., Disp: , Rfl:  .  Albuterol Sulfate (PROAIR RESPICLICK) 858 (90 Base) MCG/ACT AEPB, Inhale 2 puffs into the lungs every 4 (four) hours as needed (shortness of breath).  (Patient not taking: Reported on 07/23/2020), Disp: , Rfl:  .  Blood Glucose Monitoring Suppl (BLOOD GLUCOSE MONITOR SYSTEM) w/Device KIT, 1 each by Does not apply route daily., Disp: 1 kit, Rfl: 0 .  gabapentin (NEURONTIN) 300 MG capsule, Take 1 capsule (300 mg total) by mouth at bedtime. (Patient not taking: Reported on 07/23/2020), Disp: 90 capsule, Rfl: 5    Allergies  Allergen Reactions  . Amoxicillin Other (See Comments)    Inflammation in pancreas.  Did it involve swelling of the face/tongue/throat, SOB, or low BP? No Did it involve sudden or severe rash/hives, skin peeling, or any reaction on the inside of your mouth or nose? No Did you need to seek medical attention at a hospital or doctor's office? Yes When did it last happen?5-6 years ago If all above answers are "NO", may proceed with cephalosporin use.      Review of Systems  Constitutional: Negative.   Respiratory: Negative.   Cardiovascular: Negative.  Negative for chest pain, palpitations and leg swelling.  Psychiatric/Behavioral: Negative.      Today's Vitals   07/23/20 1640  BP: 140/82  Pulse: 83  Temp: 97.7 F (36.5 C)  Weight: 180 lb (81.6 kg)  Height: 5' 2"  (1.575 m)  PainSc: 0-No pain   Body mass index is 32.92 kg/m.   Objective:  Physical Exam Vitals reviewed.  Constitutional:      General: She is not in acute distress.    Appearance: Normal appearance. She is well-developed. She is obese.  HENT:     Head: Normocephalic and atraumatic.  Eyes:     Pupils: Pupils are equal, round, and reactive to light.  Cardiovascular:     Rate and Rhythm: Normal rate and regular rhythm.     Pulses: Normal pulses.     Heart sounds: Normal heart sounds. No murmur heard.   Pulmonary:     Effort: Pulmonary effort is normal. No respiratory distress.     Breath sounds: Normal breath sounds. No wheezing.  Skin:    General: Skin is warm and dry.     Capillary Refill: Capillary refill takes less than 2 seconds.  Neurological:     General: No focal deficit present.     Mental Status: She is alert and oriented to person, place, and time.     Cranial Nerves: No cranial nerve deficit.  Psychiatric:        Mood and Affect: Mood normal.        Behavior: Behavior normal.        Thought Content: Thought content normal.        Judgment: Judgment normal.          Assessment And Plan:     1. Prediabetes  Chronic,   I have ordered a glucometer once she receives she will call to schedule a nurse visit for teaching of glucometer  She is to take metformin twice a day if continues with GI upset return call to office - BMP8+eGFR - Hemoglobin A1c - Blood Glucose Monitoring Suppl (BLOOD  GLUCOSE MONITOR SYSTEM) w/Device KIT; 1 each by Does not apply route daily.  Dispense: 1 kit; Refill: 0  2. Essential hypertension  Chronic, fair control  Continue with current medications  3. Mixed hyperlipidemia  Chronic, stable  May need to add lipid panel to labs tomorrow  Continue to avoid fried and fatty foods     Patient was given opportunity to ask questions. Patient verbalized understanding of the plan and was able to repeat key elements of the plan. All questions were answered to their satisfaction.  Minette Brine, FNP   I, Minette Brine, FNP, have reviewed all documentation for this visit. The documentation on 07/23/20 for the exam, diagnosis, procedures, and orders are all accurate and complete.  THE PATIENT IS ENCOURAGED TO PRACTICE SOCIAL DISTANCING DUE TO THE COVID-19 PANDEMIC.

## 2020-07-24 LAB — BMP8+EGFR
BUN/Creatinine Ratio: 14 (ref 12–28)
BUN: 12 mg/dL (ref 8–27)
CO2: 24 mmol/L (ref 20–29)
Calcium: 9.9 mg/dL (ref 8.7–10.3)
Chloride: 102 mmol/L (ref 96–106)
Creatinine, Ser: 0.85 mg/dL (ref 0.57–1.00)
GFR calc Af Amer: 84 mL/min/{1.73_m2} (ref >59)
GFR calc non Af Amer: 73 mL/min/{1.73_m2} (ref >59)
Glucose: 94 mg/dL (ref 65–99)
Potassium: 4.1 mmol/L (ref 3.5–5.2)
Sodium: 141 mmol/L (ref 134–144)

## 2020-07-24 LAB — HEMOGLOBIN A1C
Est. average glucose Bld gHb Est-mCnc: 123 mg/dL
Hgb A1c MFr Bld: 5.9 % — ABNORMAL HIGH (ref 4.8–5.6)

## 2020-07-30 ENCOUNTER — Ambulatory Visit: Payer: BC Managed Care – PPO | Admitting: Orthopedic Surgery

## 2020-07-31 ENCOUNTER — Encounter: Payer: Self-pay | Admitting: Orthopedic Surgery

## 2020-07-31 ENCOUNTER — Ambulatory Visit (INDEPENDENT_AMBULATORY_CARE_PROVIDER_SITE_OTHER): Payer: BC Managed Care – PPO | Admitting: Orthopedic Surgery

## 2020-07-31 ENCOUNTER — Other Ambulatory Visit: Payer: Self-pay

## 2020-07-31 VITALS — BP 172/98 | HR 95 | Ht 62.0 in | Wt 174.0 lb

## 2020-07-31 DIAGNOSIS — M47812 Spondylosis without myelopathy or radiculopathy, cervical region: Secondary | ICD-10-CM

## 2020-07-31 DIAGNOSIS — Z9889 Other specified postprocedural states: Secondary | ICD-10-CM | POA: Diagnosis not present

## 2020-07-31 MED ORDER — OXYCODONE-ACETAMINOPHEN 5-325 MG PO TABS: 1.0000 | ORAL_TABLET | Freq: Three times a day (TID) | ORAL | 0 refills | Status: AC | PRN

## 2020-07-31 NOTE — Progress Notes (Signed)
No chief complaint on file.   Encounter Diagnoses  Name Primary?  . Cervical spondylosis without myelopathy Yes  . S/P arthroscopy of right shoulder 01/09/19     History: 64 year old female status post arthroscopy right shoulder with debridement and biceps tenotomy, ongoing treatment for cervical spondylosis without myelopathy presents for follow-up complains of pain cervical spine right shoulder, seems to be chronic dull aching pain  She is able to abduct 90 degrees flexion is limited to 130 degrees mild weakness in the supraspinatus tendon with normal internal and external rotation  She can work on a mat to give her back some relief   Pain med occasional   Meds ordered this encounter  Medications  . oxyCODONE-acetaminophen (PERCOCET/ROXICET) 5-325 MG tablet    Sig: Take 1 tablet by mouth every 8 (eight) hours as needed for up to 5 days for severe pain.    Dispense:  15 tablet    Refill:  0   Fu 2 months

## 2020-07-31 NOTE — Patient Instructions (Signed)
Note for standing on a mat at work

## 2020-09-08 ENCOUNTER — Other Ambulatory Visit: Payer: Self-pay | Admitting: Nurse Practitioner

## 2020-09-08 DIAGNOSIS — R7303 Prediabetes: Secondary | ICD-10-CM

## 2020-10-02 ENCOUNTER — Ambulatory Visit: Payer: Self-pay | Admitting: Orthopedic Surgery

## 2020-10-02 ENCOUNTER — Telehealth: Payer: Self-pay | Admitting: Orthopedic Surgery

## 2020-10-02 NOTE — Telephone Encounter (Signed)
Patient canceled appointment for today due to her aunt's death and also the fact that she has not had her injections yet.  She will call back to reschedule her appointment

## 2020-10-10 ENCOUNTER — Ambulatory Visit: Payer: BC Managed Care – PPO | Admitting: Cardiology

## 2020-10-14 ENCOUNTER — Telehealth: Payer: Self-pay | Admitting: Orthopedic Surgery

## 2020-10-14 NOTE — Telephone Encounter (Signed)
Yes ok to see her before the injections if she wants

## 2020-10-14 NOTE — Telephone Encounter (Signed)
Called patient regarding form received via fax from [employer] [facility] disability insurance department; left message for patient to return call.

## 2020-10-14 NOTE — Telephone Encounter (Signed)
Patient called regarding her follow up appointment which she is rescheduling from 21-Oct-2020 due to death in family, and is asking if she needs to wait to see her provider regarding her injections, which is not until 11/17/20. Okay to see Dr Aline Brochure prior to that?

## 2020-10-14 NOTE — Telephone Encounter (Signed)
Called back to patient; done; patient aware.

## 2020-10-16 ENCOUNTER — Other Ambulatory Visit: Payer: Self-pay | Admitting: Cardiology

## 2020-10-16 ENCOUNTER — Encounter: Payer: Self-pay | Admitting: Orthopedic Surgery

## 2020-10-16 DIAGNOSIS — I10 Essential (primary) hypertension: Secondary | ICD-10-CM

## 2020-10-16 DIAGNOSIS — I471 Supraventricular tachycardia: Secondary | ICD-10-CM

## 2020-10-17 ENCOUNTER — Other Ambulatory Visit: Payer: Self-pay

## 2020-10-17 DIAGNOSIS — I471 Supraventricular tachycardia, unspecified: Secondary | ICD-10-CM

## 2020-10-17 DIAGNOSIS — I10 Essential (primary) hypertension: Secondary | ICD-10-CM

## 2020-10-17 MED ORDER — DILTIAZEM HCL ER COATED BEADS 240 MG PO CP24: 240.0000 mg | ORAL_CAPSULE | Freq: Every day | ORAL | 3 refills | Status: DC

## 2020-10-22 ENCOUNTER — Encounter: Payer: Self-pay | Admitting: Orthopedic Surgery

## 2020-10-22 ENCOUNTER — Encounter: Payer: BC Managed Care – PPO | Admitting: Nurse Practitioner

## 2020-10-22 ENCOUNTER — Ambulatory Visit (INDEPENDENT_AMBULATORY_CARE_PROVIDER_SITE_OTHER): Payer: Self-pay | Admitting: Orthopedic Surgery

## 2020-10-22 ENCOUNTER — Other Ambulatory Visit: Payer: Self-pay

## 2020-10-22 VITALS — BP 133/83 | HR 66 | Ht 62.0 in | Wt 172.0 lb

## 2020-10-22 DIAGNOSIS — M47812 Spondylosis without myelopathy or radiculopathy, cervical region: Secondary | ICD-10-CM

## 2020-10-22 MED ORDER — DIAZEPAM 5 MG PO TABS: 5.0000 mg | ORAL_TABLET | Freq: Four times a day (QID) | ORAL | 0 refills | Status: AC | PRN

## 2020-10-22 MED ORDER — PREDNISONE 10 MG PO TABS: 10.0000 mg | ORAL_TABLET | Freq: Three times a day (TID) | ORAL | 0 refills | Status: AC

## 2020-10-22 NOTE — Patient Instructions (Signed)
Stop sertraline while taking valium

## 2020-10-22 NOTE — Progress Notes (Signed)
Chief Complaint  Patient presents with  . Shoulder Pain    Rt shoulder pain getting worse over past month.   65 year old female with persistent right neck and shoulder pain after arthroscopic shoulder surgery with biceps tenotomy and extensive debridement back in May 2020.  Postoperatively she had significant difficulties in regaining range of motion and persistent pain in the biceps bicipital groove but also developed significant cervical pain radiating to the right shoulder complicating her diagnosis.    :Operative note PRE-OPERATIVE DIAGNOSIS:  torn rotator cuff right shoulder   POST-OPERATIVE DIAGNOSIS:  partial rotator cuff tear right shoulder, partial right biceps tendon tear   PROCEDURE:  Procedure(s): SHOULDER ARTHROSCOPY WITH biceps tenotomy, EXTENSIVE debriedment (Right) -06269   FINDINGS: INTRA AND EXTRA ARTICULAR TEAR RIGHT BICEPS TENDON, PARTIAL TEAR  ARTICULAR SURFACE SUPRASPINATUS  RIGHT SHOULDER  Posterior labral tear.     EXAM: MRI CERVICAL SPINE WITHOUT CONTRAST October 2020   TECHNIQUE: Multiplanar, multisequence MR imaging of the cervical spine was performed. No intravenous contrast was administered.   COMPARISON:  Cervical spine x-rays dated February 01, 2018. MRI cervical spine dated August 04, 2005.   FINDINGS: Alignment: Straightening of the normal cervical lordosis. Sagittal alignment is maintained.   Vertebrae: No fracture, evidence of discitis, or bone lesion.   Cord: Normal signal and morphology.   Posterior Fossa, vertebral arteries, paraspinal tissues: Negative.   Disc levels:   C2-C3: Negative disc. New moderate left facet arthropathy. New mild to moderate left neuroforaminal stenosis. No spinal canal or right neuroforaminal stenosis.   C3-C4: No significant disc bulge or herniation. New mild bilateral facet arthropathy. No stenosis.   C4-C5: Mild disc degeneration without significant disc bulge or herniation. New moderate right and mild  left facet arthropathy. New mild right greater than left neuroforaminal stenosis. No spinal canal stenosis.   C5-C6: Mild disc degeneration without significant disc bulge or herniation. Unchanged mild left uncovertebral hypertrophy and mild bilateral facet arthropathy. Unchanged mild left neuroforaminal stenosis. No spinal canal or right neuroforaminal stenosis.   C6-C7: Negative disc. Unchanged mild bilateral facet arthropathy. No stenosis.   C7-T1: Negative disc. Unchanged mild bilateral facet arthropathy. No stenosis.   IMPRESSION: 1. New mild-to-moderate left neuroforaminal stenosis at C2-C3 and mild right greater than left neuroforaminal stenosis at C4-C5 due to progressive facet arthropathy. 2. Unchanged mild left neuroforaminal stenosis at C5-C6 due to facet uncovertebral hypertrophy.     Electronically Signed   By: Titus Dubin M.D.   On: 05/30/2019 10:30   Repeat MRI March 2021 FINDINGS: Rotator cuff: Moderate supraspinatus tendinosis with continued low-grade partial-thickness articular surface tear of the mid to distal tendon. Unchanged moderate distal infraspinatus tendinosis. Unchanged mild subscapularis tendinosis. The teres minor tendon is unremarkable.   Muscles: New intramuscular ganglion cyst within the supraspinatus muscle measuring approximately 3.8 cm in length. Resolved tear along the infraspinatus myotendinous junction with associated intramuscular ganglion cyst. No muscle edema or atrophy.   Biceps long head:  Interval biceps tenotomy.   Acromioclavicular Joint: Mild arthropathy of the acromioclavicular joint. Type I acromion. Trace subacromial/subdeltoid bursal fluid.   Glenohumeral Joint: No joint effusion. No chondral defect.   Labrum: Grossly intact, but evaluation is limited by lack of intraarticular fluid.   Bones: Increased reactive subcortical cystic change in the anterior greater tuberosity. No acute fracture or dislocation. No  suspicious bone lesion.   Other: None.   IMPRESSION: 1. Moderate supraspinatus tendinosis with continued low-grade partial-thickness articular surface tear of the mid to distal tendon. New 3.8  cm intramuscular ganglion cyst within the supraspinatus muscle. 2. Unchanged moderate distal infraspinatus tendinosis. Resolved tear along the infraspinatus myotendinous junction with associated intramuscular ganglion cyst. 3. Interval biceps tenotomy. 4. Unchanged mild acromioclavicular osteoarthritis.     Electronically Signed   By: Titus Dubin M.D.   On: 10/25/2019 10:45   Today Kathryn Gallegos is having significant shoulder pain and pain and spasm in the upper trapezius muscle which radiates to the right shoulder with occasional shooting pain into the right biceps  We have given her several bicipital groove injections with moderate relief  Today it seems on exam that the neck and trapezius muscle are bothering her the most ongoing I put her on 3 days of prednisone and 3 days of Valium to try to get rid of the muscle spasms and get her more pain relief    Meds ordered this encounter  Medications  . diazepam (VALIUM) 5 MG tablet    Sig: Take 1 tablet (5 mg total) by mouth every 6 (six) hours as needed for up to 3 days for anxiety.    Dispense:  12 tablet    Refill:  0  . predniSONE (DELTASONE) 10 MG tablet    Sig: Take 1 tablet (10 mg total) by mouth 3 (three) times daily for 3 days.    Dispense:  9 tablet    Refill:  0

## 2020-11-07 ENCOUNTER — Ambulatory Visit: Payer: Self-pay | Admitting: Cardiology

## 2020-11-19 ENCOUNTER — Ambulatory Visit: Payer: Self-pay | Admitting: Cardiology

## 2020-11-21 ENCOUNTER — Telehealth: Payer: Self-pay | Admitting: Orthopedic Surgery

## 2020-11-21 NOTE — Telephone Encounter (Signed)
I have called [employer] [facility] and obtained a current signed release for [place] to send info.  I have asked them to fax [place] exactly what the need, they have and I have submitted it via fax for patient.  [employer] rep Izora Gala has also sent in notes that were requested from them.  I called patient LM advising that I was sending all this in for her.

## 2020-11-21 NOTE — Telephone Encounter (Signed)
Spoke with patient 11/20/20-states made aware of her medical records request and form from [facility] not being received; patient is very concerned as states she had paid the $25.00. Upon further checking, our medical records and forms [employer] had been mailed to patient, due to authorization attached was out of date. I relayed this information to patient, and also that I spoke with the [facility], as well as acknowledging their updated request form received in fax, which did include an updated authorization. Patient requesting all to be sent to the [facility] by MON, 11/24/20. I faxed the request to [employer] 11/20/20 as "URGENT", and it was sent via interoffice (new process) this morning, 11/21/20, although fax should have been received 4/7*email sent to [employer] office/attention Izora Gala to follow up. Abigail Butts aware and will follow up in order for patient's request to be completed promptly.

## 2020-11-24 NOTE — Telephone Encounter (Signed)
Patient followed up per [facility] message left for her on Friday assuring her that the [facility] request was being taken care of. I verified sent per notes under Release of Information. States she appreciates all that was done to facilitate her records request to the [facility].

## 2020-12-04 ENCOUNTER — Ambulatory Visit: Payer: Self-pay | Admitting: Orthopedic Surgery

## 2020-12-11 ENCOUNTER — Ambulatory Visit: Payer: BC Managed Care – PPO | Admitting: Orthopedic Surgery

## 2020-12-11 ENCOUNTER — Encounter: Payer: Self-pay | Admitting: Orthopedic Surgery

## 2020-12-11 ENCOUNTER — Other Ambulatory Visit: Payer: Self-pay

## 2020-12-11 VITALS — BP 127/89 | HR 73 | Ht 62.0 in

## 2020-12-11 DIAGNOSIS — M5030 Other cervical disc degeneration, unspecified cervical region: Secondary | ICD-10-CM | POA: Insufficient documentation

## 2020-12-11 DIAGNOSIS — M542 Cervicalgia: Secondary | ICD-10-CM

## 2020-12-11 DIAGNOSIS — G8929 Other chronic pain: Secondary | ICD-10-CM

## 2020-12-11 MED ORDER — CELECOXIB 200 MG PO CAPS: 200.0000 mg | ORAL_CAPSULE | Freq: Two times a day (BID) | ORAL | 2 refills | Status: DC

## 2020-12-11 NOTE — Patient Instructions (Addendum)
Follow up with the Neurosurgeon regarding the neck pain  Stop the Ibuprofen and stop the Gabapentin   Flexeril Take  celebrex take twice a day    No work

## 2020-12-11 NOTE — Progress Notes (Signed)
Chief Complaint  Patient presents with  . Neck Pain    Neck pain, Patient reports that she is worse since the last time.    65 year old female with persistent right neck and shoulder pain after arthroscopic surgery with biceps tenotomy extensive debridement back in May 2020  Postoperatively she had significant difficulties regaining motion and persistent pain in the biceps bicipital groove and developed cervical pain radiating to the right shoulder and arm  She is currently undergoing injections in the cervical region to try to alleviate her cervical axial pain  He had an episode of increased pain which is progressed over the last week or so she still on Flexeril and Zoloft which she uses to help her sleep she could not take the gabapentin as it made her too drowsy.  Her Advil does not seem to help  Encounter Diagnosis  Name Primary?  . Neck pain, chronic Yes    I would recommend that she follow-up for the postinjection evaluation  I would start her on Celebrex 200 mg twice a day   F/u 3 mos   She can not work in her current condition    BP 127/89   Pulse 73   Ht 5\' 2"  (1.575 m)   BMI 31.46 kg/m   Meds ordered this encounter  Medications  . celecoxib (CELEBREX) 200 MG capsule    Sig: Take 1 capsule (200 mg total) by mouth 2 (two) times daily.    Dispense:  60 capsule    Refill:  2

## 2020-12-18 ENCOUNTER — Ambulatory Visit: Payer: BC Managed Care – PPO | Admitting: Cardiology

## 2020-12-18 ENCOUNTER — Encounter: Payer: Self-pay | Admitting: Cardiology

## 2020-12-18 ENCOUNTER — Other Ambulatory Visit: Payer: Self-pay

## 2020-12-18 VITALS — BP 128/72 | HR 74 | Temp 97.1°F | Resp 16 | Ht 62.0 in | Wt 171.2 lb

## 2020-12-18 DIAGNOSIS — I471 Supraventricular tachycardia, unspecified: Secondary | ICD-10-CM

## 2020-12-18 DIAGNOSIS — E7800 Pure hypercholesterolemia, unspecified: Secondary | ICD-10-CM

## 2020-12-18 DIAGNOSIS — I10 Essential (primary) hypertension: Secondary | ICD-10-CM

## 2020-12-18 MED ORDER — ROSUVASTATIN CALCIUM 10 MG PO TABS
10.0000 mg | ORAL_TABLET | Freq: Every day | ORAL | 3 refills | Status: DC
Start: 2020-12-18 — End: 2022-02-19

## 2020-12-18 NOTE — Progress Notes (Signed)
Primary Physician/Referring:  Minette Brine, FNP  Patient ID: Kathryn Gallegos, female    DOB: 30-Jul-1956, 65 y.o.   MRN: [id number]  Chief Complaint  Patient presents with  . Supraventricular tachycardia  . Hypertension  . Follow-up    1 year   HPI:    Kathryn Gallegos  is a 65 y.o.  female  with  hypertension, bronchial ashtma, hyperlipidemia, and hyperglycemia seen in the emergency room on 05/07/2017 with SVT at the rate of 172 bpm with secondary ST-T wave changes in the inferior and lateral leads suggestive of ischemia. She converted to sinus rhythm with adenosine was and discharged home. Was evaluated by EP and she preferred medical therapy due to fear of pacemaker implantation. Now on Diltiazem and states she has not had any further episodes.   Patient presents for annual follow-up.  Continues to do well without breakthrough episodes of SVT in the last 1 year.  She does state she has had occasional brief episodes of right-sided chest discomfort after she eats relieved with belching or a bowel movement.  She does have a known history of GERD for which she takes Nexium.  Patient does not monitor her blood pressure on a regular basis at home.  At last visit she was advised to hold statin in view of recent shoulder injury, however she has not resumed statin therapy at this time.  She is now following with orthopedic for management of neck and shoulder pain, which is relieved by Celebrex.  Patient admitts to poor diet compliance and high salt intake.  Denies dyspnea, palpitations, dizziness, syncope, near syncope.  Notably patient has not had recent lipid profile testing done.  She walks several blocks per day without issue.  Past Medical History:  Diagnosis Date  . Anxiety   . Anxiety and depression   . Asthma   . Borderline diabetes   . Carpal tunnel syndrome 07/05/2007   Qualifier: Diagnosis of  By: [name], [name]    . Dyspnea on exertion 09/22/2018  . HIGH BLOOD PRESSURE  07/04/2007   Qualifier: Diagnosis of  By: [name], [name]    . HTN (hypertension)   . Hyperglycemia   . Hyperlipidemia   . IMPINGEMENT SYNDROME 03/09/2010   Qualifier: Diagnosis of  By: [name], [name]    . Obesity   . PSVT (paroxysmal supraventricular tachycardia) ([place]) 09/22/2018  . Pure hypercholesterolemia 09/22/2018  . Rapid palpitations   . Seasonal allergies    Past Surgical History:  Procedure Laterality Date  . BREAST BIOPSY Left   . SHOULDER ARTHROSCOPY WITH BICEPSTENOTOMY Right 01/09/2019   Procedure: SHOULDER ARTHROSCOPY WITH biceps tenotomy, limited debriedment;  Surgeon: [name], MD;  Location: [facility];  Service: Orthopedics;  Laterality: Right;  . TOTAL ABDOMINAL HYSTERECTOMY    . TUBAL LIGATION     Family History  Problem Relation Age of Onset  . Pancreatic cancer Mother   . Esophageal cancer Maternal Grandmother   . Heart disease Maternal Grandfather   . Heart disease Maternal Uncle   . Colon cancer Maternal Aunt   . Stomach cancer Maternal Aunt   . Arthritis Other   . Cancer Other   . Diabetes Other   . Breast cancer Neg Hx     Social History   Tobacco Use  . Smoking status: Former Smoker    Packs/day: 0.25    Years: 2.00    Pack years: 0.50    Types: Cigarettes    Quit date: 1970  Years since quitting: 52.3  . Smokeless tobacco: Never Used  Substance Use Topics  . Alcohol use: No   ROS  Review of Systems  Constitutional: Negative for malaise/fatigue and weight gain.  Cardiovascular: Positive for dyspnea on exertion (mild, chornic and stable). Negative for chest pain, claudication, leg swelling, near-syncope, orthopnea, palpitations, paroxysmal nocturnal dyspnea and syncope.  Respiratory: Negative for shortness of breath.   Hematologic/Lymphatic: Does not bruise/bleed easily.  Musculoskeletal: Positive for back pain and joint pain (shoulder).  Gastrointestinal: Negative for melena.  Neurological: Negative for dizziness and  weakness.   Objective  Blood pressure 128/72, pulse 74, temperature (!) 97.1 F (36.2 C), temperature source Temporal, resp. rate 16, height _0  (1.575 m), weight 171 lb 3.2 oz (77.7 kg), SpO2 94 %.  Vitals with BMI 12/18/2020 12/11/2020 10/22/2020  Height _1  _2  _3   Weight 171 lbs 3 oz (No Data) 172 lbs  BMI 00.51 - 10.21  Systolic 117 356 701  Diastolic 72 89 83  Pulse 74 73 66     Physical Exam Vitals reviewed.  Constitutional:      Appearance: She is obese.  HENT:     Head: Normocephalic and atraumatic.  Cardiovascular:     Rate and Rhythm: Normal rate and regular rhythm.     Pulses: Intact distal pulses.     Heart sounds: S1 normal and S2 normal. Murmur heard.   Midsystolic murmur is present with a grade of 2/6 at the upper right sternal border. No gallop.      Comments: No leg edema, no JVD. Pulmonary:     Effort: Pulmonary effort is normal. No respiratory distress.     Breath sounds: Normal breath sounds. No wheezing, rhonchi or rales.  Abdominal:     General: Bowel sounds are normal.     Palpations: Abdomen is soft.  Musculoskeletal:     Right lower leg: No edema.     Left lower leg: No edema.  Neurological:     Mental Status: She is alert.    Laboratory examination:   Recent Labs    07/23/20 1727  NA 141  K 4.1  CL 102  CO2 24  GLUCOSE 94  BUN 12  CREATININE 0.85  CALCIUM 9.9  GFRNONAA 73  GFRAA 84   CrCl cannot be calculated (Patient's most recent lab result is older than the maximum 21 days allowed.).  CMP Latest Ref Rng & Units 07/23/2020 10/17/2019 01/05/2019  Glucose 65 - 99 mg/dL 94 106(H) 110(H)  BUN 8 - 27 mg/dL _4 Creatinine 0.57 - 1.00 mg/dL 0.85 0.85 0.69  Sodium 134 - 144 mmol/L 141 145(H) 138  Potassium 3.5 - 5.2 mmol/L 4.1 4.1 3.3(L)  Chloride 96 - 106 mmol/L 102 105 102  CO2 20 - 29 mmol/L _5 Calcium 8.7 - 10.3 mg/dL 9.9 9.5 9.0  Total Protein 6.0 - 8.5 g/dL - 7.3 -  Total Bilirubin 0.0 - 1.2 mg/dL - 0.3 -   Alkaline Phos 39 - 117 IU/L - 144(H) -  AST 0 - 40 IU/L - 17 -  ALT 0 - 32 IU/L - 23 -   CBC Latest Ref Rng & Units 04/16/2020 10/17/2019 01/05/2019  WBC 3.4 - 10.8 x10E3/uL 7.2 6.7 6.6  Hemoglobin 11.1 - 15.9 g/dL 15.3 14.9 14.7  Hematocrit 34.0 - 46.6 % 44.0 45.1 44.7  Platelets 150 - 450 x10E3/uL 227 212 216   Lipid Panel     Component Value  Date/Time   CHOL 173 10/17/2019 1130   TRIG 75 10/17/2019 1130   HDL 67 10/17/2019 1130   CHOLHDL 2.6 10/17/2019 1130   LDLCALC 92 10/17/2019 1130   HEMOGLOBIN A1C Lab Results  Component Value Date   HGBA1C 5.9 (H) 07/23/2020   TSH Recent Labs    04/16/20 1515  TSH 0.651    External labs:   09/29/2017:  Hemoglobin A1c 5.7%.  TSH normal.  Vitamin D normal.  CBC normal.  Creatinine 0.7, EGFR 84/97, potassium 4.3, CMP normal.  Cholesterol 192, triglycerides 92, HDL 58, LDL 116.  Medications and allergies   Allergies  Allergen Reactions  . Amoxicillin Other (See Comments)    Inflammation in pancreas.  Did it involve swelling of the face/tongue/throat, SOB, or low BP? No Did it involve sudden or severe rash/hives, skin peeling, or any reaction on the inside of your mouth or nose? No Did you need to seek medical attention at a hospital or doctor's office? Yes When did it last happen?5-6 years ago If all above answers are "NO", may proceed with cephalosporin use.      Current Outpatient Medications  Medication Instructions  . acetaminophen (TYLENOL) 1,300 mg, Oral, Every 8 hours PRN  . Apple Cider Vinegar 600 MG CAPS 1 capsule, Oral, Daily  . Blood Glucose Monitoring Suppl (BLOOD GLUCOSE MONITOR SYSTEM) w/Device KIT 1 each, Does not apply, Daily  . CALCIUM CITRATE PO 1 capsule, Oral, Daily  . celecoxib (CELEBREX) 200 mg, Oral, 2 times daily  . cyclobenzaprine (FLEXERIL) 5 mg, Oral, 2 times daily  . diltiazem (CARDIZEM CD) 240 MG 24 hr capsule TAKE 1 CAPSULE BY MOUTH EVERY DAY  . esomeprazole (NEXIUM) 20 mg, Oral, Daily PRN   . fluticasone (FLONASE) 50 MCG/ACT nasal spray 1 spray, Each Nare, Daily PRN  . levocetirizine (XYZAL) 5 mg, Oral, Daily  . NON FORMULARY 1 capsule, Oral, Daily, [employer]  . NON FORMULARY No dose, route, or frequency recorded.  . NON FORMULARY 1 capsule, Oral, See admin instructions, 7 Dry Cleanse  . Probiotic Product (RA PROBIOTIC COMPLEX) CAPS 1 capsule, Oral, Daily  . rosuvastatin (CRESTOR) 10 mg, Oral, Daily  . sertraline (ZOLOFT) 50 MG tablet TAKE 1 TABLET BY MOUTH EVERY DAY  . Tetrahydrozoline HCl (VISINE OP) 1 drop, Both Eyes, Daily PRN  . Vitamin D3 5,000 Units, Oral, Daily  . ZINC-VITAMIN C PO 1 tablet, Oral, Daily    Radiology:   No results found.  Cardiac Studies:   Exercise sestamibi stress test 07/21/2018:  1. The patient performed treadmill exercise using Bruce protocol, completing 6:45 minutes. The patient completed an estimated workload of 8.2 METS, reaching 87% of the maximum predicted heart rate. Resting hypertension 150/100 mmHg with exaggerated exercise BP response, peak BP 230/110 mmHg. No stress symptoms reported. Exercise capacity was normal. No ischemic changes seen on stress electrocardiogram.   2. The overall quality of the study is excellent. There is no evidence of abnormal lung activity. Stress and rest SPECT images demonstrate homogeneous tracer distribution throughout the myocardium. Gated SPECT imaging reveals normal myocardial thickening and wall motion. The left ventricular ejection fraction was normal (73%).  3. Low risk study.  Echocardiogram 07/05/2018: Left ventricle cavity is normal in size. Mild concentric hypertrophy of the left ventricle. Normal global wall motion. Doppler evidence of grade I (impaired) diastolic dysfunction, normal LAP. Calculated EF 67%. Mild (Grade I) mitral regurgitation. Mild tricuspid regurgitation.  No evidence of pulmonary hypertension.  EKG   EKG 12/18/2020: Normal  sinus rhythm at a rate of 70 bpm.  Normal  axis.  Incomplete right bundle branch block.  Poor R wave progression, cannot exclude anteroseptal infarct old.  Compared to EKG 10/11/2019, PRWP new.  EKG 10/10/2018: Normal sinus rhythm at rate of 71 bpm, normal axis, no evidence of ischemia, normal EKG.   No significant change from  EKG 05/14/2017: Normal sinus rhythm at the rate of 71 bpm, normal intervals.  No evidence of ischemia, normal EKG.   Assessment     ICD-10-CM   1. Supraventricular tachycardia, paroxysmal (HCC)  I47.1 EKG 12-Lead  2. Essential hypertension  I10   3. Pure hypercholesterolemia  E78.00     Meds ordered this encounter  Medications  . rosuvastatin (CRESTOR) 10 MG tablet    Sig: Take 1 tablet (10 mg total) by mouth daily.    Dispense:  90 tablet    Refill:  3    Medications Discontinued During This Encounter  Medication Reason  . Albuterol Sulfate (PROAIR RESPICLICK) 211 (90 Base) MCG/ACT AEPB Error  . diltiazem (CARDIZEM CD) 240 MG 24 hr capsule Error  . metFORMIN (GLUCOPHAGE) 500 MG tablet Error  . oxyCODONE-acetaminophen (PERCOCET/ROXICET) 5-325 MG tablet Error  . HYDROcodone-acetaminophen (NORCO/VICODIN) 5-325 MG tablet Error     Recommendations:   TVISHA SCHWOERER  is a 65 y.o.  female  with  hypertension, bronchial ashtma, hyperlipidemia, and hyperglycemia seen in the emergency room on 05/07/2017 with SVT at the rate of 172 bpm with secondary ST-T wave changes in the inferior and lateral leads suggestive of ischemia. She converted to sinus rhythm with adenosine was and discharged home. Was evaluated by EP and she preferred medical therapy due to fear of pacemaker implantation. Now on Diltiazem and states she has not had any further episodes.  Patient presents for annual follow-up.  She has had no breakthrough episodes of SVT since visit last year.  Again discussed Valsalva maneuver when needed, patient verbalized understanding agreement.  Pressure was initially elevated in the office today, it is well  controlled upon recheck.  Patient has not had recent lipid profile testing done, however she states she plans to see her PCP next month, therefore we will hold off on ordering lipid profile testing at this time.  However if she has not had repeat testing done by next visit we will obtain at this time.  In view of history of hypercholesterolemia advised patient to resume rosuvastatin and milligrams daily at this time.  Discussed at length with patient regarding diet and lifestyle modifications as well as weight loss in order to reduce overall cardiovascular risk and improve blood pressure control.  Patient had several questions regarding diet modifications, all were addressed to her satisfaction.  Follow-up in 1 year, sooner if needed, for SVT, hyperlipidemia, hypertension.   This was a 35-minute encounter with face-to-face counseling, medical records review, coordination of care, explanation of complex medical issues, complex medical decision making.  Significant amount of time was spent during this visit counseling patient regarding heart healthy and low-sodium diet as well as weight loss.   Alethia Berthold, PA-C 12/18/2020, 4:09 PM Office: [phone]

## 2020-12-18 NOTE — Patient Instructions (Signed)
Heart-Healthy Eating Plan Many factors influence your heart (coronary) health, including eating and exercise habits. Coronary risk increases with abnormal blood fat (lipid) levels. Heart-healthy meal planning includes limiting unhealthy fats, increasing healthy fats, and making other diet and lifestyle changes. What is my plan? Your health care provider may recommend that you:  Limit your fat intake to _________% or less of your total calories each day.  Limit your saturated fat intake to _________% or less of your total calories each day.  Limit the amount of cholesterol in your diet to less than _________ mg per day. What are tips for following this plan? Cooking Cook foods using methods other than frying. Baking, boiling, grilling, and broiling are all good options. Other ways to reduce fat include:  Removing the skin from poultry.  Removing all visible fats from meats.  Steaming vegetables in water or broth. Meal planning  At meals, imagine dividing your plate into fourths: ? Fill one-half of your plate with vegetables and green salads. ? Fill one-fourth of your plate with whole grains. ? Fill one-fourth of your plate with lean protein foods.  Eat 4-5 servings of vegetables per day. One serving equals 1 cup raw or cooked vegetable, or 2 cups raw leafy greens.  Eat 4-5 servings of fruit per day. One serving equals 1 medium whole fruit,  cup dried fruit,  cup fresh, frozen, or canned fruit, or  cup 100% fruit juice.  Eat more foods that contain soluble fiber. Examples include apples, broccoli, carrots, beans, peas, and barley. Aim to get 25-30 g of fiber per day.  Increase your consumption of legumes, nuts, and seeds to 4-5 servings per week. One serving of dried beans or legumes equals  cup cooked, 1 serving of nuts is  cup, and 1 serving of seeds equals 1 tablespoon.   Fats  Choose healthy fats more often. Choose monounsaturated and polyunsaturated fats, such as olive and  canola oils, flaxseeds, walnuts, almonds, and seeds.  Eat more omega-3 fats. Choose salmon, mackerel, sardines, tuna, flaxseed oil, and ground flaxseeds. Aim to eat fish at least 2 times each week.  Check food labels carefully to identify foods with trans fats or high amounts of saturated fat.  Limit saturated fats. These are found in animal products, such as meats, butter, and cream. Plant sources of saturated fats include palm oil, palm kernel oil, and coconut oil.  Avoid foods with partially hydrogenated oils in them. These contain trans fats. Examples are stick margarine, some tub margarines, cookies, crackers, and other baked goods.  Avoid fried foods. General information  Eat more home-cooked food and less restaurant, buffet, and fast food.  Limit or avoid alcohol.  Limit foods that are high in starch and sugar.  Lose weight if you are overweight. Losing just 5-10% of your body weight can help your overall health and prevent diseases such as diabetes and heart disease.  Monitor your salt (sodium) intake, especially if you have high blood pressure. Talk with your health care provider about your sodium intake.  Try to incorporate more vegetarian meals weekly. What foods can I eat? Fruits All fresh, canned (in natural juice), or frozen fruits. Vegetables Fresh or frozen vegetables (raw, steamed, roasted, or grilled). Green salads. Grains Most grains. Choose whole wheat and whole grains most of the time. Rice and pasta, including brown rice and pastas made with whole wheat. Meats and other proteins Lean, well-trimmed beef, veal, pork, and lamb. Chicken and turkey without skin. All fish and shellfish.   Wild duck, rabbit, pheasant, and venison. Egg whites or low-cholesterol egg substitutes. Dried beans, peas, lentils, and tofu. Seeds and most nuts. Dairy Low-fat or nonfat cheeses, including ricotta and mozzarella. Skim or 1% milk (liquid, powdered, or evaporated). Buttermilk made  with low-fat milk. Nonfat or low-fat yogurt. Fats and oils Non-hydrogenated (trans-free) margarines. Vegetable oils, including soybean, sesame, sunflower, olive, peanut, safflower, corn, canola, and cottonseed. Salad dressings or mayonnaise made with a vegetable oil. Beverages Water (mineral or sparkling). Coffee and tea. Diet carbonated beverages. Sweets and desserts Sherbet, gelatin, and fruit ice. Small amounts of dark chocolate. Limit all sweets and desserts. Seasonings and condiments All seasonings and condiments. The items listed above may not be a complete list of foods and beverages you can eat. Contact a dietitian for more options. What foods are not recommended? Fruits Canned fruit in heavy syrup. Fruit in cream or butter sauce. Fried fruit. Limit coconut. Vegetables Vegetables cooked in cheese, cream, or butter sauce. Fried vegetables. Grains Breads made with saturated or trans fats, oils, or whole milk. Croissants. Sweet rolls. Donuts. High-fat crackers, such as cheese crackers. Meats and other proteins Fatty meats, such as hot dogs, ribs, sausage, bacon, rib-eye roast or steak. High-fat deli meats, such as salami and bologna. Caviar. Domestic duck and goose. Organ meats, such as liver. Dairy Cream, sour cream, cream cheese, and creamed cottage cheese. Whole milk cheeses. Whole or 2% milk (liquid, evaporated, or condensed). Whole buttermilk. Cream sauce or high-fat cheese sauce. Whole-milk yogurt. Fats and oils Meat fat, or shortening. Cocoa butter, hydrogenated oils, palm oil, coconut oil, palm kernel oil. Solid fats and shortenings, including bacon fat, salt pork, lard, and butter. Nondairy cream substitutes. Salad dressings with cheese or sour cream. Beverages Regular sodas and any drinks with added sugar. Sweets and desserts Frosting. Pudding. Cookies. Cakes. Pies. Milk chocolate or white chocolate. Buttered syrups. Full-fat ice cream or ice cream drinks. The items listed  above may not be a complete list of foods and beverages to avoid. Contact a dietitian for more information. Summary  Heart-healthy meal planning includes limiting unhealthy fats, increasing healthy fats, and making other diet and lifestyle changes.  Lose weight if you are overweight. Losing just 5-10% of your body weight can help your overall health and prevent diseases such as diabetes and heart disease.  Focus on eating a balance of foods, including fruits and vegetables, low-fat or nonfat dairy, lean protein, nuts and legumes, whole grains, and heart-healthy oils and fats. This information is not intended to replace advice given to you by your health care provider. Make sure you discuss any questions you have with your health care provider. Document Revised: 09/09/2017 Document Reviewed: 09/09/2017 Elsevier Patient Education  2021 [employer] https://www.nhlbi.nih.gov/files/docs/public/heart/dash_brief.pdf">  DASH Eating Plan DASH stands for Dietary Approaches to Stop Hypertension. The DASH eating plan is a healthy eating plan that has been shown to:  Reduce high blood pressure (hypertension).  Reduce your risk for type 2 diabetes, heart disease, and stroke.  Help with weight loss. What are tips for following this plan? Reading food labels  Check food labels for the amount of salt (sodium) per serving. Choose foods with less than 5 percent of the Daily Value of sodium. Generally, foods with less than 300 milligrams (mg) of sodium per serving fit into this eating plan.  To find whole grains, look for the word "whole" as the first word in the ingredient list. Shopping  Buy products labeled as "low-sodium" or "no salt added."  Buy   fresh foods. Avoid canned foods and pre-made or frozen meals. Cooking  Avoid adding salt when cooking. Use salt-free seasonings or herbs instead of table salt or sea salt. Check with your health care provider or pharmacist before using salt  substitutes.  Do not fry foods. Cook foods using healthy methods such as baking, boiling, grilling, roasting, and broiling instead.  Cook with heart-healthy oils, such as olive, canola, avocado, soybean, or sunflower oil. Meal planning  Eat a balanced diet that includes: ? 4 or more servings of fruits and 4 or more servings of vegetables each day. Try to fill one-half of your plate with fruits and vegetables. ? 6-8 servings of whole grains each day. ? Less than 6 oz (170 g) of lean meat, poultry, or fish each day. A 3-oz (85-g) serving of meat is about the same size as a deck of cards. One egg equals 1 oz (28 g). ? 2-3 servings of low-fat dairy each day. One serving is 1 cup (237 mL). ? 1 serving of nuts, seeds, or beans 5 times each week. ? 2-3 servings of heart-healthy fats. Healthy fats called omega-3 fatty acids are found in foods such as walnuts, flaxseeds, fortified milks, and eggs. These fats are also found in cold-water fish, such as sardines, salmon, and mackerel.  Limit how much you eat of: ? Canned or prepackaged foods. ? Food that is high in trans fat, such as some fried foods. ? Food that is high in saturated fat, such as fatty meat. ? Desserts and other sweets, sugary drinks, and other foods with added sugar. ? Full-fat dairy products.  Do not salt foods before eating.  Do not eat more than 4 egg yolks a week.  Try to eat at least 2 vegetarian meals a week.  Eat more home-cooked food and less restaurant, buffet, and fast food.   Lifestyle  When eating at a restaurant, ask that your food be prepared with less salt or no salt, if possible.  If you drink alcohol: ? Limit how much you use to:  0-1 drink a day for women who are not pregnant.  0-2 drinks a day for men. ? Be aware of how much alcohol is in your drink. In the [place] one drink equals one 12 oz bottle of beer (355 mL), one 5 oz glass of wine (148 mL), or one 1 oz glass of hard liquor (44 mL). General  information  Avoid eating more than 2,300 mg of salt a day. If you have hypertension, you may need to reduce your sodium intake to 1,500 mg a day.  Work with your health care provider to maintain a healthy body weight or to lose weight. Ask what an ideal weight is for you.  Get at least 30 minutes of exercise that causes your heart to beat faster (aerobic exercise) most days of the week. Activities may include walking, swimming, or biking.  Work with your health care provider or dietitian to adjust your eating plan to your individual calorie needs. What foods should I eat? Fruits All fresh, dried, or frozen fruit. Canned fruit in natural juice (without added sugar). Vegetables Fresh or frozen vegetables (raw, steamed, roasted, or grilled). Low-sodium or reduced-sodium tomato and vegetable juice. Low-sodium or reduced-sodium tomato sauce and tomato paste. Low-sodium or reduced-sodium canned vegetables. Grains Whole-grain or whole-wheat bread. Whole-grain or whole-wheat pasta. Brown rice. Oatmeal. Quinoa. Bulgur. Whole-grain and low-sodium cereals. Pita bread. Low-fat, low-sodium crackers. Whole-wheat flour tortillas. Meats and other proteins Skinless chicken or   turkey. Ground chicken or turkey. Pork with fat trimmed off. Fish and seafood. Egg whites. Dried beans, peas, or lentils. Unsalted nuts, nut butters, and seeds. Unsalted canned beans. Lean cuts of beef with fat trimmed off. Low-sodium, lean precooked or cured meat, such as sausages or meat loaves. Dairy Low-fat (1%) or fat-free (skim) milk. Reduced-fat, low-fat, or fat-free cheeses. Nonfat, low-sodium ricotta or cottage cheese. Low-fat or nonfat yogurt. Low-fat, low-sodium cheese. Fats and oils Soft margarine without trans fats. Vegetable oil. Reduced-fat, low-fat, or light mayonnaise and salad dressings (reduced-sodium). Canola, safflower, olive, avocado, soybean, and sunflower oils. Avocado. Seasonings and condiments Herbs. Spices.  Seasoning mixes without salt. Other foods Unsalted popcorn and pretzels. Fat-free sweets. The items listed above may not be a complete list of foods and beverages you can eat. Contact a dietitian for more information. What foods should I avoid? Fruits Canned fruit in a light or heavy syrup. Fried fruit. Fruit in cream or butter sauce. Vegetables Creamed or fried vegetables. Vegetables in a cheese sauce. Regular canned vegetables (not low-sodium or reduced-sodium). Regular canned tomato sauce and paste (not low-sodium or reduced-sodium). Regular tomato and vegetable juice (not low-sodium or reduced-sodium). Pickles. Olives. Grains Baked goods made with fat, such as croissants, muffins, or some breads. Dry pasta or rice meal packs. Meats and other proteins Fatty cuts of meat. Ribs. Fried meat. Bacon. Bologna, salami, and other precooked or cured meats, such as sausages or meat loaves. Fat from the back of a pig (fatback). Bratwurst. Salted nuts and seeds. Canned beans with added salt. Canned or smoked fish. Whole eggs or egg yolks. Chicken or turkey with skin. Dairy Whole or 2% milk, cream, and half-and-half. Whole or full-fat cream cheese. Whole-fat or sweetened yogurt. Full-fat cheese. Nondairy creamers. Whipped toppings. Processed cheese and cheese spreads. Fats and oils Butter. Stick margarine. Lard. Shortening. Ghee. Bacon fat. Tropical oils, such as coconut, palm kernel, or palm oil. Seasonings and condiments Onion salt, garlic salt, seasoned salt, table salt, and sea salt. Worcestershire sauce. Tartar sauce. Barbecue sauce. Teriyaki sauce. Soy sauce, including reduced-sodium. Steak sauce. Canned and packaged gravies. Fish sauce. Oyster sauce. Cocktail sauce. Store-bought horseradish. Ketchup. Mustard. Meat flavorings and tenderizers. Bouillon cubes. Hot sauces. Pre-made or packaged marinades. Pre-made or packaged taco seasonings. Relishes. Regular salad dressings. Other foods Salted popcorn  and pretzels. The items listed above may not be a complete list of foods and beverages you should avoid. Contact a dietitian for more information. Where to find more information  National Heart, Lung, and Blood Institute: www.nhlbi.nih.gov  American Heart Association: www.heart.org  Academy of Nutrition and Dietetics: www.eatright.org  National Kidney Foundation: www.kidney.org Summary  The DASH eating plan is a healthy eating plan that has been shown to reduce high blood pressure (hypertension). It may also reduce your risk for type 2 diabetes, heart disease, and stroke.  When on the DASH eating plan, aim to eat more fresh fruits and vegetables, whole grains, lean proteins, low-fat dairy, and heart-healthy fats.  With the DASH eating plan, you should limit salt (sodium) intake to 2,300 mg a day. If you have hypertension, you may need to reduce your sodium intake to 1,500 mg a day.  Work with your health care provider or dietitian to adjust your eating plan to your individual calorie needs. This information is not intended to replace advice given to you by your health care provider. Make sure you discuss any questions you have with your health care provider. Document Revised: 07/06/2019 Document Reviewed: 07/06/2019 Elsevier   Elsevier Patient Education  2021 [employer]   

## 2021-01-13 ENCOUNTER — Other Ambulatory Visit: Payer: Self-pay | Admitting: Nurse Practitioner

## 2021-02-23 NOTE — Progress Notes (Signed)
Primary Physician/Referring:  Minette Brine, FNP  Patient ID: Kathryn Gallegos, female    DOB: [birth year], 65 y.o.   MRN: [id number]  Chief Complaint  Patient presents with   Heart Murmur    2nd opinion   HPI:    Kathryn Gallegos  is a 65 y.o. female with hypertension, bronchial ashtma, hyperlipidemia, and hyperglycemia seen in the emergency room on 05/07/2017 with SVT at the rate of 172 bpm with secondary ST-T wave changes in the inferior and lateral leads suggestive  of ischemia. She converted to sinus rhythm with adenosine was and discharged home. Was evaluated by EP and she preferred medical therapy due to fear of pacemaker implantation. Now on Diltiazem and states she has not had any further episodes.   Patient was last seen in our office 12/18/2020 for follow-up of SVT hypertension, hyperlipidemia.  At that time she was doing well and was advised to follow-up in 1 year.  She now presents at her request as she was recently seen for an urgent care visit and was told she had a "heart murmur".  This concerned patient she was unaware if this was something new or if it has been noted by her office.  Patient is also presently being treated by PCP for pneumonia with antibiotics and steroids. She reports productive cough and wheezing for the last several days. Denies dyspnea, palpitations, dizziness, syncope, near syncope.   Past Medical History:  Diagnosis Date   Anxiety    Anxiety and depression    Asthma    Borderline diabetes    Carpal tunnel syndrome 07/05/2007   Qualifier: Diagnosis of  By: [name], Stanley     Dyspnea on exertion 09/22/2018   HIGH BLOOD PRESSURE 07/04/2007   Qualifier: Diagnosis of  By: [name], Stanley     HTN (hypertension)    Hyperglycemia    Hyperlipidemia    IMPINGEMENT SYNDROME 03/09/2010   Qualifier: Diagnosis of  By: [name], Stanley     Obesity    PSVT (paroxysmal supraventricular tachycardia) ([place]) 09/22/2018   Pure hypercholesterolemia  09/22/2018   Rapid palpitations    Seasonal allergies    Past Surgical History:  Procedure Laterality Date   BREAST BIOPSY Left    SHOULDER ARTHROSCOPY WITH BICEPSTENOTOMY Right 01/09/2019   Procedure: SHOULDER ARTHROSCOPY WITH biceps tenotomy, limited debriedment;  Surgeon: [name], MD;  Location: [facility];  Service: Orthopedics;  Laterality: Right;   TOTAL ABDOMINAL HYSTERECTOMY     TUBAL LIGATION     Family History  Problem Relation Age of Onset   Pancreatic cancer Mother    Esophageal cancer Maternal Grandmother    Heart disease Maternal Grandfather    Heart disease Maternal Uncle    Colon cancer Maternal Aunt    Stomach cancer Maternal Aunt    Arthritis Other    Cancer Other    Diabetes Other    Breast cancer Neg Hx     Social History   Tobacco Use   Smoking status: Former    Packs/day: 0.25    Years: 2.00    Pack years: 0.50    Types: Cigarettes    Quit date: 1970    Years since quitting: 52.5   Smokeless tobacco: Never  Substance Use Topics   Alcohol use: No   ROS  Review of Systems  Cardiovascular:  Positive for dyspnea on exertion (mild, chornic and stable). Negative for chest pain, claudication, leg swelling, near-syncope, orthopnea, palpitations, paroxysmal nocturnal dyspnea and syncope.  Respiratory:  Positive for cough, sputum production and wheezing. Negative for shortness of breath.   Hematologic/Lymphatic: Does not bruise/bleed easily.  Musculoskeletal:  Positive for back pain and joint pain (shoulder).  Gastrointestinal:  Negative for melena.  Neurological:  Negative for weakness.  Objective  Blood pressure (!) 151/95, pulse 71, temperature 98 F (36.7 C), resp. rate 16, height 5' 2"  (1.575 m), weight 174 lb (78.9 kg), SpO2 96 %.  Vitals with BMI 02/24/2021 02/24/2021 12/18/2020  Height - 5' 2"  5' 2"   Weight - 174 lbs 171 lbs 3 oz  BMI - 81.01 75.10  Systolic 258 527 782  Diastolic 95 423 72  Pulse 71 78 74     Physical Exam Vitals  reviewed.  Constitutional:      Appearance: She is obese.  Cardiovascular:     Rate and Rhythm: Normal rate and regular rhythm.     Pulses: Intact distal pulses.     Heart sounds: S1 normal and S2 normal. Murmur heard.  Midsystolic murmur is present with a grade of 2/6 at the upper right sternal border.    No gallop.     Comments: No leg edema, no JVD. Pulmonary:     Effort: Pulmonary effort is normal. No respiratory distress.     Breath sounds: Wheezing (bilateral throughout) present. No rales. Rhonchi: bilateral bases. Abdominal:     General: Bowel sounds are normal.  Musculoskeletal:     Right lower leg: No edema.     Left lower leg: No edema.  Neurological:     Mental Status: She is alert.   Laboratory examination:   Recent Labs    07/23/20 1727  NA 141  K 4.1  CL 102  CO2 24  GLUCOSE 94  BUN 12  CREATININE 0.85  CALCIUM 9.9  GFRNONAA 73  GFRAA 84   CrCl cannot be calculated (Patient's most recent lab result is older than the maximum 21 days allowed.).  CMP Latest Ref Rng & Units 07/23/2020 10/17/2019 01/05/2019  Glucose 65 - 99 mg/dL 94 106(H) 110(H)  BUN 8 - 27 mg/dL 12 15 12   Creatinine 0.57 - 1.00 mg/dL 0.85 0.85 0.69  Sodium 134 - 144 mmol/L 141 145(H) 138  Potassium 3.5 - 5.2 mmol/L 4.1 4.1 3.3(L)  Chloride 96 - 106 mmol/L 102 105 102  CO2 20 - 29 mmol/L 24 23 27   Calcium 8.7 - 10.3 mg/dL 9.9 9.5 9.0  Total Protein 6.0 - 8.5 g/dL - 7.3 -  Total Bilirubin 0.0 - 1.2 mg/dL - 0.3 -  Alkaline Phos 39 - 117 IU/L - 144(H) -  AST 0 - 40 IU/L - 17 -  ALT 0 - 32 IU/L - 23 -   CBC Latest Ref Rng & Units 04/16/2020 10/17/2019 01/05/2019  WBC 3.4 - 10.8 x10E3/uL 7.2 6.7 6.6  Hemoglobin 11.1 - 15.9 g/dL 15.3 14.9 14.7  Hematocrit 34.0 - 46.6 % 44.0 45.1 44.7  Platelets 150 - 450 x10E3/uL 227 212 216   Lipid Panel     Component Value Date/Time   CHOL 173 10/17/2019 1130   TRIG 75 10/17/2019 1130   HDL 67 10/17/2019 1130   CHOLHDL 2.6 10/17/2019 1130   LDLCALC 92  10/17/2019 1130   HEMOGLOBIN A1C Lab Results  Component Value Date   HGBA1C 5.9 (H) 07/23/2020   TSH Recent Labs    04/16/20 1515  TSH 0.651    External labs:   09/29/2017:  Hemoglobin A1c 5.7%.  TSH normal.  Vitamin D  normal.  CBC normal.  Creatinine 0.7, EGFR 84/97, potassium 4.3, CMP normal.  Cholesterol 192, triglycerides 92, HDL 58, LDL 116.  Allergies   Allergies  Allergen Reactions   Amoxicillin Other (See Comments)    Inflammation in pancreas.  Did it involve swelling of the face/tongue/throat, SOB, or low BP? No Did it involve sudden or severe rash/hives, skin peeling, or any reaction on the inside of your mouth or nose? No Did you need to seek medical attention at a hospital or doctor's office? Yes When did it last happen?      5-6 years ago If all above answers are "NO", may proceed with cephalosporin use.     Medications Prior to Visit:   Outpatient Medications Prior to Visit  Medication Sig Dispense Refill   acetaminophen (TYLENOL) 650 MG CR tablet Take 1,300 mg by mouth every 8 (eight) hours as needed for pain.     Apple Cider Vinegar 600 MG CAPS Take 1 capsule by mouth daily.     benzonatate (TESSALON) 200 MG capsule Take 200 mg by mouth 3 (three) times daily as needed.     Blood Glucose Monitoring Suppl (BLOOD GLUCOSE MONITOR SYSTEM) w/Device KIT 1 each by Does not apply route daily. 1 kit 0   CALCIUM CITRATE PO Take 1 capsule by mouth daily.     celecoxib (CELEBREX) 200 MG capsule Take 1 capsule (200 mg total) by mouth 2 (two) times daily. 60 capsule 2   Cholecalciferol (VITAMIN D3) 125 MCG (5000 UT) CAPS Take 5,000 Units by mouth daily.     cyclobenzaprine (FLEXERIL) 5 MG tablet Take 1 tablet (5 mg total) by mouth 2 (two) times daily. 60 tablet 1   diltiazem (CARDIZEM CD) 240 MG 24 hr capsule TAKE 1 CAPSULE BY MOUTH EVERY DAY 90 capsule 3   esomeprazole (NEXIUM) 20 MG capsule Take 20 mg by mouth daily as needed (heartburn).      fluticasone (FLONASE) 50  MCG/ACT nasal spray Place 1 spray into both nostrils daily as needed for allergies or rhinitis.     levocetirizine (XYZAL) 5 MG tablet Take 5 mg by mouth daily.      NON FORMULARY Take 1 capsule by mouth daily. [employer] Superfood     NON FORMULARY Take 1 capsule by mouth See admin instructions. 7 Dry Cleanse     Probiotic Product (RA PROBIOTIC COMPLEX) CAPS Take 1 capsule by mouth daily.     rosuvastatin (CRESTOR) 10 MG tablet Take 1 tablet (10 mg total) by mouth daily. 90 tablet 3   sertraline (ZOLOFT) 50 MG tablet TAKE 1 TABLET BY MOUTH EVERY DAY 90 tablet 1   Tetrahydrozoline HCl (VISINE OP) Place 1 drop into both eyes daily as needed (allergies).      ZINC-VITAMIN C PO Take 1 tablet by mouth daily.     NON FORMULARY      No facility-administered medications prior to visit.   Final Medications at End of Visit    Current Meds  Medication Sig   acetaminophen (TYLENOL) 650 MG CR tablet Take 1,300 mg by mouth every 8 (eight) hours as needed for pain.   Apple Cider Vinegar 600 MG CAPS Take 1 capsule by mouth daily.   benzonatate (TESSALON) 200 MG capsule Take 200 mg by mouth 3 (three) times daily as needed.   Blood Glucose Monitoring Suppl (BLOOD GLUCOSE MONITOR SYSTEM) w/Device KIT 1 each by Does not apply route daily.   CALCIUM CITRATE PO Take 1 capsule by mouth daily.  celecoxib (CELEBREX) 200 MG capsule Take 1 capsule (200 mg total) by mouth 2 (two) times daily.   Cholecalciferol (VITAMIN D3) 125 MCG (5000 UT) CAPS Take 5,000 Units by mouth daily.   cyclobenzaprine (FLEXERIL) 5 MG tablet Take 1 tablet (5 mg total) by mouth 2 (two) times daily.   diltiazem (CARDIZEM CD) 240 MG 24 hr capsule TAKE 1 CAPSULE BY MOUTH EVERY DAY   esomeprazole (NEXIUM) 20 MG capsule Take 20 mg by mouth daily as needed (heartburn).    fluticasone (FLONASE) 50 MCG/ACT nasal spray Place 1 spray into both nostrils daily as needed for allergies or rhinitis.   levocetirizine (XYZAL) 5 MG tablet Take 5 mg by mouth  daily.    NON FORMULARY Take 1 capsule by mouth daily. [employer] Superfood   NON FORMULARY Take 1 capsule by mouth See admin instructions. 7 Dry Cleanse   Probiotic Product (RA PROBIOTIC COMPLEX) CAPS Take 1 capsule by mouth daily.   rosuvastatin (CRESTOR) 10 MG tablet Take 1 tablet (10 mg total) by mouth daily.   sertraline (ZOLOFT) 50 MG tablet TAKE 1 TABLET BY MOUTH EVERY DAY   Tetrahydrozoline HCl (VISINE OP) Place 1 drop into both eyes daily as needed (allergies).    ZINC-VITAMIN C PO Take 1 tablet by mouth daily.   Radiology:   No results found.  Cardiac Studies:   Exercise sestamibi stress test 07/21/2018:  1. The patient performed treadmill exercise using Bruce protocol, completing 6:45 minutes. The patient completed an estimated workload of 8.2 METS, reaching 87% of the maximum predicted heart rate. Resting hypertension 150/100 mmHg with exaggerated exercise BP response, peak BP 230/110 mmHg. No stress symptoms reported. Exercise capacity was normal. No ischemic changes seen on stress electrocardiogram.   2. The overall quality of the study is excellent. There is no evidence of abnormal lung activity. Stress and rest SPECT images demonstrate homogeneous tracer distribution throughout the myocardium. Gated SPECT imaging reveals normal myocardial thickening and wall motion. The left ventricular ejection fraction was normal (73%).  3. Low risk study.  Echocardiogram 07/05/2018: Left ventricle cavity is normal in size. Mild concentric hypertrophy of the left ventricle. Normal global wall motion. Doppler evidence of grade I (impaired) diastolic dysfunction, normal LAP. Calculated EF 67%. Mild (Grade I) mitral regurgitation. Mild tricuspid regurgitation.  No evidence of pulmonary hypertension.  EKG   EKG 12/18/2020: Normal sinus rhythm at a rate of 70 bpm.  Normal axis.  Incomplete right bundle branch block.  Poor R wave progression, cannot exclude anteroseptal infarct old.  Compared to EKG  10/11/2019, PRWP new.  EKG 10/10/2018: Normal sinus rhythm at rate of 71 bpm, normal axis, no evidence of ischemia, normal EKG.   No significant change from  EKG 05/14/2017: Normal sinus rhythm at the rate of 71 bpm, normal intervals.  No evidence of ischemia, normal EKG.   Assessment     ICD-10-CM   1. Essential hypertension  I10 PCV ECHOCARDIOGRAM COMPLETE    2. Nonrheumatic mitral valve regurgitation  I34.0 PCV ECHOCARDIOGRAM COMPLETE      No orders of the defined types were placed in this encounter.   Medications Discontinued During This Encounter  Medication Reason   NON FORMULARY Error     Recommendations:   DOLOROS KWOLEK  is a 65 y.o.  female  with  hypertension, bronchial ashtma, hyperlipidemia, and hyperglycemia seen in the emergency room on 05/07/2017 with SVT at the rate of 172 bpm with secondary ST-T wave changes in the inferior and lateral  leads suggestive  of ischemia. She converted to sinus rhythm with adenosine was and discharged home. Was evaluated by EP and she preferred medical therapy due to fear of pacemaker implantation. Now on Diltiazem and states she has not had any further episodes.  Patient was last seen in our office 12/18/2020 for follow-up of SVT hypertension, hyperlipidemia.  At that time she was doing well and was advised to follow-up in 1 year.  She now presents at her request as she was told at urgent care yesterday that she "had a heart murmur".  Reviewed and discussed with patient that she does indeed have midsystolic murmur on exam at the right upper sternal border and that this has been stable and that we have been following for her.  She has had echocardiogram in 2019, will repeat echo at this time to follow-up on mitral regurgitation.  In regard to pneumonia, counseled patient to follow-up with her PCP if symptoms fail to improve when she is completed her course of antibiotics and steroids, she verbalized understanding agreement.  Patient blood  pressure is notably elevated in our office today, however it is typically well controlled.  Advised patient to obtain blood pressure cuff and monitor at home on a regular basis.  She will notify our office if blood pressure remains elevated.  Patient is otherwise stable from a cardiovascular standpoint.  Follow-up in 1 year, sooner if needed, for hypertension, SVT, mitral regurgitation.   Alethia Berthold, PA-C 02/24/2021, 10:19 AM Office: [phone]

## 2021-02-24 ENCOUNTER — Other Ambulatory Visit: Payer: Self-pay

## 2021-02-24 ENCOUNTER — Encounter: Payer: Self-pay | Admitting: Student

## 2021-02-24 ENCOUNTER — Ambulatory Visit: Payer: BC Managed Care – PPO | Admitting: Student

## 2021-02-24 VITALS — BP 151/95 | HR 71 | Temp 98.0°F | Resp 16 | Ht 62.0 in | Wt 174.0 lb

## 2021-02-24 DIAGNOSIS — I340 Nonrheumatic mitral (valve) insufficiency: Secondary | ICD-10-CM

## 2021-02-24 DIAGNOSIS — I10 Essential (primary) hypertension: Secondary | ICD-10-CM

## 2021-03-05 ENCOUNTER — Encounter: Payer: Self-pay | Admitting: Student

## 2021-03-12 ENCOUNTER — Ambulatory Visit: Payer: BC Managed Care – PPO | Admitting: Orthopedic Surgery

## 2021-03-23 ENCOUNTER — Ambulatory Visit: Payer: BC Managed Care – PPO | Admitting: Orthopedic Surgery

## 2021-03-23 ENCOUNTER — Encounter: Payer: Self-pay | Admitting: Orthopedic Surgery

## 2021-03-23 ENCOUNTER — Other Ambulatory Visit: Payer: Self-pay

## 2021-03-23 DIAGNOSIS — M542 Cervicalgia: Secondary | ICD-10-CM

## 2021-03-23 DIAGNOSIS — Z9889 Other specified postprocedural states: Secondary | ICD-10-CM

## 2021-03-23 DIAGNOSIS — G8929 Other chronic pain: Secondary | ICD-10-CM

## 2021-03-23 NOTE — Progress Notes (Signed)
Chief Complaint  Patient presents with   Neck Pain    Neck and right arm better than before but stiff again    65 year old female chronic neck and shoulder pain status post arthroscopy right shoulder extensive debridement biceps tenotomy, history of cervical spondylosis without myelopathy  Status post multiple cervical spine injections  Multiple biceps tendon injections  Multiple shoulder injections  Patient complains of pain and stiffness brought on by abnormal position of the arm  Focused shoulder and neck exam  Neck tenderness swelling and decreased range of motion all directions  Shoulder forward elevation 150 degrees with pain  I do not see anything major here.  Perhaps exacerbation of underlying issues  Recommend Celebrex and Flexeril  Follow-up in a month

## 2021-03-23 NOTE — Patient Instructions (Signed)
Heat   Celebrex and Flexeril

## 2021-03-24 ENCOUNTER — Ambulatory Visit: Payer: BC Managed Care – PPO

## 2021-03-24 DIAGNOSIS — I340 Nonrheumatic mitral (valve) insufficiency: Secondary | ICD-10-CM

## 2021-03-24 DIAGNOSIS — I10 Essential (primary) hypertension: Secondary | ICD-10-CM

## 2021-04-07 ENCOUNTER — Other Ambulatory Visit: Payer: Self-pay | Admitting: Orthopedic Surgery

## 2021-04-07 DIAGNOSIS — Z9889 Other specified postprocedural states: Secondary | ICD-10-CM

## 2021-04-07 NOTE — Telephone Encounter (Signed)
Patient requests refill for Flexeril 5 mgs.  Qty  60       Sig: Take 1 tablet (5 mg total) by mouth 2 (two) times daily.    Patient uses [facility] on [address] [place]

## 2021-04-08 MED ORDER — CYCLOBENZAPRINE HCL 5 MG PO TABS: 5.0000 mg | ORAL_TABLET | Freq: Two times a day (BID) | ORAL | 1 refills | Status: DC

## 2021-04-23 ENCOUNTER — Ambulatory Visit: Payer: BC Managed Care – PPO | Admitting: Orthopedic Surgery

## 2021-04-30 ENCOUNTER — Other Ambulatory Visit: Payer: Self-pay

## 2021-04-30 ENCOUNTER — Ambulatory Visit (INDEPENDENT_AMBULATORY_CARE_PROVIDER_SITE_OTHER): Payer: Self-pay | Admitting: Orthopedic Surgery

## 2021-04-30 VITALS — BP 136/85 | HR 73 | Ht 64.0 in | Wt 175.0 lb

## 2021-04-30 DIAGNOSIS — M542 Cervicalgia: Secondary | ICD-10-CM

## 2021-04-30 DIAGNOSIS — M25511 Pain in right shoulder: Secondary | ICD-10-CM

## 2021-04-30 DIAGNOSIS — M47812 Spondylosis without myelopathy or radiculopathy, cervical region: Secondary | ICD-10-CM

## 2021-04-30 DIAGNOSIS — G8929 Other chronic pain: Secondary | ICD-10-CM

## 2021-04-30 NOTE — Progress Notes (Signed)
Chief Complaint  Patient presents with   Neck Pain    Released by neurosurgeon    Shoulder Pain    Right shoulder pain     Kathryn Gallegos has had multiple injections by neurosurgery which relieved her neck pain she still having pain from the top of her shoulder to her elbow  She has pain with abduction  She agreed to injection subacromial space  Right shoulder injection  Verbal consent was given site was confirmed and then Celestone 6 mg and 3 cc 1% lidocaine was injected to the subacromial space  Patient will continue out of work status as she cannot type because of the arm positioning which causes her pain and I will see her in 2 months  (Coding: Chronic shoulder pain with exacerbation, injection)

## 2021-05-17 ENCOUNTER — Other Ambulatory Visit: Payer: Self-pay | Admitting: Nurse Practitioner

## 2021-07-02 ENCOUNTER — Ambulatory Visit (INDEPENDENT_AMBULATORY_CARE_PROVIDER_SITE_OTHER): Payer: Self-pay | Admitting: Orthopedic Surgery

## 2021-07-02 ENCOUNTER — Other Ambulatory Visit: Payer: Self-pay

## 2021-07-02 DIAGNOSIS — M25511 Pain in right shoulder: Secondary | ICD-10-CM

## 2021-07-02 DIAGNOSIS — Z9889 Other specified postprocedural states: Secondary | ICD-10-CM

## 2021-07-02 DIAGNOSIS — G8929 Other chronic pain: Secondary | ICD-10-CM

## 2021-07-02 DIAGNOSIS — M47812 Spondylosis without myelopathy or radiculopathy, cervical region: Secondary | ICD-10-CM

## 2021-07-02 NOTE — Progress Notes (Signed)
Chief Complaint  Patient presents with   Shoulder Pain    S/p arthroscopy of right shoulder DOS 01/09/19 Pain is worsening, even worse since weather turned colder   Encounter Diagnoses  Name Primary?   Neck pain, chronic Yes   Cervical spondylosis without myelopathy    Chronic right shoulder pain    S/P arthroscopy of right shoulder 01/09/19     Follow-up visit  Kathryn Gallegos continues to complain of right shoulder pain she says it has increased to include some neck pain and some medial elbow pain requiring her to hang her arm down away from her body to get relief  Examination reveals tenderness in the posterior subacromial space and lateral deltoid with decreased range of motion and painful range of motion consistent with impingement syndrome bursitis  Recommend subacromial injection  She gave consent for injection right subacromial space.  Site was confirmed.  40 mg of Depo-Medrol and 3 cc 1% lidocaine injected subacromial space without complication I will see the patient back in 6 weeks

## 2021-07-02 NOTE — Patient Instructions (Signed)
Wear braces at night   You have received an injection of steroids into the joint. 15% of patients will have increased pain within the 24 hours postinjection.   This is transient and will go away.   We recommend that you use ice packs on the injection site for 20 minutes every 2 hours and extra strength Tylenol 2 tablets every 8 as needed until the pain resolves.  If you continue to have pain after taking the Tylenol and using the ice please call the office for further instructions.

## 2021-08-13 ENCOUNTER — Other Ambulatory Visit: Payer: Self-pay

## 2021-08-13 ENCOUNTER — Ambulatory Visit (INDEPENDENT_AMBULATORY_CARE_PROVIDER_SITE_OTHER): Payer: Self-pay | Admitting: Orthopedic Surgery

## 2021-08-13 DIAGNOSIS — M25511 Pain in right shoulder: Secondary | ICD-10-CM

## 2021-08-13 DIAGNOSIS — Z9889 Other specified postprocedural states: Secondary | ICD-10-CM

## 2021-08-13 DIAGNOSIS — G8929 Other chronic pain: Secondary | ICD-10-CM

## 2021-08-13 MED ORDER — CYCLOBENZAPRINE HCL 5 MG PO TABS: 5.0000 mg | ORAL_TABLET | Freq: Two times a day (BID) | ORAL | 1 refills | Status: DC

## 2021-08-13 NOTE — Progress Notes (Signed)
Chief Complaint  Patient presents with   Shoulder Pain    RT, pain has worsened    65 year old female status post right shoulder surgery complicated by foraminal stenosis on the right side of the cervical spine for which she gets injections  She got an injection in the right shoulder last visit  She says her shoulder and arm are worse she complains of pain in the right arm shoulder biceps sometimes radiating up into the cervical spine  Her examination reveals tenderness around the right shoulder joint with weakness on straight abduction  Some weakness in the empty can position  Painful range of motion especially flexing past 100 degrees  Repeat MRI RT shoulder   At this point it is impossible to tell the symptoms are coming from the cervical foraminal stenosis or from the rotator cuff  The MRI should help delineate this better   Meds ordered this encounter  Medications   cyclobenzaprine (FLEXERIL) 5 MG tablet    Sig: Take 1 tablet (5 mg total) by mouth 2 (two) times daily.    Dispense:  60 tablet    Refill:  1

## 2021-08-18 ENCOUNTER — Telehealth: Payer: Self-pay | Admitting: Radiology

## 2021-08-18 NOTE — Telephone Encounter (Signed)
Patient does not have insurance card now, when she gets it she will call me and get [place] a copy of the card.

## 2021-11-23 ENCOUNTER — Telehealth: Payer: Self-pay | Admitting: Student

## 2021-11-23 ENCOUNTER — Other Ambulatory Visit: Payer: Self-pay | Admitting: Cardiology

## 2021-11-23 DIAGNOSIS — I471 Supraventricular tachycardia: Secondary | ICD-10-CM

## 2021-11-23 DIAGNOSIS — I10 Essential (primary) hypertension: Secondary | ICD-10-CM

## 2021-11-23 NOTE — Telephone Encounter (Signed)
Med has been refilled and patient is aware

## 2021-11-23 NOTE — Telephone Encounter (Signed)
Patient says she needs refill for diltiazem 240 MG. She is about to take her last pill and pharmacy told her she is out of refills. It may be partly due to the fact that she uses [facility] for the majority of her medications, and she is asking this one to be filled at [facility] [address]. Please call her when this has been taken care of. ?

## 2021-12-21 ENCOUNTER — Ambulatory Visit: Payer: BC Managed Care – PPO | Admitting: Student

## 2021-12-31 ENCOUNTER — Telehealth: Payer: Self-pay | Admitting: Orthopedic Surgery

## 2021-12-31 NOTE — Telephone Encounter (Signed)
Call received via voice message received from [facility], following up on status of a records requests which she said was sent to our office"in March" for "specific records from Dr Aline Brochure" - per representative at ph# [phone], ext [phone]/fax# [phone]

## 2022-01-01 NOTE — Telephone Encounter (Signed)
Patient called back - states Abigail Butts called her and advised that we will need to schedule another appointment with Dr Aline Brochure. Done.

## 2022-01-18 ENCOUNTER — Ambulatory Visit (INDEPENDENT_AMBULATORY_CARE_PROVIDER_SITE_OTHER): Payer: Self-pay | Admitting: Orthopedic Surgery

## 2022-01-18 ENCOUNTER — Encounter: Payer: Self-pay | Admitting: Orthopedic Surgery

## 2022-01-18 ENCOUNTER — Telehealth: Payer: Self-pay | Admitting: Radiology

## 2022-01-18 ENCOUNTER — Other Ambulatory Visit: Payer: Self-pay | Admitting: Radiology

## 2022-01-18 DIAGNOSIS — M25511 Pain in right shoulder: Secondary | ICD-10-CM

## 2022-01-18 DIAGNOSIS — M7711 Lateral epicondylitis, right elbow: Secondary | ICD-10-CM

## 2022-01-18 DIAGNOSIS — M5136 Other intervertebral disc degeneration, lumbar region: Secondary | ICD-10-CM | POA: Insufficient documentation

## 2022-01-18 DIAGNOSIS — M461 Sacroiliitis, not elsewhere classified: Secondary | ICD-10-CM | POA: Insufficient documentation

## 2022-01-18 DIAGNOSIS — M47812 Spondylosis without myelopathy or radiculopathy, cervical region: Secondary | ICD-10-CM

## 2022-01-18 DIAGNOSIS — Z9889 Other specified postprocedural states: Secondary | ICD-10-CM

## 2022-01-18 DIAGNOSIS — M542 Cervicalgia: Secondary | ICD-10-CM

## 2022-01-18 DIAGNOSIS — G8929 Other chronic pain: Secondary | ICD-10-CM

## 2022-01-18 DIAGNOSIS — Z800 Family history of malignant neoplasm of digestive organs: Secondary | ICD-10-CM | POA: Insufficient documentation

## 2022-01-18 MED ORDER — HYDROCODONE-ACETAMINOPHEN 5-325 MG PO TABS: 1.0000 | ORAL_TABLET | Freq: Three times a day (TID) | ORAL | 0 refills | Status: AC | PRN

## 2022-01-18 MED ORDER — HYDROCODONE-ACETAMINOPHEN 5-325 MG PO TABS: 1.0000 | ORAL_TABLET | Freq: Three times a day (TID) | ORAL | 0 refills | Status: DC | PRN

## 2022-01-18 NOTE — Progress Notes (Signed)
Chief Complaint  Patient presents with   Shoulder Pain    Right   ' Meds ordered this encounter  Medications   DISCONTD: HYDROcodone-acetaminophen (NORCO/VICODIN) 5-325 MG tablet    Sig: Take 1 tablet by mouth every 8 (eight) hours as needed for up to 5 days for moderate pain.    Dispense:  15 tablet    Refill:  0   HYDROcodone-acetaminophen (NORCO/VICODIN) 5-325 MG tablet    Sig: Take 1 tablet by mouth every 8 (eight) hours as needed for up to 5 days for moderate pain.    Dispense:  15 tablet    Refill:  0

## 2022-01-18 NOTE — Patient Instructions (Signed)
Call when you can do the MRI of the shoulder

## 2022-01-18 NOTE — Telephone Encounter (Signed)
-----   Message from Uvaldo Bristle sent at 01/18/2022  4:07 PM EDT ----- Regarding: Any Rx REBEKAH, ZACKERY [[id number]] asked at check out about Rx for pain - [facility] [address] - said may have told you a different [facility]. It is [address]

## 2022-01-18 NOTE — Progress Notes (Unsigned)
Chief Complaint  Patient presents with   Shoulder Pain    Right    Kathryn Gallegos comes in today for recheck.  She continues to take Celebrex and gabapentin  She says she is in a lot of pain with burning down the back of her right upper arm and she cannot lay on her right shoulder she also has some new onset right lateral elbow pain  She could not get the MRI of the shoulder that we ordered  This leaves [place] in a quandary.  We can reevaluate the cuff tear in the shoulder without the scan  Passive range of motion right shoulder 0-90 abduction 0-90 flexion 0 to 45 degrees external rotation with the arm at the side tenderness in the right upper trapezius muscle and neck  Painful range of motion cervical spine with increased radicular symptoms when rotating the spine   RIGHT ELBOW PAIN AND TENDERNESS AND WRIST EXT PAIN WITH RESISTANCE   Encounter Diagnoses  Name Primary?   Right tennis elbow Yes   Chronic right shoulder pain    S/P arthroscopy of right shoulder 01/09/19    Neck pain, chronic    Cervical spondylosis without myelopathy     Injection for right tennis elbow  MRI for right shoulder pain  Continue to see pain management specialist for her neck symptoms for injections as needed   Procedure note injection for right tennis elbow   Diagnosis right tennis elbow  Anesthesia ethyl chloride was used Alcohol use is clean the skin  After we obtained verbal consent and timeout a 25-gauge needle was used to inject 40 mg of Depo-Medrol and 3 cc of 1% lidocaine just distal to the insertion of the ECRB  There were no complications and a sterile bandage was applied.   FU AFTER MRI SHOULDER   Meds ordered this encounter  Medications   HYDROcodone-acetaminophen (NORCO/VICODIN) 5-325 MG tablet    Sig: Take 1 tablet by mouth every 8 (eight) hours as needed for up to 5 days for moderate pain.    Dispense:  15 tablet    Refill:  0

## 2022-01-18 NOTE — Telephone Encounter (Signed)
Wants oxycodone or hydrocodone to [facility]

## 2022-01-18 NOTE — Telephone Encounter (Signed)
Yes she said [facility], I have sent message to Dr Aline Brochure to resend to different pharmacy per her request

## 2022-02-19 ENCOUNTER — Encounter: Payer: Self-pay | Admitting: Student

## 2022-02-19 ENCOUNTER — Ambulatory Visit: Payer: Medicare Other | Admitting: Student

## 2022-02-19 ENCOUNTER — Other Ambulatory Visit: Payer: Self-pay | Admitting: Obstetrics and Gynecology

## 2022-02-19 VITALS — BP 144/84 | HR 88 | Temp 98.0°F | Resp 17 | Ht 64.0 in | Wt 178.0 lb

## 2022-02-19 DIAGNOSIS — I471 Supraventricular tachycardia: Secondary | ICD-10-CM

## 2022-02-19 DIAGNOSIS — E7800 Pure hypercholesterolemia, unspecified: Secondary | ICD-10-CM

## 2022-02-19 DIAGNOSIS — Z1231 Encounter for screening mammogram for malignant neoplasm of breast: Secondary | ICD-10-CM

## 2022-02-19 DIAGNOSIS — I340 Nonrheumatic mitral (valve) insufficiency: Secondary | ICD-10-CM

## 2022-02-19 MED ORDER — SERTRALINE HCL 50 MG PO TABS
50.0000 mg | ORAL_TABLET | Freq: Every day | ORAL | 0 refills | Status: DC
Start: 2022-02-19 — End: 2022-06-02

## 2022-02-19 MED ORDER — ROSUVASTATIN CALCIUM 10 MG PO TABS
10.0000 mg | ORAL_TABLET | Freq: Every day | ORAL | 3 refills | Status: DC
Start: 2022-02-19 — End: 2023-04-14

## 2022-02-19 NOTE — Progress Notes (Signed)
Primary Physician/Referring:  Minette Brine, FNP  Patient ID: [name], female    DOB: 1956-07-15, 66 y.o.   MRN: [id number]  Chief Complaint  Patient presents with   Follow-up    1 YEAR   Hypertension   HPI:    ELSBETH YEARICK  is a 66 y.o. female with hypertension, bronchial ashtma, hyperlipidemia, and hyperglycemia seen in the emergency room on 05/07/2017 with SVT at the rate of 172 bpm with secondary ST-T wave changes in the inferior and lateral leads suggestive  of ischemia. She converted to sinus rhythm with adenosine was and discharged home. Was evaluated by EP and she preferred medical therapy due to fear of pacemaker implantation. Now on Diltiazem and states she has not had any further episodes.   Patient was last seen in the office 02/24/2021 at which time ordered repeat echocardiogram.  Echocardiogram revealed preserved LVEF with mild mitral regurgitation, unchanged compared to 2019.  Patient now presents for annual follow-up.  Patient has had no known recurrence of SVT.  Denies dyspnea, palpitations, dizziness, syncope, near syncope.  Overall from a cardiovascular standpoint she is feeling well.  She does admit to high sodium intake over the last day, which is likely why blood pressure is elevated at today's visit, it is typically well controlled.   Past Medical History:  Diagnosis Date   Anxiety    Anxiety and depression    Asthma    Borderline diabetes    Carpal tunnel syndrome 07/05/2007   Qualifier: Diagnosis of  By: [name], Stanley     Dyspnea on exertion 09/22/2018   HIGH BLOOD PRESSURE 07/04/2007   Qualifier: Diagnosis of  By: [name], Stanley     HTN (hypertension)    Hyperglycemia    Hyperlipidemia    IMPINGEMENT SYNDROME 03/09/2010   Qualifier: Diagnosis of  By: [name], Stanley     Obesity    PSVT (paroxysmal supraventricular tachycardia) ([facility]) 09/22/2018   Pure hypercholesterolemia 09/22/2018   Rapid palpitations    Seasonal allergies     Past Surgical History:  Procedure Laterality Date   BREAST BIOPSY Left    SHOULDER ARTHROSCOPY WITH BICEPSTENOTOMY Right 01/09/2019   Procedure: SHOULDER ARTHROSCOPY WITH biceps tenotomy, limited debriedment;  Surgeon: [name], MD;  Location: [facility];  Service: Orthopedics;  Laterality: Right;   TOTAL ABDOMINAL HYSTERECTOMY     TUBAL LIGATION     Family History  Problem Relation Age of Onset   Pancreatic cancer Mother    Esophageal cancer Maternal Grandmother    Heart disease Maternal Grandfather    Heart disease Maternal Uncle    Colon cancer Maternal Aunt    Stomach cancer Maternal Aunt    Arthritis Other    Cancer Other    Diabetes Other    Breast cancer Neg Hx     Social History   Tobacco Use   Smoking status: Former    Packs/day: 0.25    Years: 2.00    Total pack years: 0.50    Types: Cigarettes    Quit date: 1970    Years since quitting: 53.5   Smokeless tobacco: Never  Substance Use Topics   Alcohol use: No   ROS  Review of Systems  Cardiovascular:  Positive for dyspnea on exertion (mild, stable). Negative for chest pain, claudication, leg swelling, near-syncope, orthopnea, palpitations, paroxysmal nocturnal dyspnea and syncope.  Musculoskeletal:  Positive for back pain and joint pain (shoulder).  Neurological:  Negative for weakness.  Objective  Blood pressure (!) 144/84, pulse 88, temperature 98 F (36.7 C), temperature source Temporal, resp. rate 17, height 5' 4"  (1.626 m), weight 178 lb (80.7 kg), SpO2 98 %.     02/19/2022    1:36 PM 04/30/2021    4:38 PM 03/23/2021    4:34 PM  Vitals with BMI  Height 5' 4"  5' 4"  5' 2"   Weight 178 lbs 175 lbs 176 lbs  BMI 30.54 81.01 75.10  Systolic 258 527 782  Diastolic 84 85 92  Pulse 88 73 82     Physical Exam Vitals reviewed.  Constitutional:      Appearance: She is obese.  Cardiovascular:     Rate and Rhythm: Normal rate and regular rhythm.     Pulses: Intact distal pulses.     Heart sounds:  S1 normal and S2 normal. Murmur heard.     Midsystolic murmur is present with a grade of 2/6 at the upper right sternal border.     No gallop.     Comments: No JVD Pulmonary:     Effort: Pulmonary effort is normal. No respiratory distress.     Breath sounds: No wheezing or rales.  Musculoskeletal:     Right lower leg: No edema.     Left lower leg: No edema.  Neurological:     Mental Status: She is alert.    Laboratory examination:   No results for input(s): "NA", "K", "CL", "CO2", "GLUCOSE", "BUN", "CREATININE", "CALCIUM", "GFRNONAA", "GFRAA" in the last 8760 hours.  CrCl cannot be calculated (Patient's most recent lab result is older than the maximum 21 days allowed.).     Latest Ref Rng & Units 07/23/2020    5:27 PM 10/17/2019   11:30 AM 01/05/2019    2:31 PM  CMP  Glucose 65 - 99 mg/dL 94  106  110   BUN 8 - 27 mg/dL 12  15  12    Creatinine 0.57 - 1.00 mg/dL 0.85  0.85  0.69   Sodium 134 - 144 mmol/L 141  145  138   Potassium 3.5 - 5.2 mmol/L 4.1  4.1  3.3   Chloride 96 - 106 mmol/L 102  105  102   CO2 20 - 29 mmol/L 24  23  27    Calcium 8.7 - 10.3 mg/dL 9.9  9.5  9.0   Total Protein 6.0 - 8.5 g/dL  7.3    Total Bilirubin 0.0 - 1.2 mg/dL  0.3    Alkaline Phos 39 - 117 IU/L  144    AST 0 - 40 IU/L  17    ALT 0 - 32 IU/L  23        Latest Ref Rng & Units 04/16/2020    3:15 PM 10/17/2019   11:30 AM 01/05/2019    2:31 PM  CBC  WBC 3.4 - 10.8 x10E3/uL 7.2  6.7  6.6   Hemoglobin 11.1 - 15.9 g/dL 15.3  14.9  14.7   Hematocrit 34.0 - 46.6 % 44.0  45.1  44.7   Platelets 150 - 450 x10E3/uL 227  212  216    Lipid Panel     Component Value Date/Time   CHOL 173 10/17/2019 1130   TRIG 75 10/17/2019 1130   HDL 67 10/17/2019 1130   CHOLHDL 2.6 10/17/2019 1130   LDLCALC 92 10/17/2019 1130   HEMOGLOBIN A1C Lab Results  Component Value Date   HGBA1C 5.9 (H) 07/23/2020   TSH No results for input(s): "TSH" in the  last 8760 hours.   External labs:   09/29/2017:  Hemoglobin  A1c 5.7%.  TSH normal.  Vitamin D normal.  CBC normal.  Creatinine 0.7, EGFR 84/97, potassium 4.3, CMP normal.  Cholesterol 192, triglycerides 92, HDL 58, LDL 116.  Allergies   Allergies  Allergen Reactions   Amoxicillin Other (See Comments)    Inflammation in pancreas.  Did it involve swelling of the face/tongue/throat, SOB, or low BP? No Did it involve sudden or severe rash/hives, skin peeling, or any reaction on the inside of your mouth or nose? No Did you need to seek medical attention at a hospital or doctor's office? Yes When did it last happen?      5-6 years ago If all above answers are "NO", may proceed with cephalosporin use.    Penicillins Other (See Comments)    "Inflames pancreas"    Medications Prior to Visit:   Outpatient Medications Prior to Visit  Medication Sig Dispense Refill   acetaminophen (TYLENOL) 650 MG CR tablet Take 1,300 mg by mouth every 8 (eight) hours as needed for pain.     CALCIUM CITRATE PO Take 1 capsule by mouth daily.     celecoxib (CELEBREX) 200 MG capsule Take 1 capsule (200 mg total) by mouth 2 (two) times daily. 60 capsule 2   Cholecalciferol (VITAMIN D3) 125 MCG (5000 UT) CAPS Take 5,000 Units by mouth daily.     cyclobenzaprine (FLEXERIL) 5 MG tablet Take 1 tablet (5 mg total) by mouth 2 (two) times daily. 60 tablet 1   diltiazem (CARDIZEM CD) 240 MG 24 hr capsule TAKE 1 CAPSULE BY MOUTH EVERY DAY 90 capsule 3   diphenhydrAMINE (BENADRYL) 25 MG tablet Take 25 mg by mouth every 6 (six) hours as needed.     esomeprazole (NEXIUM) 20 MG capsule Take 20 mg by mouth daily as needed (heartburn).      fluticasone (FLONASE) 50 MCG/ACT nasal spray Place 1 spray into both nostrils daily as needed for allergies or rhinitis.     gabapentin (NEURONTIN) 300 MG capsule Take 300 mg by mouth 3 (three) times daily.     HYDROcodone-acetaminophen (NORCO/VICODIN) 5-325 MG tablet Take 1 tablet by mouth every 6 (six) hours as needed for moderate pain.      levocetirizine (XYZAL) 5 MG tablet Take 5 mg by mouth daily.      naproxen (NAPROSYN) 500 MG tablet Take 500 mg by mouth 2 (two) times daily with a meal.     NON FORMULARY Take 1 capsule by mouth daily. Aaron [employer]elman Superfood     NON FORMULARY Take 1 capsule by mouth See admin instructions. 7 Dry Cleanse     NON FORMULARY KETO multivitamin     Probiotic Product (RA PROBIOTIC COMPLEX) CAPS Take 1 capsule by mouth daily.     Tetrahydrozoline HCl (VISINE OP) Place 1 drop into both eyes daily as needed (allergies).      ZINC-VITAMIN C PO Take 1 tablet by mouth daily.     rosuvastatin (CRESTOR) 10 MG tablet Take 1 tablet (10 mg total) by mouth daily. 90 tablet 3   Blood Glucose Monitoring Suppl (BLOOD GLUCOSE MONITOR SYSTEM) w/Device KIT 1 each by Does not apply route daily. (Patient not taking: Reported on 02/19/2022) 1 kit 0   Apple Cider Vinegar 600 MG CAPS Take 1 capsule by mouth daily. (Patient not taking: Reported on 02/19/2022)     benzonatate (TESSALON) 200 MG capsule Take 200 mg by mouth 3 (three) times daily as needed. (  Patient not taking: Reported on 02/19/2022)     oxyCODONE-acetaminophen (PERCOCET/ROXICET) 5-325 MG tablet Take 1 tablet by mouth every 6 (six) hours as needed for pain.     sertraline (ZOLOFT) 50 MG tablet TAKE 1 TABLET BY MOUTH EVERY DAY (Patient not taking: Reported on 02/19/2022) 90 tablet 1   No facility-administered medications prior to visit.   Final Medications at End of Visit    Current Meds  Medication Sig   acetaminophen (TYLENOL) 650 MG CR tablet Take 1,300 mg by mouth every 8 (eight) hours as needed for pain.   CALCIUM CITRATE PO Take 1 capsule by mouth daily.   celecoxib (CELEBREX) 200 MG capsule Take 1 capsule (200 mg total) by mouth 2 (two) times daily.   Cholecalciferol (VITAMIN D3) 125 MCG (5000 UT) CAPS Take 5,000 Units by mouth daily.   cyclobenzaprine (FLEXERIL) 5 MG tablet Take 1 tablet (5 mg total) by mouth 2 (two) times daily.   diltiazem (CARDIZEM CD) 240 MG  24 hr capsule TAKE 1 CAPSULE BY MOUTH EVERY DAY   diphenhydrAMINE (BENADRYL) 25 MG tablet Take 25 mg by mouth every 6 (six) hours as needed.   esomeprazole (NEXIUM) 20 MG capsule Take 20 mg by mouth daily as needed (heartburn).    fluticasone (FLONASE) 50 MCG/ACT nasal spray Place 1 spray into both nostrils daily as needed for allergies or rhinitis.   gabapentin (NEURONTIN) 300 MG capsule Take 300 mg by mouth 3 (three) times daily.   HYDROcodone-acetaminophen (NORCO/VICODIN) 5-325 MG tablet Take 1 tablet by mouth every 6 (six) hours as needed for moderate pain.   levocetirizine (XYZAL) 5 MG tablet Take 5 mg by mouth daily.    naproxen (NAPROSYN) 500 MG tablet Take 500 mg by mouth 2 (two) times daily with a meal.   NON FORMULARY Take 1 capsule by mouth daily. Aaron Edelman Superfood   NON FORMULARY Take 1 capsule by mouth See admin instructions. 7 Dry Cleanse   NON FORMULARY KETO multivitamin   Probiotic Product (RA PROBIOTIC COMPLEX) CAPS Take 1 capsule by mouth daily.   Tetrahydrozoline HCl (VISINE OP) Place 1 drop into both eyes daily as needed (allergies).    ZINC-VITAMIN C PO Take 1 tablet by mouth daily.   [DISCONTINUED] rosuvastatin (CRESTOR) 10 MG tablet Take 1 tablet (10 mg total) by mouth daily.   Radiology:   No results found.  Cardiac Studies:   Exercise sestamibi stress test 07/21/2018:  1. The patient performed treadmill exercise using Bruce protocol, completing 6:45 minutes. The patient completed an estimated workload of 8.2 METS, reaching 87% of the maximum predicted heart rate. Resting hypertension 150/100 mmHg with exaggerated exercise BP response, peak BP 230/110 mmHg. No stress symptoms reported. Exercise capacity was normal. No ischemic changes seen on stress electrocardiogram.   2. The overall quality of the study is excellent. There is no evidence of abnormal lung activity. Stress and rest SPECT images demonstrate homogeneous tracer distribution throughout the myocardium. Gated  SPECT imaging reveals normal myocardial thickening and wall motion. The left ventricular ejection fraction was normal (73%).  3. Low risk study.  PCV ECHOCARDIOGRAM COMPLETE 18/29/9371 Normal LV systolic function with visual EF 60-65%. Left ventricle cavity is normal in size. Normal left ventricular wall thickness. Normal global wall motion. Normal diastolic filling pattern, normal LAP. Mild (Grade I) mitral regurgitation. Compared to study 07/05/2018 no significant change.   EKG  02/19/2022: Sinus rhythm at a rate of 72 bpm.  Normal axis.  IVCD.  Nonspecific T wave abnormality.  No evidence of ischemia or underlying injury pattern.  EKG 12/18/2020: Normal sinus rhythm at a rate of 70 bpm.  Normal axis.  Incomplete right bundle branch block.  Poor R wave progression, cannot exclude anteroseptal infarct old.  Compared to EKG 10/11/2019, PRWP new.  EKG 10/10/2018: Normal sinus rhythm at rate of 71 bpm, normal axis, no evidence of ischemia, normal EKG.   No significant change from  EKG 05/14/2017: Normal sinus rhythm at the rate of 71 bpm, normal intervals.  No evidence of ischemia, normal EKG.   Assessment     ICD-10-CM   1. Supraventricular tachycardia, paroxysmal (HCC)  I47.1 EKG 12-Lead    2. Nonrheumatic mitral valve regurgitation  I34.0     3. Pure hypercholesterolemia  E78.00 Lipid Panel With LDL/HDL Ratio      Meds ordered this encounter  Medications   sertraline (ZOLOFT) 50 MG tablet    Sig: Take 1 tablet (50 mg total) by mouth daily.    Dispense:  30 tablet    Refill:  0   rosuvastatin (CRESTOR) 10 MG tablet    Sig: Take 1 tablet (10 mg total) by mouth daily.    Dispense:  90 tablet    Refill:  3    Medications Discontinued During This Encounter  Medication Reason   oxyCODONE-acetaminophen (PERCOCET/ROXICET) 5-325 MG tablet    Apple Cider Vinegar 600 MG CAPS    benzonatate (TESSALON) 200 MG capsule    sertraline (ZOLOFT) 50 MG tablet Patient has not taken in last 30 days    rosuvastatin (CRESTOR) 10 MG tablet Reorder     Recommendations:   LILYONNA STEIDLE  is a 65 y.o.  female  with  hypertension, bronchial ashtma, hyperlipidemia, and hyperglycemia seen in the emergency room on 05/07/2017 with SVT at the rate of 172 bpm with secondary ST-T wave changes in the inferior and lateral leads suggestive  of ischemia. She converted to sinus rhythm with adenosine was and discharged home. Was evaluated by EP and she preferred medical therapy due to fear of pacemaker implantation. Now on Diltiazem and states she has not had any further episodes.  Patient was last seen in the office 02/24/2021 at which time ordered repeat echocardiogram.  Echocardiogram revealed preserved LVEF with mild mitral regurgitation, unchanged compared to 2019.  Patient now presents for annual follow-up.  EKG and physical exam remained stable compared to previous office visit.  Although blood pressure is elevated in the office today it is typically well controlled.  Recommended Dash diet and advised patient to monitor blood pressure daily at home and notify our office if it is >130/80 mmHg on average.  Overall she is feeling well and stable from a cardiovascular standpoint.  We will obtain repeat lipid profile testing.  Patient is scheduled to see PCP for follow-up shortly.  Follow-up in 1 year, sooner if needed.   Alethia Berthold, PA-C 02/19/2022, 2:30 PM Office: [phone]

## 2022-02-23 ENCOUNTER — Ambulatory Visit: Payer: Self-pay

## 2022-03-04 ENCOUNTER — Ambulatory Visit: Payer: Self-pay

## 2022-03-05 ENCOUNTER — Ambulatory Visit
Admission: RE | Admit: 2022-03-05 | Discharge: 2022-03-05 | Disposition: A | Discharge status: Home / Self Care | Payer: Self-pay | Source: Ambulatory Visit | Attending: Obstetrics and Gynecology | Admitting: Obstetrics and Gynecology

## 2022-03-05 DIAGNOSIS — Z1231 Encounter for screening mammogram for malignant neoplasm of breast: Secondary | ICD-10-CM

## 2022-04-05 ENCOUNTER — Encounter: Payer: Self-pay | Admitting: Nurse Practitioner

## 2022-04-05 ENCOUNTER — Ambulatory Visit (INDEPENDENT_AMBULATORY_CARE_PROVIDER_SITE_OTHER): Payer: Medicare HMO | Admitting: Nurse Practitioner

## 2022-04-05 VITALS — BP 138/76 | HR 86 | Temp 98.0°F | Ht 64.0 in | Wt 175.0 lb

## 2022-04-05 DIAGNOSIS — F419 Anxiety disorder, unspecified: Secondary | ICD-10-CM | POA: Diagnosis not present

## 2022-04-05 DIAGNOSIS — Z79899 Other long term (current) drug therapy: Secondary | ICD-10-CM | POA: Diagnosis not present

## 2022-04-05 DIAGNOSIS — R7303 Prediabetes: Secondary | ICD-10-CM

## 2022-04-05 DIAGNOSIS — I10 Essential (primary) hypertension: Secondary | ICD-10-CM | POA: Diagnosis not present

## 2022-04-05 DIAGNOSIS — E2839 Other primary ovarian failure: Secondary | ICD-10-CM | POA: Diagnosis not present

## 2022-04-05 DIAGNOSIS — R5383 Other fatigue: Secondary | ICD-10-CM

## 2022-04-05 DIAGNOSIS — Z2821 Immunization not carried out because of patient refusal: Secondary | ICD-10-CM

## 2022-04-05 DIAGNOSIS — Z0000 Encounter for general adult medical examination without abnormal findings: Secondary | ICD-10-CM

## 2022-04-05 DIAGNOSIS — R413 Other amnesia: Secondary | ICD-10-CM | POA: Diagnosis not present

## 2022-04-05 DIAGNOSIS — E782 Mixed hyperlipidemia: Secondary | ICD-10-CM

## 2022-04-05 DIAGNOSIS — Z23 Encounter for immunization: Secondary | ICD-10-CM | POA: Diagnosis not present

## 2022-04-05 LAB — POCT URINALYSIS DIPSTICK
Bilirubin, UA: NEGATIVE
Blood, UA: NEGATIVE
Glucose, UA: NEGATIVE
Ketones, UA: NEGATIVE
Leukocytes, UA: NEGATIVE
Nitrite, UA: NEGATIVE
Protein, UA: NEGATIVE
Spec Grav, UA: >=1.03 — AB (ref 1.010–1.025)
Urobilinogen, UA: 0.2 E.U./dL
pH, UA: 5.5 (ref 5.0–8.0)

## 2022-04-05 NOTE — Patient Instructions (Addendum)

## 2022-04-05 NOTE — Progress Notes (Signed)
Barnet Glasgow Martin,acting as a Education administrator for Minette Brine, FNP.,have documented all relevant documentation on the behalf of Minette Brine, FNP,as directed by  Minette Brine, FNP while in the presence of Minette Brine, [place].   Subjective:     Patient ID: Kathryn Gallegos , female    DOB: [birth year] , 66 y.o.   MRN: [id number]   Chief Complaint  Patient presents with   Annual Exam    HPI  Patient presents today for HM, Patient states compliance with medications. Patient has no other issues today. Patient goes to [employer]. She has not been on metformin in a while, has not been in the office in 2 years.   BP Readings from Last 3 Encounters: 04/05/22 : 138/76 02/19/22 : (!) 144/84 04/30/21 : 136/85    Hypertension This is a chronic problem. The current episode started more than 1 year ago. The problem is unchanged. The problem is controlled. Pertinent negatives include no anxiety. Risk factors for coronary artery disease include obesity and sedentary lifestyle. There are no compliance problems.  There is no history of chronic renal disease.     Past Medical History:  Diagnosis Date   Anxiety    Anxiety and depression    Asthma    Borderline diabetes    Carpal tunnel syndrome 07/05/2007   Qualifier: Diagnosis of  By: [name], [place]     Dyspnea on exertion 09/22/2018   HIGH BLOOD PRESSURE 07/04/2007   Qualifier: Diagnosis of  By: [name], [name]     HTN (hypertension)    Hyperglycemia    Hyperlipidemia    IMPINGEMENT SYNDROME 03/09/2010   Qualifier: Diagnosis of  By: [name], [name]     Obesity    PSVT (paroxysmal supraventricular tachycardia) ([place]) 09/22/2018   Pure hypercholesterolemia 09/22/2018   Rapid palpitations    Seasonal allergies      Family History  Problem Relation Age of Onset   Pancreatic cancer Mother    Esophageal cancer Maternal Grandmother    Heart disease Maternal Grandfather    Heart disease Maternal Uncle    Colon cancer Maternal  Aunt    Stomach cancer Maternal Aunt    Arthritis Other    Cancer Other    Diabetes Other    Breast cancer Neg Hx      Current Outpatient Medications:    acetaminophen (TYLENOL) 650 MG CR tablet, Take 1,300 mg by mouth every 8 (eight) hours as needed for pain., Disp: , Rfl:    CALCIUM CITRATE PO, Take 1 capsule by mouth daily., Disp: , Rfl:    celecoxib (CELEBREX) 200 MG capsule, Take 1 capsule (200 mg total) by mouth 2 (two) times daily., Disp: 60 capsule, Rfl: 2   Cholecalciferol (VITAMIN D3) 125 MCG (5000 UT) CAPS, Take 5,000 Units by mouth daily., Disp: , Rfl:    cyclobenzaprine (FLEXERIL) 5 MG tablet, Take 1 tablet (5 mg total) by mouth 2 (two) times daily., Disp: 60 tablet, Rfl: 1   diltiazem (CARDIZEM CD) 240 MG 24 hr capsule, TAKE 1 CAPSULE BY MOUTH EVERY DAY, Disp: 90 capsule, Rfl: 3   esomeprazole (NEXIUM) 20 MG capsule, Take 20 mg by mouth daily as needed (heartburn). , Disp: , Rfl:    fluticasone (FLONASE) 50 MCG/ACT nasal spray, Place 1 spray into both nostrils daily as needed for allergies or rhinitis., Disp: , Rfl:    HYDROcodone-acetaminophen (NORCO/VICODIN) 5-325 MG tablet, Take 1 tablet by mouth every 6 (six) hours as needed for moderate  pain., Disp: , Rfl:    levocetirizine (XYZAL) 5 MG tablet, Take 5 mg by mouth daily. , Disp: , Rfl:    naproxen (NAPROSYN) 500 MG tablet, Take 500 mg by mouth 2 (two) times daily with a meal., Disp: , Rfl:    NON FORMULARY, Take 1 capsule by mouth daily. Aaron Edelman Superfood, Disp: , Rfl:    NON FORMULARY, Take 1 capsule by mouth See admin instructions. 7 Dry Cleanse, Disp: , Rfl:    NON FORMULARY, KETO multivitamin, Disp: , Rfl:    Probiotic Product (RA PROBIOTIC COMPLEX) CAPS, Take 1 capsule by mouth daily., Disp: , Rfl:    rosuvastatin (CRESTOR) 10 MG tablet, Take 1 tablet (10 mg total) by mouth daily., Disp: 90 tablet, Rfl: 3   sertraline (ZOLOFT) 50 MG tablet, Take 1 tablet (50 mg total) by mouth daily., Disp: 30 tablet, Rfl: 0    Tetrahydrozoline HCl (VISINE OP), Place 1 drop into both eyes daily as needed (allergies). , Disp: , Rfl:    ZINC-VITAMIN C PO, Take 1 tablet by mouth daily., Disp: , Rfl:    Blood Glucose Monitoring Suppl (BLOOD GLUCOSE MONITOR SYSTEM) w/Device KIT, 1 each by Does not apply route daily. (Patient not taking: Reported on 02/19/2022), Disp: 1 kit, Rfl: 0   diphenhydrAMINE (BENADRYL) 25 MG tablet, Take 25 mg by mouth every 6 (six) hours as needed. (Patient not taking: Reported on 04/05/2022), Disp: , Rfl:    gabapentin (NEURONTIN) 300 MG capsule, Take 300 mg by mouth 3 (three) times daily. (Patient not taking: Reported on 04/05/2022), Disp: , Rfl:    Allergies  Allergen Reactions   Amoxicillin Other (See Comments)    Inflammation in pancreas.  Did it involve swelling of the face/tongue/throat, SOB, or low BP? No Did it involve sudden or severe rash/hives, skin peeling, or any reaction on the inside of your mouth or nose? No Did you need to seek medical attention at a hospital or doctor's office? Yes When did it last happen?      5-6 years ago If all above answers are "NO", may proceed with cephalosporin use.    Penicillins Other (See Comments)    "Inflames pancreas"      The patient states she is status post hysterectomy. Negative for: breast discharge, breast lump(s), breast pain and breast self exam. Associated symptoms include abnormal vaginal bleeding. Pertinent negatives include abnormal bleeding (hematology), anxiety, decreased libido, depression, difficulty falling sleep, dyspareunia, history of infertility, nocturia, sexual dysfunction, sleep disturbances, urinary incontinence, urinary urgency, vaginal discharge and vaginal itching. Diet regular; she has cut down on her portions, she is no longer drinking sodas and drinking more water.  She is eating more broccoli and fish. The patient states her exercise level is minimal with walking and will do sit ups.   The patient's tobacco use is:   Social History   Tobacco Use  Smoking Status Former   Packs/day: 0.25   Years: 2.00   Total pack years: 0.50   Types: Cigarettes   Quit date: 1970   Years since quitting: 53.6  Smokeless Tobacco Never   She has been exposed to passive smoke. The patient's alcohol use is:  Social History   Substance and Sexual Activity  Alcohol Use No   Additional information: Last pap 09/15/2018, next one scheduled for not scheduled at this time will call [facility] for an appt.    Review of Systems  Constitutional: Negative.   HENT: Negative.    Eyes: Negative.  Respiratory: Negative.    Cardiovascular: Negative.   Gastrointestinal: Negative.   Endocrine: Negative.   Genitourinary: Negative.   Musculoskeletal: Negative.   Skin: Negative.   Allergic/Immunologic: Negative.   Neurological: Negative.   Hematological: Negative.   Psychiatric/Behavioral: Negative.       Today's Vitals   04/05/22 0855  BP: 138/76  Pulse: 86  Temp: 98 F (36.7 C)  TempSrc: Oral  SpO2: 96%  Weight: 175 lb (79.4 kg)  Height: 5' 4"  (1.626 m)  PainSc: 7   PainLoc: Arm   Body mass index is 30.04 kg/m.  Wt Readings from Last 3 Encounters:  04/05/22 175 lb (79.4 kg)  02/19/22 178 lb (80.7 kg)  04/30/21 175 lb (79.4 kg)     Objective:  Physical Exam Constitutional:      General: She is not in acute distress.    Appearance: Normal appearance. She is well-developed.  HENT:     Head: Normocephalic and atraumatic.     Right Ear: Hearing, tympanic membrane, ear canal and external ear normal. There is no impacted cerumen.     Left Ear: Hearing, tympanic membrane, ear canal and external ear normal. There is no impacted cerumen.     Nose:     Comments: Deferred - mask    Mouth/Throat:     Comments: Deferred - mask  Eyes:     General: Lids are normal.     Conjunctiva/sclera: Conjunctivae normal.     Pupils: Pupils are equal, round, and reactive to light.     Funduscopic exam:    Right eye: No  papilledema.        Left eye: No papilledema.  Neck:     Thyroid: No thyroid mass.     Vascular: No carotid bruit.  Cardiovascular:     Rate and Rhythm: Normal rate and regular rhythm.     Pulses: Normal pulses.     Heart sounds: Normal heart sounds. No murmur heard. Pulmonary:     Effort: Pulmonary effort is normal. No respiratory distress.     Breath sounds: Normal breath sounds. No wheezing.  Abdominal:     General: Abdomen is flat. Bowel sounds are normal.     Palpations: Abdomen is soft.  Genitourinary:    Comments: Deferred - followed by [facility] Musculoskeletal:        General: No swelling. Normal range of motion.     Cervical back: Full passive range of motion without pain, normal range of motion and neck supple.     Right lower leg: No edema.     Left lower leg: No edema.  Skin:    General: Skin is warm and dry.     Capillary Refill: Capillary refill takes less than 2 seconds.  Neurological:     General: No focal deficit present.     Mental Status: She is alert and oriented to person, place, and time.     Cranial Nerves: No cranial nerve deficit.     Sensory: No sensory deficit.     Motor: No weakness.  Psychiatric:        Mood and Affect: Mood normal.        Behavior: Behavior normal.        Thought Content: Thought content normal.        Judgment: Judgment normal.         Assessment And Plan:     1. Annual physical exam Behavior modifications discussed and diet history reviewed.   Pt will  continue to exercise regularly and modify diet with low GI, plant based foods and decrease intake of processed foods.  Recommend intake of daily multivitamin, Vitamin D, and calcium.  Recommend mammogram and colonoscopy for preventive screenings, as well as recommend immunizations that include influenza, TDAP, and Shingles (1st injection today)  2. Other long term (current) drug therapy - CBC no Diff  3. Encounter for immunization Comments: Shingrix #1  given in office after TransRx form completed, repeat in 2 months [place]  - Varicella-zoster vaccine IM  4. Pneumococcal vaccination declined She will get at her next visit once she is done with her Shingrix series  5. Essential hypertension Comments: Blood pressure is fairly controlled, continue current medications and follow up with Cardiology - Microalbumin / Creatinine Urine Ratio - POCT Urinalysis Dipstick (81002) - CMP14+EGFR  6. Prediabetes Comments: HgbA1c was at 6.1 at last visit 2 years ago, will check levels today. No current medications was on metformin.  - Microalbumin / Creatinine Urine Ratio - POCT Urinalysis Dipstick (81002) - CMP14+EGFR - Hemoglobin A1c  7. Mixed hyperlipidemia Comments: Cholesterol levels are stable, continue statin, tolerating well  - CMP14+EGFR - Lipid panel  8. Decreased estrogen level - DG Bone Density; Future  9. Other fatigue Comments: Will check for metabolic causes.  - TSH - Vitamin B12  10. Anxiety Comments: No issues with anxiet at this time.      Patient was given opportunity to ask questions. Patient verbalized understanding of the plan and was able to repeat key elements of the plan. All questions were answered to their satisfaction.   Minette Brine, FNP   I, Minette Brine, FNP, have reviewed all documentation for this visit. The documentation on 04/05/22 for the exam, diagnosis, procedures, and orders are all accurate and complete.   THE PATIENT IS ENCOURAGED TO PRACTICE SOCIAL DISTANCING DUE TO THE COVID-19 PANDEMIC.

## 2022-04-06 LAB — CMP14+EGFR
ALT: 26 IU/L (ref 0–32)
AST: 24 IU/L (ref 0–40)
Albumin/Globulin Ratio: 1.8 (ref 1.2–2.2)
Albumin: 4.6 g/dL (ref 3.9–4.9)
Alkaline Phosphatase: 133 IU/L — ABNORMAL HIGH (ref 44–121)
BUN/Creatinine Ratio: 20 (ref 12–28)
BUN: 18 mg/dL (ref 8–27)
Bilirubin Total: 0.5 mg/dL (ref 0.0–1.2)
CO2: 26 mmol/L (ref 20–29)
Calcium: 10 mg/dL (ref 8.7–10.3)
Chloride: 102 mmol/L (ref 96–106)
Creatinine, Ser: 0.91 mg/dL (ref 0.57–1.00)
Globulin, Total: 2.5 g/dL (ref 1.5–4.5)
Glucose: 110 mg/dL — ABNORMAL HIGH (ref 70–99)
Potassium: 4.5 mmol/L (ref 3.5–5.2)
Sodium: 143 mmol/L (ref 134–144)
Total Protein: 7.1 g/dL (ref 6.0–8.5)
eGFR: 70 mL/min/{1.73_m2} (ref >59)

## 2022-04-06 LAB — CBC
Hematocrit: 47.4 % — ABNORMAL HIGH (ref 34.0–46.6)
Hemoglobin: 15.9 g/dL (ref 11.1–15.9)
MCH: 29.7 pg (ref 26.6–33.0)
MCHC: 33.5 g/dL (ref 31.5–35.7)
MCV: 89 fL (ref 79–97)
Platelets: 207 10*3/uL (ref 150–450)
RBC: 5.35 x10E6/uL — ABNORMAL HIGH (ref 3.77–5.28)
RDW: 13.1 % (ref 11.7–15.4)
WBC: 5.5 10*3/uL (ref 3.4–10.8)

## 2022-04-06 LAB — HEMOGLOBIN A1C
Est. average glucose Bld gHb Est-mCnc: 126 mg/dL
Hgb A1c MFr Bld: 6 % — ABNORMAL HIGH (ref 4.8–5.6)

## 2022-04-06 LAB — LIPID PANEL
Chol/HDL Ratio: 2.2 ratio (ref 0.0–4.4)
Cholesterol, Total: 127 mg/dL (ref 100–199)
HDL: 59 mg/dL (ref >39)
LDL Chol Calc (NIH): 52 mg/dL (ref 0–99)
Triglycerides: 83 mg/dL (ref 0–149)
VLDL Cholesterol Cal: 16 mg/dL (ref 5–40)

## 2022-04-06 LAB — MICROALBUMIN / CREATININE URINE RATIO
Creatinine, Urine: 86.3 mg/dL
Microalb/Creat Ratio: 20 mg/g creat (ref 0–29)
Microalbumin, Urine: 17.6 ug/mL

## 2022-04-06 LAB — TSH: TSH: 1.02 u[IU]/mL (ref 0.450–4.500)

## 2022-04-06 LAB — VITAMIN B12: Vitamin B-12: 562 pg/mL (ref 232–1245)

## 2022-04-14 DIAGNOSIS — H5213 Myopia, bilateral: Secondary | ICD-10-CM | POA: Diagnosis not present

## 2022-04-15 DIAGNOSIS — H524 Presbyopia: Secondary | ICD-10-CM | POA: Diagnosis not present

## 2022-04-21 ENCOUNTER — Other Ambulatory Visit: Payer: Self-pay

## 2022-04-21 DIAGNOSIS — G8929 Other chronic pain: Secondary | ICD-10-CM

## 2022-04-21 DIAGNOSIS — Z9889 Other specified postprocedural states: Secondary | ICD-10-CM

## 2022-04-21 NOTE — Telephone Encounter (Signed)
Celecoxib 200 MG Take 1 capsule (200 mg total) by mouth 2 (two) times daily.   Cyclobenzaprine 5 MG  Take 1 tablet (5 mg total) by mouth 2 (two) times daily.    PATIENT USES [facility] ON [address] IN [place]

## 2022-04-22 MED ORDER — CELECOXIB 200 MG PO CAPS: 200.0000 mg | ORAL_CAPSULE | Freq: Two times a day (BID) | ORAL | 2 refills | Status: DC

## 2022-04-22 MED ORDER — CYCLOBENZAPRINE HCL 5 MG PO TABS: 5.0000 mg | ORAL_TABLET | Freq: Two times a day (BID) | ORAL | 1 refills | Status: DC

## 2022-04-28 DIAGNOSIS — M461 Sacroiliitis, not elsewhere classified: Secondary | ICD-10-CM | POA: Diagnosis not present

## 2022-04-28 DIAGNOSIS — M47812 Spondylosis without myelopathy or radiculopathy, cervical region: Secondary | ICD-10-CM | POA: Diagnosis not present

## 2022-04-28 DIAGNOSIS — Z6830 Body mass index (BMI) 30.0-30.9, adult: Secondary | ICD-10-CM | POA: Diagnosis not present

## 2022-04-30 ENCOUNTER — Encounter: Payer: Self-pay | Admitting: Orthopedic Surgery

## 2022-04-30 ENCOUNTER — Ambulatory Visit (INDEPENDENT_AMBULATORY_CARE_PROVIDER_SITE_OTHER): Payer: Medicare HMO | Admitting: Orthopedic Surgery

## 2022-04-30 VITALS — Ht 64.0 in | Wt 175.0 lb

## 2022-04-30 DIAGNOSIS — G5603 Carpal tunnel syndrome, bilateral upper limbs: Secondary | ICD-10-CM | POA: Diagnosis not present

## 2022-04-30 DIAGNOSIS — R202 Paresthesia of skin: Secondary | ICD-10-CM | POA: Diagnosis not present

## 2022-04-30 DIAGNOSIS — M25511 Pain in right shoulder: Secondary | ICD-10-CM | POA: Diagnosis not present

## 2022-04-30 DIAGNOSIS — G8929 Other chronic pain: Secondary | ICD-10-CM

## 2022-04-30 MED ORDER — GABAPENTIN 100 MG PO CAPS: 100.0000 mg | ORAL_CAPSULE | Freq: Three times a day (TID) | ORAL | 2 refills | Status: DC

## 2022-04-30 NOTE — Patient Instructions (Addendum)
Vitamin B6 over the counter   While we are working on your approval for MRI please go ahead and call to schedule your appointment with [facility] within at least one (1) week.   [phone]

## 2022-04-30 NOTE — Progress Notes (Signed)
Chief Complaint  Patient presents with   Hand Pain    Bilat hand pain feels like someone is sticking pins in her fingers and they go numb for 1 mo.    Patient ID: Kathryn Gallegos, female   DOB: 1955/11/28, 66 y.o.   MRN: [id number]  ASSESSMENT AND PLAN   66 year old female with cervical spine disease, chronic pain after rotator cuff surgery comes in with recurrent symptoms of carpal tunnel syndrome which include burning tingling numbness worse at night both hands  Recommend bilateral carpal tunnel splinting at night Gabapentin 100 mg 3 times a day Vitamin B6 100 mg twice a day Return 4 weeks  The patient is interested in carpal tunnel injections which worked for her in the past  Chief Complaint  Patient presents with   Hand Pain    Bilat hand pain feels like someone is sticking pins in her fingers and they go numb for 1 mo.     HPI Kathryn Gallegos is a 66 y.o. female.  66 year old female with cervical spine disease, chronic pain after rotator cuff surgery comes in with recurrent symptoms of carpal tunnel syndrome which include burning tingling numbness worse at night both hands  Review of Systems Review of Systems  Constitutional: Negative.   Neurological:  Positive for numbness.  Hematological: Negative.      Past Medical History:  Diagnosis Date   Anxiety    Anxiety and depression    Asthma    Borderline diabetes    Carpal tunnel syndrome 07/05/2007   Qualifier: Diagnosis of  By: [name], Mills Mitton     Dyspnea on exertion 09/22/2018   HIGH BLOOD PRESSURE 07/04/2007   Qualifier: Diagnosis of  By: [name], Rubye Strohmeyer     HTN (hypertension)    Hyperglycemia    Hyperlipidemia    IMPINGEMENT SYNDROME 03/09/2010   Qualifier: Diagnosis of  By: [name], Carly Applegate     Obesity    PSVT (paroxysmal supraventricular tachycardia) ([place]) 09/22/2018   Pure hypercholesterolemia 09/22/2018   Rapid palpitations    Seasonal allergies     Past Surgical History:  Procedure  Laterality Date   BREAST BIOPSY Left    SHOULDER ARTHROSCOPY WITH BICEPSTENOTOMY Right 01/09/2019   Procedure: SHOULDER ARTHROSCOPY WITH biceps tenotomy, limited debriedment;  Surgeon: [name], MD;  Location: [facility];  Service: Orthopedics;  Laterality: Right;   TOTAL ABDOMINAL HYSTERECTOMY     TUBAL LIGATION      Family History  Problem Relation Age of Onset   Pancreatic cancer Mother    Esophageal cancer Maternal Grandmother    Heart disease Maternal Grandfather    Heart disease Maternal Uncle    Colon cancer Maternal Aunt    Stomach cancer Maternal Aunt    Arthritis Other    Cancer Other    Diabetes Other    Breast cancer Neg Hx      Social History   Tobacco Use   Smoking status: Former    Packs/day: 0.25    Years: 2.00    Total pack years: 0.50    Types: Cigarettes    Quit date: 1970    Years since quitting: 53.7   Smokeless tobacco: Never  Vaping Use   Vaping Use: Never used  Substance Use Topics   Alcohol use: No   Drug use: No    Allergies  Allergen Reactions   Amoxicillin Other (See Comments)    Inflammation in pancreas.  Did it involve swelling of the face/tongue/throat, SOB,  or low BP? No Did it involve sudden or severe rash/hives, skin peeling, or any reaction on the inside of your mouth or nose? No Did you need to seek medical attention at a hospital or doctor's office? Yes When did it last happen?      5-6 years ago If all above answers are "NO", may proceed with cephalosporin use.    Penicillins Other (See Comments)    "Inflames pancreas"    Current Outpatient Medications  Medication Instructions   acetaminophen (TYLENOL) 1,300 mg, Oral, Every 8 hours PRN   Blood Glucose Monitoring Suppl (BLOOD GLUCOSE MONITOR SYSTEM) w/Device KIT 1 each, Does not apply, Daily   CALCIUM CITRATE PO 1 capsule, Oral, Daily   celecoxib (CELEBREX) 200 mg, Oral, 2 times daily   cyclobenzaprine (FLEXERIL) 5 mg, Oral, 2 times daily   diltiazem (CARDIZEM  CD) 240 MG 24 hr capsule TAKE 1 CAPSULE BY MOUTH EVERY DAY   diphenhydrAMINE (BENADRYL) 25 mg, Every 6 hours PRN   esomeprazole (NEXIUM) 20 mg, Oral, Daily PRN   fluticasone (FLONASE) 50 MCG/ACT nasal spray 1 spray, Each Nare, Daily PRN   gabapentin (NEURONTIN) 100 mg, Oral, 3 times daily   HYDROcodone-acetaminophen (NORCO/VICODIN) 5-325 MG tablet 1 tablet, Oral, Every 6 hours PRN   levocetirizine (XYZAL) 5 mg, Oral, Daily   naproxen (NAPROSYN) 500 mg, Oral, 2 times daily with meals   NON FORMULARY 1 capsule, Oral, Daily, Brian Superfood   NON FORMULARY 1 capsule, Oral, See admin instructions, 7 Dry Cleanse   NON FORMULARY KETO multivitamin   Probiotic Product (RA PROBIOTIC COMPLEX) CAPS 1 capsule, Oral, Daily   rosuvastatin (CRESTOR) 10 mg, Oral, Daily   sertraline (ZOLOFT) 50 mg, Oral, Daily   Tetrahydrozoline HCl (VISINE OP) 1 drop, Both Eyes, Daily PRN   Vitamin D3 5,000 Units, Oral, Daily   ZINC-VITAMIN C PO 1 tablet, Oral, Daily       Physical Exam Ht 5' 4"  (1.626 m)   Wt 175 lb (79.4 kg)   BMI 30.04 kg/m   The patient is well developed well nourished and well groomed.  Orientation to person place and time is normal  Mood is pleasant.  Ambulatory status normal gait and stance   Left and right upper extremity examination reveals the following:  SWELLING none TENDERNESS OVER THE CARPAL TUNNEL positive  Range of motion of the wrist normal  Motor exam normal  Wrist joint is stable  Provocative tests for carpal tunnel Phalen's test positive Carpal tunnel compression test upon Tinel's test positive Two-point discrimination test reveals abnormalities in the thumb and index fingers  Pulses are normal in the radial and ulnar artery with a normal Allen's test.    Plan   MEDICAL DECISION SECTION   XRAYS: No imaging  Encounter Diagnoses  Name Primary?   Chronic right shoulder pain    Paresthesia of both hands Yes     PLAN:   Wrist splint Gabapentin   Vit B 6  F/U 4 weeks

## 2022-05-11 DIAGNOSIS — M461 Sacroiliitis, not elsewhere classified: Secondary | ICD-10-CM | POA: Diagnosis not present

## 2022-05-18 ENCOUNTER — Ambulatory Visit
Admission: RE | Admit: 2022-05-18 | Discharge: 2022-05-18 | Disposition: A | Discharge status: Home / Self Care | Payer: Medicare HMO | Source: Ambulatory Visit | Attending: Orthopedic Surgery | Admitting: Orthopedic Surgery

## 2022-05-18 DIAGNOSIS — M25511 Pain in right shoulder: Secondary | ICD-10-CM | POA: Diagnosis not present

## 2022-05-18 DIAGNOSIS — G8929 Other chronic pain: Secondary | ICD-10-CM

## 2022-05-20 ENCOUNTER — Ambulatory Visit (INDEPENDENT_AMBULATORY_CARE_PROVIDER_SITE_OTHER): Payer: Medicare HMO

## 2022-05-20 VITALS — BP 138/70 | HR 98 | Temp 98.5°F | Ht 64.0 in | Wt 175.0 lb

## 2022-05-20 DIAGNOSIS — Z23 Encounter for immunization: Secondary | ICD-10-CM

## 2022-05-20 NOTE — Progress Notes (Signed)
Patient presents today for 2nd shingrix.

## 2022-05-25 ENCOUNTER — Ambulatory Visit: Payer: Medicare HMO

## 2022-05-28 ENCOUNTER — Ambulatory Visit: Payer: Self-pay | Admitting: Orthopedic Surgery

## 2022-06-02 ENCOUNTER — Other Ambulatory Visit: Payer: Self-pay

## 2022-06-02 MED ORDER — SERTRALINE HCL 50 MG PO TABS: 50.0000 mg | ORAL_TABLET | Freq: Every day | ORAL | 0 refills | Status: DC

## 2022-06-03 ENCOUNTER — Ambulatory Visit (INDEPENDENT_AMBULATORY_CARE_PROVIDER_SITE_OTHER): Payer: Medicare HMO | Admitting: Orthopedic Surgery

## 2022-06-03 ENCOUNTER — Other Ambulatory Visit: Payer: Self-pay | Admitting: Radiology

## 2022-06-03 ENCOUNTER — Encounter: Payer: Self-pay | Admitting: Orthopedic Surgery

## 2022-06-03 DIAGNOSIS — Z9889 Other specified postprocedural states: Secondary | ICD-10-CM | POA: Diagnosis not present

## 2022-06-03 DIAGNOSIS — G8929 Other chronic pain: Secondary | ICD-10-CM

## 2022-06-03 DIAGNOSIS — M25511 Pain in right shoulder: Secondary | ICD-10-CM

## 2022-06-03 DIAGNOSIS — M47812 Spondylosis without myelopathy or radiculopathy, cervical region: Secondary | ICD-10-CM | POA: Diagnosis not present

## 2022-06-03 DIAGNOSIS — R202 Paresthesia of skin: Secondary | ICD-10-CM

## 2022-06-03 MED ORDER — HYDROCODONE-ACETAMINOPHEN 5-325 MG PO TABS: 1.0000 | ORAL_TABLET | Freq: Four times a day (QID) | ORAL | 0 refills | Status: DC | PRN

## 2022-06-03 NOTE — Progress Notes (Signed)
Chief Complaint  Patient presents with   Carpal Tunnel    Bilateral    Results    Review MRI shoulder right     Encounter Diagnoses  Name Primary?   Chronic right shoulder pain Yes   Paresthesia of both hands    S/P arthroscopy of right shoulder 01/09/19    Cervical spondylosis without myelopathy     The carpal tunnel symptoms have improved with splinting  Right shoulder pain was evaluated with the MRI.  Kathryn Gallegos comes back after we finally got her MRI.  I read her MRI.  She has no surgical tears of the rotator cuff  She has some interstitial tears which would not require surgery.  She does have glenohumeral arthritis  I talked her about intra-articular injection and she agreed although she wants to think about it and read up on it first  Right now her neck is hurting her and she is scheduled to get epidural injections.  She was able to get 2 injections in her back which helped her back pain  She will call us back when ready to discuss the intra-articular injection right shoulder further

## 2022-06-03 NOTE — Telephone Encounter (Signed)
Wants refill Hydrocodone Kathryn Gallegos

## 2022-06-03 NOTE — Patient Instructions (Signed)
Call is when ready for intra-articular injection right shoulder

## 2022-06-24 ENCOUNTER — Ambulatory Visit: Payer: Self-pay

## 2022-06-24 ENCOUNTER — Ambulatory Visit (INDEPENDENT_AMBULATORY_CARE_PROVIDER_SITE_OTHER): Payer: Medicare HMO | Admitting: Nurse Practitioner

## 2022-06-24 VITALS — BP 122/80 | HR 70 | Temp 98.3°F | Ht 64.0 in | Wt 171.8 lb

## 2022-06-24 DIAGNOSIS — E782 Mixed hyperlipidemia: Secondary | ICD-10-CM | POA: Diagnosis not present

## 2022-06-24 DIAGNOSIS — R7303 Prediabetes: Secondary | ICD-10-CM

## 2022-06-24 DIAGNOSIS — R5383 Other fatigue: Secondary | ICD-10-CM | POA: Diagnosis not present

## 2022-06-24 DIAGNOSIS — Z01419 Encounter for gynecological examination (general) (routine) without abnormal findings: Secondary | ICD-10-CM

## 2022-06-24 DIAGNOSIS — I10 Essential (primary) hypertension: Secondary | ICD-10-CM

## 2022-06-24 DIAGNOSIS — E559 Vitamin D deficiency, unspecified: Secondary | ICD-10-CM | POA: Diagnosis not present

## 2022-06-24 DIAGNOSIS — R748 Abnormal levels of other serum enzymes: Secondary | ICD-10-CM

## 2022-06-24 DIAGNOSIS — Z23 Encounter for immunization: Secondary | ICD-10-CM | POA: Diagnosis not present

## 2022-06-24 MED ORDER — SERTRALINE HCL 50 MG PO TABS: 50.0000 mg | ORAL_TABLET | Freq: Every day | ORAL | 1 refills | Status: AC

## 2022-06-24 NOTE — Patient Instructions (Signed)
Hypertension, Adult ?Hypertension is another name for high blood pressure. High blood pressure forces your heart to work harder to pump blood. This can cause problems over time. ?There are two numbers in a blood pressure reading. There is a top number (systolic) over a bottom number (diastolic). It is best to have a blood pressure that is below 120/80. ?What are the causes? ?The cause of this condition is not known. Some other conditions can lead to high blood pressure. ?What increases the risk? ?Some lifestyle factors can make you more likely to develop high blood pressure: ?Smoking. ?Not getting enough exercise or physical activity. ?Being overweight. ?Having too much fat, sugar, calories, or salt (sodium) in your diet. ?Drinking too much alcohol. ?Other risk factors include: ?Having any of these conditions: ?Heart disease. ?Diabetes. ?High cholesterol. ?Kidney disease. ?Obstructive sleep apnea. ?Having a family history of high blood pressure and high cholesterol. ?Age. The risk increases with age. ?Stress. ?What are the signs or symptoms? ?High blood pressure may not cause symptoms. Very high blood pressure (hypertensive crisis) may cause: ?Headache. ?Fast or uneven heartbeats (palpitations). ?Shortness of breath. ?Nosebleed. ?Vomiting or feeling like you may vomit (nauseous). ?Changes in how you see. ?Very bad chest pain. ?Feeling dizzy. ?Seizures. ?How is this treated? ?This condition is treated by making healthy lifestyle changes, such as: ?Eating healthy foods. ?Exercising more. ?Drinking less alcohol. ?Your doctor may prescribe medicine if lifestyle changes do not help enough and if: ?Your top number is above 130. ?Your bottom number is above 80. ?Your personal target blood pressure may vary. ?Follow these instructions at home: ?Eating and drinking ? ?If told, follow the DASH eating plan. To follow this plan: ?Fill one half of your plate at each meal with fruits and vegetables. ?Fill one fourth of your plate  at each meal with whole grains. Whole grains include whole-wheat pasta, brown rice, and whole-grain bread. ?Eat or drink low-fat dairy products, such as skim milk or low-fat yogurt. ?Fill one fourth of your plate at each meal with low-fat (lean) proteins. Low-fat proteins include fish, chicken without skin, eggs, beans, and tofu. ?Avoid fatty meat, cured and processed meat, or chicken with skin. ?Avoid pre-made or processed food. ?Limit the amount of salt in your diet to less than 1,500 mg each day. ?Do not drink alcohol if: ?Your doctor tells you not to drink. ?You are pregnant, may be pregnant, or are planning to become pregnant. ?If you drink alcohol: ?Limit how much you have to: ?0-1 drink a day for women. ?0-2 drinks a day for men. ?Know how much alcohol is in your drink. In the [place] one drink equals one 12 oz bottle of beer (355 mL), one 5 oz glass of wine (148 mL), or one 1? oz glass of hard liquor (44 mL). ?Lifestyle ? ?Work with your doctor to stay at a healthy weight or to lose weight. Ask your doctor what the best weight is for you. ?Get at least 30 minutes of exercise that causes your heart to beat faster (aerobic exercise) most days of the week. This may include walking, swimming, or biking. ?Get at least 30 minutes of exercise that strengthens your muscles (resistance exercise) at least 3 days a week. This may include lifting weights or doing Pilates. ?Do not smoke or use any products that contain nicotine or tobacco. If you need help quitting, ask your doctor. ?Check your blood pressure at home as told by your doctor. ?Keep all follow-up visits. ?Medicines ?Take over-the-counter and prescription medicines   only as told by your doctor. Follow directions carefully. ?Do not skip doses of blood pressure medicine. The medicine does not work as well if you skip doses. Skipping doses also puts you at risk for problems. ?Ask your doctor about side effects or reactions to medicines that you should watch  for. ?Contact a doctor if: ?You think you are having a reaction to the medicine you are taking. ?You have headaches that keep coming back. ?You feel dizzy. ?You have swelling in your ankles. ?You have trouble with your vision. ?Get help right away if: ?You get a very bad headache. ?You start to feel mixed up (confused). ?You feel weak or numb. ?You feel faint. ?You have very bad pain in your: ?Chest. ?Belly (abdomen). ?You vomit more than once. ?You have trouble breathing. ?These symptoms may be an emergency. Get help right away. Call 911. ?Do not wait to see if the symptoms will go away. ?Do not drive yourself to the hospital. ?Summary ?Hypertension is another name for high blood pressure. ?High blood pressure forces your heart to work harder to pump blood. ?For most people, a normal blood pressure is less than 120/80. ?Making healthy choices can help lower blood pressure. If your blood pressure does not get lower with healthy choices, you may need to take medicine. ?This information is not intended to replace advice given to you by your health care provider. Make sure you discuss any questions you have with your health care provider. ?Document Revised: 05/21/2021 Document Reviewed: 05/21/2021 ?Elsevier Patient Education ? 2023 [employer] ? ?

## 2022-06-24 NOTE — Progress Notes (Signed)
Kathryn Gallegos,acting as a Education administrator for Minette Brine, FNP.,have documented all relevant documentation on the behalf of Minette Brine, FNP,as directed by  Minette Brine, FNP while in the presence of Minette Brine, [name].    Subjective:     Patient ID: Kathryn Gallegos , female    DOB: May 02, 1956 , 66 y.o.   MRN: [id number]   No chief complaint on file.   HPI  Patient presents for a bp and pre dm check. Patient states compliance with medications and has no other issues.   Wt Readings from Last 3 Encounters: 06/24/22 : 171 lb 12.8 oz (77.9 kg) 05/20/22 : 175 lb (79.4 kg) 04/30/22 : 175 lb (79.4 kg)       Past Medical History:  Diagnosis Date   Anxiety    Anxiety and depression    Asthma    Borderline diabetes    Carpal tunnel syndrome 07/05/2007   Qualifier: Diagnosis of  By: [name], Stanley     Dyspnea on exertion 09/22/2018   HIGH BLOOD PRESSURE 07/04/2007   Qualifier: Diagnosis of  By: [name], [name]     HTN (hypertension)    Hyperglycemia    Hyperlipidemia    IMPINGEMENT SYNDROME 03/09/2010   Qualifier: Diagnosis of  By: [name], [name]     Obesity    PSVT (paroxysmal supraventricular tachycardia) 09/22/2018   Pure hypercholesterolemia 09/22/2018   Rapid palpitations    Seasonal allergies      Family History  Problem Relation Age of Onset   Pancreatic cancer Mother    Esophageal cancer Maternal Grandmother    Heart disease Maternal Grandfather    Heart disease Maternal Uncle    Colon cancer Maternal Aunt    Stomach cancer Maternal Aunt    Arthritis Other    Cancer Other    Diabetes Other    Breast cancer Neg Hx      Current Outpatient Medications:    acetaminophen (TYLENOL) 650 MG CR tablet, Take 1,300 mg by mouth every 8 (eight) hours as needed for pain., Disp: , Rfl:    Blood Glucose Monitoring Suppl (BLOOD GLUCOSE MONITOR SYSTEM) w/Device KIT, 1 each by Does not apply route daily., Disp: 1 kit, Rfl: 0   CALCIUM CITRATE PO, Take 1 capsule by  mouth daily., Disp: , Rfl:    celecoxib (CELEBREX) 200 MG capsule, Take 1 capsule (200 mg total) by mouth 2 (two) times daily., Disp: 60 capsule, Rfl: 2   Cholecalciferol (VITAMIN D3) 125 MCG (5000 UT) CAPS, Take 5,000 Units by mouth daily., Disp: , Rfl:    cyclobenzaprine (FLEXERIL) 5 MG tablet, Take 1 tablet (5 mg total) by mouth 2 (two) times daily., Disp: 60 tablet, Rfl: 1   diltiazem (CARDIZEM CD) 240 MG 24 hr capsule, TAKE 1 CAPSULE BY MOUTH EVERY DAY, Disp: 90 capsule, Rfl: 3   diphenhydrAMINE (BENADRYL) 25 MG tablet, Take 25 mg by mouth every 6 (six) hours as needed., Disp: , Rfl:    esomeprazole (NEXIUM) 20 MG capsule, Take 20 mg by mouth daily as needed (heartburn). , Disp: , Rfl:    fluticasone (FLONASE) 50 MCG/ACT nasal spray, Place 1 spray into both nostrils daily as needed for allergies or rhinitis., Disp: , Rfl:    gabapentin (NEURONTIN) 100 MG capsule, Take 1 capsule (100 mg total) by mouth 3 (three) times daily., Disp: 90 capsule, Rfl: 2   HYDROcodone-acetaminophen (NORCO/VICODIN) 5-325 MG tablet, Take 1 tablet by mouth every 6 (six) hours as needed for moderate  pain., Disp: 30 tablet, Rfl: 0   levocetirizine (XYZAL) 5 MG tablet, Take 5 mg by mouth daily. , Disp: , Rfl:    naproxen (NAPROSYN) 500 MG tablet, Take 500 mg by mouth 2 (two) times daily with a meal., Disp: , Rfl:    NON FORMULARY, Take 1 capsule by mouth daily. Aaron Edelman Superfood, Disp: , Rfl:    NON FORMULARY, Take 1 capsule by mouth See admin instructions. 7 Dry Cleanse, Disp: , Rfl:    NON FORMULARY, KETO multivitamin, Disp: , Rfl:    Probiotic Product (RA PROBIOTIC COMPLEX) CAPS, Take 1 capsule by mouth daily., Disp: , Rfl:    rosuvastatin (CRESTOR) 10 MG tablet, Take 1 tablet (10 mg total) by mouth daily., Disp: 90 tablet, Rfl: 3   Tetrahydrozoline HCl (VISINE OP), Place 1 drop into both eyes daily as needed (allergies). , Disp: , Rfl:    ZINC-VITAMIN C PO, Take 1 tablet by mouth daily., Disp: , Rfl:    sertraline  (ZOLOFT) 50 MG tablet, Take 1 tablet (50 mg total) by mouth daily., Disp: 90 tablet, Rfl: 1   Allergies  Allergen Reactions   Amoxicillin Other (See Comments)    Inflammation in pancreas.  Did it involve swelling of the face/tongue/throat, SOB, or low BP? No Did it involve sudden or severe rash/hives, skin peeling, or any reaction on the inside of your mouth or nose? No Did you need to seek medical attention at a hospital or doctor's office? Yes When did it last happen?      5-6 years ago If all above answers are "NO", may proceed with cephalosporin use.    Penicillins Other (See Comments)    "Inflames pancreas"     Review of Systems  Constitutional: Negative.   HENT: Negative.    Eyes: Negative.   Respiratory: Negative.    Cardiovascular: Negative.   Gastrointestinal: Negative.      Today's Vitals   06/24/22 1531  BP: 122/80  Pulse: 70  Temp: 98.3 F (36.8 C)  TempSrc: Oral  Weight: 171 lb 12.8 oz (77.9 kg)  Height: _0  (1.626 m)  PainSc: 0-No pain   Body mass index is 29.49 kg/m.   Objective:  Physical Exam Vitals reviewed.  Constitutional:      General: She is not in acute distress.    Appearance: Normal appearance. She is well-developed. She is obese.  HENT:     Head: Normocephalic and atraumatic.  Eyes:     Pupils: Pupils are equal, round, and reactive to light.  Cardiovascular:     Rate and Rhythm: Normal rate and regular rhythm.     Pulses: Normal pulses.     Heart sounds: Normal heart sounds. No murmur heard. Pulmonary:     Effort: Pulmonary effort is normal. No respiratory distress.     Breath sounds: Normal breath sounds. No wheezing.  Skin:    General: Skin is warm and dry.     Capillary Refill: Capillary refill takes less than 2 seconds.  Neurological:     General: No focal deficit present.     Mental Status: She is alert and oriented to person, place, and time.     Cranial Nerves: No cranial nerve deficit.  Psychiatric:        Mood and  Affect: Mood normal.        Behavior: Behavior normal.        Thought Content: Thought content normal.        Judgment: Judgment normal.  Assessment And Plan:     1. Essential hypertension Comments: Blood pressure is controlled, continue current medications - CMP14+EGFR  2. Mixed hyperlipidemia Comments: Stable, continue statin. Tolerating well  3. Other fatigue Comments: Will check for metabolic causes - Iron, TIBC and Ferritin Panel  4. Prediabetes Comments: No current medications, continue focusing on healthy diet low in sugar and starches. Exercise as tolerated with a goal of 150 mins a week - Hemoglobin A1c  5. Elevated alkaline phosphatase level - VITAMIN D 25 Hydroxy (Vit-D Deficiency, Fractures) - CMP14+EGFR  6. Need for influenza vaccination Influenza vaccine administered Encouraged to take Tylenol as needed for fever or muscle aches. - Flu Vaccine QUAD High Dose(Fluad)  7. Need for Tdap vaccination Will give tetanus vaccine today while in office. Refer to order management. TDAP will be administered to adults 27-71 years old every 10 years. - Tdap vaccine greater than or equal to 7yo IM  8. Encounter for gynecological examination Comments: She would like to go to GYN for her women care - Ambulatory referral to Obstetrics / Gynecology     Patient was given opportunity to ask questions. Patient verbalized understanding of the plan and was able to repeat key elements of the plan. All questions were answered to their satisfaction.  Minette Brine, FNP   I, Minette Brine, FNP, have reviewed all documentation for this visit. The documentation on 06/24/22 for the exam, diagnosis, procedures, and orders are all accurate and complete.   IF YOU HAVE BEEN REFERRED TO A SPECIALIST, IT MAY TAKE 1-2 WEEKS TO SCHEDULE/PROCESS THE REFERRAL. IF YOU HAVE NOT HEARD FROM US/SPECIALIST IN TWO WEEKS, PLEASE GIVE [place] A CALL AT [phone].   THE PATIENT IS ENCOURAGED TO  PRACTICE SOCIAL DISTANCING DUE TO THE COVID-19 PANDEMIC.

## 2022-06-25 LAB — CMP14+EGFR
ALT: 46 IU/L — ABNORMAL HIGH (ref 0–32)
AST: 27 IU/L (ref 0–40)
Albumin/Globulin Ratio: 1.5 (ref 1.2–2.2)
Albumin: 4.3 g/dL (ref 3.9–4.9)
Alkaline Phosphatase: 99 IU/L (ref 44–121)
BUN/Creatinine Ratio: 11 — ABNORMAL LOW (ref 12–28)
BUN: 11 mg/dL (ref 8–27)
Bilirubin Total: 0.2 mg/dL (ref 0.0–1.2)
CO2: 24 mmol/L (ref 20–29)
Calcium: 9.8 mg/dL (ref 8.7–10.3)
Chloride: 103 mmol/L (ref 96–106)
Creatinine, Ser: 0.96 mg/dL (ref 0.57–1.00)
Globulin, Total: 2.8 g/dL (ref 1.5–4.5)
Glucose: 114 mg/dL — ABNORMAL HIGH (ref 70–99)
Potassium: 4.1 mmol/L (ref 3.5–5.2)
Sodium: 141 mmol/L (ref 134–144)
Total Protein: 7.1 g/dL (ref 6.0–8.5)
eGFR: 66 mL/min/{1.73_m2} (ref >59)

## 2022-06-25 LAB — IRON,TIBC AND FERRITIN PANEL
Ferritin: 479 ng/mL — ABNORMAL HIGH (ref 15–150)
Iron Saturation: 25 % (ref 15–55)
Iron: 66 ug/dL (ref 27–139)
Total Iron Binding Capacity: 264 ug/dL (ref 250–450)
UIBC: 198 ug/dL (ref 118–369)

## 2022-06-25 LAB — HEMOGLOBIN A1C
Est. average glucose Bld gHb Est-mCnc: 123 mg/dL
Hgb A1c MFr Bld: 5.9 % — ABNORMAL HIGH (ref 4.8–5.6)

## 2022-06-25 LAB — VITAMIN D 25 HYDROXY (VIT D DEFICIENCY, FRACTURES): Vit D, 25-Hydroxy: 28.9 ng/mL — ABNORMAL LOW (ref 30.0–100.0)

## 2022-07-07 ENCOUNTER — Encounter: Payer: Self-pay | Admitting: Nurse Practitioner

## 2022-07-07 DIAGNOSIS — Z6830 Body mass index (BMI) 30.0-30.9, adult: Secondary | ICD-10-CM | POA: Diagnosis not present

## 2022-07-07 DIAGNOSIS — M461 Sacroiliitis, not elsewhere classified: Secondary | ICD-10-CM | POA: Diagnosis not present

## 2022-07-07 DIAGNOSIS — M47812 Spondylosis without myelopathy or radiculopathy, cervical region: Secondary | ICD-10-CM | POA: Diagnosis not present

## 2022-08-02 DIAGNOSIS — R6889 Other general symptoms and signs: Secondary | ICD-10-CM | POA: Diagnosis not present

## 2022-08-02 DIAGNOSIS — M47812 Spondylosis without myelopathy or radiculopathy, cervical region: Secondary | ICD-10-CM | POA: Diagnosis not present

## 2022-08-11 ENCOUNTER — Other Ambulatory Visit: Payer: Self-pay | Admitting: Orthopedic Surgery

## 2022-08-11 DIAGNOSIS — G8929 Other chronic pain: Secondary | ICD-10-CM

## 2022-09-01 DIAGNOSIS — Z6829 Body mass index (BMI) 29.0-29.9, adult: Secondary | ICD-10-CM | POA: Diagnosis not present

## 2022-09-01 DIAGNOSIS — Z1211 Encounter for screening for malignant neoplasm of colon: Secondary | ICD-10-CM | POA: Diagnosis not present

## 2022-09-01 DIAGNOSIS — N952 Postmenopausal atrophic vaginitis: Secondary | ICD-10-CM | POA: Diagnosis not present

## 2022-09-01 DIAGNOSIS — Z1239 Encounter for other screening for malignant neoplasm of breast: Secondary | ICD-10-CM | POA: Diagnosis not present

## 2022-09-01 DIAGNOSIS — Z90711 Acquired absence of uterus with remaining cervical stump: Secondary | ICD-10-CM | POA: Diagnosis not present

## 2022-09-01 DIAGNOSIS — Z01419 Encounter for gynecological examination (general) (routine) without abnormal findings: Secondary | ICD-10-CM | POA: Diagnosis not present

## 2022-09-01 DIAGNOSIS — Z13820 Encounter for screening for osteoporosis: Secondary | ICD-10-CM | POA: Diagnosis not present

## 2022-09-21 ENCOUNTER — Other Ambulatory Visit: Payer: Self-pay | Admitting: Obstetrics and Gynecology

## 2022-09-21 DIAGNOSIS — R6889 Other general symptoms and signs: Secondary | ICD-10-CM | POA: Diagnosis not present

## 2022-09-21 DIAGNOSIS — Z1231 Encounter for screening mammogram for malignant neoplasm of breast: Secondary | ICD-10-CM

## 2022-09-21 DIAGNOSIS — M47812 Spondylosis without myelopathy or radiculopathy, cervical region: Secondary | ICD-10-CM | POA: Diagnosis not present

## 2022-09-23 ENCOUNTER — Ambulatory Visit
Admission: RE | Admit: 2022-09-23 | Discharge: 2022-09-23 | Disposition: A | Discharge status: Home / Self Care | Payer: Medicare HMO | Source: Ambulatory Visit | Attending: Nurse Practitioner | Admitting: Nurse Practitioner

## 2022-09-23 DIAGNOSIS — E2839 Other primary ovarian failure: Secondary | ICD-10-CM

## 2022-09-23 DIAGNOSIS — Z780 Asymptomatic menopausal state: Secondary | ICD-10-CM | POA: Diagnosis not present

## 2022-09-23 DIAGNOSIS — M85852 Other specified disorders of bone density and structure, left thigh: Secondary | ICD-10-CM | POA: Diagnosis not present

## 2022-09-23 DIAGNOSIS — M810 Age-related osteoporosis without current pathological fracture: Secondary | ICD-10-CM | POA: Diagnosis not present

## 2022-10-01 DIAGNOSIS — J209 Acute bronchitis, unspecified: Secondary | ICD-10-CM | POA: Diagnosis not present

## 2022-10-13 ENCOUNTER — Encounter: Payer: Medicare HMO | Admitting: Nurse Practitioner

## 2022-10-14 ENCOUNTER — Encounter: Payer: Self-pay | Admitting: Radiology

## 2022-10-21 DIAGNOSIS — R6889 Other general symptoms and signs: Secondary | ICD-10-CM | POA: Diagnosis not present

## 2022-10-21 DIAGNOSIS — M461 Sacroiliitis, not elsewhere classified: Secondary | ICD-10-CM | POA: Diagnosis not present

## 2022-10-25 ENCOUNTER — Telehealth: Payer: Self-pay | Admitting: Nurse Practitioner

## 2022-10-25 NOTE — Telephone Encounter (Signed)
Called pt to schedule WTM no answer left VM

## 2022-10-26 DIAGNOSIS — H5203 Hypermetropia, bilateral: Secondary | ICD-10-CM | POA: Diagnosis not present

## 2022-10-29 DIAGNOSIS — H524 Presbyopia: Secondary | ICD-10-CM | POA: Diagnosis not present

## 2022-11-16 DIAGNOSIS — M47812 Spondylosis without myelopathy or radiculopathy, cervical region: Secondary | ICD-10-CM | POA: Diagnosis not present

## 2022-11-16 DIAGNOSIS — R6889 Other general symptoms and signs: Secondary | ICD-10-CM | POA: Diagnosis not present

## 2022-11-30 DIAGNOSIS — M47812 Spondylosis without myelopathy or radiculopathy, cervical region: Secondary | ICD-10-CM | POA: Diagnosis not present

## 2022-11-30 DIAGNOSIS — R6889 Other general symptoms and signs: Secondary | ICD-10-CM | POA: Diagnosis not present

## 2022-12-11 ENCOUNTER — Other Ambulatory Visit: Payer: Self-pay | Admitting: Cardiology

## 2022-12-11 DIAGNOSIS — I471 Supraventricular tachycardia, unspecified: Secondary | ICD-10-CM

## 2022-12-11 DIAGNOSIS — I10 Essential (primary) hypertension: Secondary | ICD-10-CM

## 2022-12-22 DIAGNOSIS — R6889 Other general symptoms and signs: Secondary | ICD-10-CM | POA: Diagnosis not present

## 2022-12-22 DIAGNOSIS — M25511 Pain in right shoulder: Secondary | ICD-10-CM | POA: Diagnosis not present

## 2022-12-22 DIAGNOSIS — G8929 Other chronic pain: Secondary | ICD-10-CM | POA: Diagnosis not present

## 2023-02-23 ENCOUNTER — Ambulatory Visit: Payer: Medicare HMO | Admitting: Cardiology

## 2023-03-07 ENCOUNTER — Ambulatory Visit: Admission: RE | Admit: 2023-03-07 | Payer: Medicare HMO | Source: Ambulatory Visit

## 2023-03-07 DIAGNOSIS — Z1231 Encounter for screening mammogram for malignant neoplasm of breast: Secondary | ICD-10-CM | POA: Diagnosis not present

## 2023-03-08 ENCOUNTER — Ambulatory Visit: Payer: Medicare HMO | Admitting: Cardiology

## 2023-03-10 ENCOUNTER — Encounter: Payer: Self-pay | Admitting: Nurse Practitioner

## 2023-03-10 ENCOUNTER — Ambulatory Visit: Payer: Medicare HMO | Admitting: Nurse Practitioner

## 2023-03-10 VITALS — BP 120/70 | HR 84 | Temp 98.9°F | Ht 64.0 in | Wt 174.6 lb

## 2023-03-10 DIAGNOSIS — U071 COVID-19: Secondary | ICD-10-CM | POA: Diagnosis not present

## 2023-03-10 LAB — POC SOFIA 2 FLU + SARS ANTIGEN FIA
Influenza A, POC: NEGATIVE
Influenza B, POC: NEGATIVE
SARS Coronavirus 2 Ag: POSITIVE — AB

## 2023-03-10 MED ORDER — NIRMATRELVIR/RITONAVIR (PAXLOVID) TABLET (RENAL DOSING)
2.0000 | ORAL_TABLET | Freq: Two times a day (BID) | ORAL | 0 refills | Status: AC
Start: 2023-03-10 — End: 2023-03-15

## 2023-03-10 MED ORDER — BENZONATATE 100 MG PO CAPS
100.0000 mg | ORAL_CAPSULE | Freq: Four times a day (QID) | ORAL | 1 refills | Status: AC | PRN
Start: 2023-03-10 — End: 2024-03-09

## 2023-03-10 NOTE — Patient Instructions (Signed)
Hold your rosuvastatin for total of 10 days while taking Paxlovid Advised patient to take Vitamin C, D, Zinc.  Keep yourself hydrated with a lot of water and rest. Take Delsym for cough and Mucinex as need. Take Tylenol or pain reliever every 4-6 hours as needed for pain/fever/body ache. If you have elevated blood pressure, you can take OTC Corcidin. You can also take OTC oscillococcinum to help with your symptoms.  Educated patient if symptoms get worse or if she experiences any SOB, chest pain or pain in her legs to seek immediate emergency care. Continue to monitor your oxygen levels. Call us if you have any questions. Quarantine for 5 days and Wear a mask around other people for 5 more days.

## 2023-03-10 NOTE — Progress Notes (Deleted)
Madelaine Bhat, CMA,acting as a Neurosurgeon for Arnette Felts, FNP.,have documented all relevant documentation on the behalf of Arnette Felts, FNP,as directed by  Arnette Felts, FNP while in the presence of Arnette Felts, FNP.  Subjective:    Patient ID: Kathryn Gallegos , female    DOB: 1956-05-26 , 67 y.o.   MRN: [id number]  No chief complaint on file.   HPI  Patient presents today for a welcome to medicare visit, patient reports compliance with medications. Patient denies any chest pain, SOB, or headaches. Patient has no other concerns today.     Past Medical History:  Diagnosis Date  . Anxiety   . Anxiety and depression   . Asthma   . Borderline diabetes   . Carpal tunnel syndrome 07/05/2007   Qualifier: Diagnosis of  By: Romeo Apple MD, Duffy Rhody    . Dyspnea on exertion 09/22/2018  . HIGH BLOOD PRESSURE 07/04/2007   Qualifier: Diagnosis of  By: Romeo Apple MD, Duffy Rhody    . HTN (hypertension)   . Hyperglycemia   . Hyperlipidemia   . IMPINGEMENT SYNDROME 03/09/2010   Qualifier: Diagnosis of  By: Romeo Apple MD, Duffy Rhody    . Obesity   . PSVT (paroxysmal supraventricular tachycardia) 09/22/2018  . Pure hypercholesterolemia 09/22/2018  . Rapid palpitations   . Seasonal allergies      Family History  Problem Relation Age of Onset  . Pancreatic cancer Mother   . Esophageal cancer Maternal Grandmother   . Heart disease Maternal Grandfather   . Heart disease Maternal Uncle   . Colon cancer Maternal Aunt   . Stomach cancer Maternal Aunt   . Arthritis Other   . Cancer Other   . Diabetes Other   . Breast cancer Neg Hx      Current Outpatient Medications:  .  acetaminophen (TYLENOL) 650 MG CR tablet, Take 1,300 mg by mouth every 8 (eight) hours as needed for pain., Disp: , Rfl:  .  Blood Glucose Monitoring Suppl (BLOOD GLUCOSE MONITOR SYSTEM) w/Device KIT, 1 each by Does not apply route daily., Disp: 1 kit, Rfl: 0 .  CALCIUM CITRATE PO, Take 1 capsule by mouth daily., Disp: , Rfl:  .   celecoxib (CELEBREX) 200 MG capsule, Take 1 capsule (200 mg total) by mouth 2 (two) times daily., Disp: 60 capsule, Rfl: 2 .  Cholecalciferol (VITAMIN D3) 125 MCG (5000 UT) CAPS, Take 5,000 Units by mouth daily., Disp: , Rfl:  .  cyclobenzaprine (FLEXERIL) 5 MG tablet, Take 1 tablet (5 mg total) by mouth 2 (two) times daily., Disp: 60 tablet, Rfl: 1 .  diltiazem (CARDIZEM CD) 240 MG 24 hr capsule, TAKE 1 CAPSULE BY MOUTH EVERY DAY, Disp: 90 capsule, Rfl: 3 .  diphenhydrAMINE (BENADRYL) 25 MG tablet, Take 25 mg by mouth every 6 (six) hours as needed., Disp: , Rfl:  .  esomeprazole (NEXIUM) 20 MG capsule, Take 20 mg by mouth daily as needed (heartburn). , Disp: , Rfl:  .  fluticasone (FLONASE) 50 MCG/ACT nasal spray, Place 1 spray into both nostrils daily as needed for allergies or rhinitis., Disp: , Rfl:  .  gabapentin (NEURONTIN) 100 MG capsule, Take 1 capsule (100 mg total) by mouth 3 (three) times daily., Disp: 90 capsule, Rfl: 2 .  HYDROcodone-acetaminophen (NORCO/VICODIN) 5-325 MG tablet, Take 1 tablet by mouth every 6 (six) hours as needed for moderate pain., Disp: 30 tablet, Rfl: 0 .  levocetirizine (XYZAL) 5 MG tablet, Take 5 mg by mouth daily. , Disp: , Rfl:  .  naproxen (NAPROSYN) 500 MG tablet, Take 500 mg by mouth 2 (two) times daily with a meal., Disp: , Rfl:  .  NON FORMULARY, Take 1 capsule by mouth daily. Arlys John Superfood, Disp: , Rfl:  .  NON FORMULARY, Take 1 capsule by mouth See admin instructions. 7 Dry Cleanse, Disp: , Rfl:  .  NON FORMULARY, KETO multivitamin, Disp: , Rfl:  .  Probiotic Product (RA PROBIOTIC COMPLEX) CAPS, Take 1 capsule by mouth daily., Disp: , Rfl:  .  rosuvastatin (CRESTOR) 10 MG tablet, Take 1 tablet (10 mg total) by mouth daily., Disp: 90 tablet, Rfl: 3 .  sertraline (ZOLOFT) 50 MG tablet, Take 1 tablet (50 mg total) by mouth daily., Disp: 90 tablet, Rfl: 1 .  Tetrahydrozoline HCl (VISINE OP), Place 1 drop into both eyes daily as needed (allergies). , Disp:  , Rfl:  .  ZINC-VITAMIN C PO, Take 1 tablet by mouth daily., Disp: , Rfl:    Allergies  Allergen Reactions  . Amoxicillin Other (See Comments)    Inflammation in pancreas.  Did it involve swelling of the face/tongue/throat, SOB, or low BP? No Did it involve sudden or severe rash/hives, skin peeling, or any reaction on the inside of your mouth or nose? No Did you need to seek medical attention at a hospital or doctor's office? Yes When did it last happen?      5-6 years ago If all above answers are "NO", may proceed with cephalosporin use.   Marland Kitchen Penicillins Other (See Comments)    "Inflames pancreas"      The patient states she uses {contraceptive methods:5051} for birth control. No LMP recorded. Patient has had a hysterectomy.. {Dysmenorrhea-menorrhagia:21918}. Negative for: breast discharge, breast lump(s), breast pain and breast self exam. Associated symptoms include abnormal vaginal bleeding. Pertinent negatives include abnormal bleeding (hematology), anxiety, decreased libido, depression, difficulty falling sleep, dyspareunia, history of infertility, nocturia, sexual dysfunction, sleep disturbances, urinary incontinence, urinary urgency, vaginal discharge and vaginal itching. Diet regular.The patient states her exercise level is    . The patient's tobacco use is:  Social History   Tobacco Use  Smoking Status Former  . Current packs/day: 0.00  . Average packs/day: 0.3 packs/day for 2.0 years (0.5 ttl pk-yrs)  . Types: Cigarettes  . Start date: 79  . Quit date: 73  . Years since quitting: 54.6  Smokeless Tobacco Never  . She has been exposed to passive smoke. The patient's alcohol use is:  Social History   Substance and Sexual Activity  Alcohol Use No  . Additional information: Last pap ***, next one scheduled for ***.    Review of Systems   There were no vitals filed for this visit. There is no height or weight on file to calculate BMI.  Wt Readings from Last 3  Encounters:  06/24/22 171 lb 12.8 oz (77.9 kg)  05/20/22 175 lb (79.4 kg)  04/30/22 175 lb (79.4 kg)     Objective:  Physical Exam      Assessment And Plan:     Medicare welcome visit     No follow-ups on file. Patient was given opportunity to ask questions. Patient verbalized understanding of the plan and was able to repeat key elements of the plan. All questions were answered to their satisfaction.   Arnette Felts, FNP  I, Arnette Felts, FNP, have reviewed all documentation for this visit. The documentation on 03/10/23 for the exam, diagnosis, procedures, and orders are all accurate and complete.

## 2023-03-10 NOTE — Progress Notes (Signed)
Madelaine Bhat, CMA,acting as a Neurosurgeon for Arnette Felts, FNP.,have documented all relevant documentation on the behalf of Arnette Felts, FNP,as directed by  Arnette Felts, FNP while in the presence of Arnette Felts, FNP.  Subjective:  Patient ID: Kathryn Gallegos , female    DOB: 1956/07/24 , 67 y.o.   MRN: [id number]  Chief Complaint  Patient presents with   Covid Positive    HPI  Patient presents today for WTM which was cancelled due to patient being sick. Patient reports she was at a family reunion Saturday. Patient reports she started having cough, congestion, no appetite, and reports her whole body feels hot. Denies shortness of breath or chest pain. She is feeling better everyday that goes by. Patients symptoms started Monday.    BP Readings from Last 3 Encounters: 03/10/23 : 120/70 06/24/22 : 122/80 05/20/22 : 138/70       Past Medical History:  Diagnosis Date   Anxiety    Anxiety and depression    Asthma    Borderline diabetes    Carpal tunnel syndrome 07/05/2007   Qualifier: Diagnosis of  By: Romeo Apple MD, Stanley     Dyspnea on exertion 09/22/2018   HIGH BLOOD PRESSURE 07/04/2007   Qualifier: Diagnosis of  By: Romeo Apple MD, Duffy Rhody     HTN (hypertension)    Hyperglycemia    Hyperlipidemia    IMPINGEMENT SYNDROME 03/09/2010   Qualifier: Diagnosis of  By: Romeo Apple MD, Duffy Rhody     Obesity    PSVT (paroxysmal supraventricular tachycardia) 09/22/2018   Pure hypercholesterolemia 09/22/2018   Rapid palpitations    Seasonal allergies      Family History  Problem Relation Age of Onset   Pancreatic cancer Mother    Esophageal cancer Maternal Grandmother    Heart disease Maternal Grandfather    Heart disease Maternal Uncle    Colon cancer Maternal Aunt    Stomach cancer Maternal Aunt    Arthritis Other    Cancer Other    Diabetes Other    Breast cancer Neg Hx      Current Outpatient Medications:    benzonatate (TESSALON PERLES) 100 MG capsule, Take 1 capsule (100 mg  total) by mouth every 6 (six) hours as needed., Disp: 30 capsule, Rfl: 1   acetaminophen (TYLENOL) 650 MG CR tablet, Take 1,300 mg by mouth every 8 (eight) hours as needed for pain., Disp: , Rfl:    Blood Glucose Monitoring Suppl (BLOOD GLUCOSE MONITOR SYSTEM) w/Device KIT, 1 each by Does not apply route daily., Disp: 1 kit, Rfl: 0   CALCIUM CITRATE PO, Take 1 capsule by mouth daily., Disp: , Rfl:    celecoxib (CELEBREX) 200 MG capsule, Take 1 capsule (200 mg total) by mouth 2 (two) times daily., Disp: 60 capsule, Rfl: 2   Cholecalciferol (VITAMIN D3) 125 MCG (5000 UT) CAPS, Take 5,000 Units by mouth daily., Disp: , Rfl:    cyclobenzaprine (FLEXERIL) 5 MG tablet, Take 1 tablet (5 mg total) by mouth 2 (two) times daily., Disp: 60 tablet, Rfl: 1   diltiazem (CARDIZEM CD) 240 MG 24 hr capsule, TAKE 1 CAPSULE BY MOUTH EVERY DAY, Disp: 90 capsule, Rfl: 3   diphenhydrAMINE (BENADRYL) 25 MG tablet, Take 25 mg by mouth every 6 (six) hours as needed., Disp: , Rfl:    esomeprazole (NEXIUM) 20 MG capsule, Take 20 mg by mouth daily as needed (heartburn). , Disp: , Rfl:    fluticasone (FLONASE) 50 MCG/ACT nasal spray, Place 1 spray into both  nostrils daily as needed for allergies or rhinitis., Disp: , Rfl:    gabapentin (NEURONTIN) 100 MG capsule, Take 1 capsule (100 mg total) by mouth 3 (three) times daily., Disp: 90 capsule, Rfl: 2   HYDROcodone-acetaminophen (NORCO/VICODIN) 5-325 MG tablet, Take 1 tablet by mouth every 6 (six) hours as needed for moderate pain., Disp: 30 tablet, Rfl: 0   levocetirizine (XYZAL) 5 MG tablet, Take 5 mg by mouth daily. , Disp: , Rfl:    naproxen (NAPROSYN) 500 MG tablet, Take 500 mg by mouth 2 (two) times daily with a meal., Disp: , Rfl:    NON FORMULARY, Take 1 capsule by mouth daily. Arlys John Superfood, Disp: , Rfl:    NON FORMULARY, Take 1 capsule by mouth See admin instructions. 7 Dry Cleanse, Disp: , Rfl:    NON FORMULARY, KETO multivitamin, Disp: , Rfl:    Probiotic Product  (RA PROBIOTIC COMPLEX) CAPS, Take 1 capsule by mouth daily., Disp: , Rfl:    rosuvastatin (CRESTOR) 10 MG tablet, Take 1 tablet (10 mg total) by mouth daily., Disp: 90 tablet, Rfl: 3   sertraline (ZOLOFT) 50 MG tablet, Take 1 tablet (50 mg total) by mouth daily., Disp: 90 tablet, Rfl: 1   Tetrahydrozoline HCl (VISINE OP), Place 1 drop into both eyes daily as needed (allergies). , Disp: , Rfl:    ZINC-VITAMIN C PO, Take 1 tablet by mouth daily., Disp: , Rfl:    Allergies  Allergen Reactions   Amoxicillin Other (See Comments)    Inflammation in pancreas.  Did it involve swelling of the face/tongue/throat, SOB, or low BP? No Did it involve sudden or severe rash/hives, skin peeling, or any reaction on the inside of your mouth or nose? No Did you need to seek medical attention at a hospital or doctor's office? Yes When did it last happen?      5-6 years ago If all above answers are "NO", may proceed with cephalosporin use.    Penicillins Other (See Comments)    "Inflames pancreas"     Review of Systems  Constitutional: Negative.   HENT: Negative.    Eyes: Negative.   Respiratory: Negative.    Cardiovascular: Negative.   Gastrointestinal: Negative.   Psychiatric/Behavioral: Negative.       Today's Vitals   03/10/23 1508  BP: 120/70  Pulse: 84  Temp: 98.9 F (37.2 C)  TempSrc: Oral  Weight: 174 lb 9.6 oz (79.2 kg)  Height: 5\' 4"  (1.626 m)  PainSc: 0-No pain   Body mass index is 29.97 kg/m.  Wt Readings from Last 3 Encounters:  03/10/23 174 lb 9.6 oz (79.2 kg)  06/24/22 171 lb 12.8 oz (77.9 kg)  05/20/22 175 lb (79.4 kg)      Objective:  Physical Exam Vitals reviewed.  Constitutional:      General: She is not in acute distress.    Appearance: Normal appearance.  Cardiovascular:     Rate and Rhythm: Normal rate and regular rhythm.     Pulses: Normal pulses.     Heart sounds: Normal heart sounds. No murmur heard. Pulmonary:     Effort: Pulmonary effort is normal. No  respiratory distress.     Breath sounds: Normal breath sounds. No wheezing.  Skin:    General: Skin is warm.     Capillary Refill: Capillary refill takes less than 2 seconds.  Neurological:     General: No focal deficit present.     Mental Status: She is alert and oriented to person,  place, and time.     Cranial Nerves: No cranial nerve deficit.     Motor: No weakness.         Assessment And Plan:  COVID-19 Assessment & Plan: Advised patient to take Vitamin C, D, Zinc.  Keep yourself hydrated with a lot of water and rest. Take Delsym for cough and Mucinex as need. Take Tylenol or pain reliever every 4-6 hours as needed for pain/fever/body ache. If you have elevated blood pressure, you can take OTC Corcidin. You can also take OTC oscillococcinum to help with your symptoms.  Educated patient if symptoms get worse or if she experiences any SOB, chest pain or pain in her legs to seek immediate emergency care. Continue to monitor your oxygen levels. Call us if you have any questions. Quarantine for 5 days, will need to quarantine 10 days if continues to have symptoms and continue to wear mask for total 10 days when around others.    Orders: -     POC SOFIA 2 FLU + SARS ANTIGEN FIA -     nirmatrelvir/ritonavir (renal dosing); Take 2 tablets by mouth 2 (two) times daily for 5 days. Patient GFR is 66. Take nirmatrelvir (150 mg) one tablet twice daily for 5 days and ritonavir (100 mg) one tablet twice daily for 5 days.  Dispense: 20 tablet; Refill: 0 -     Benzonatate; Take 1 capsule (100 mg total) by mouth every 6 (six) hours as needed.  Dispense: 30 capsule; Refill: 1    Return in 4 months (on 07/11/2023) for welcome to medicare..  Patient was given opportunity to ask questions. Patient verbalized understanding of the plan and was able to repeat key elements of the plan. All questions were answered to their satisfaction.   Jeanell Sparrow, FNP, have reviewed all documentation for this visit.  The documentation on 03/10/23 for the exam, diagnosis, procedures, and orders are all accurate and complete.   IF YOU HAVE BEEN REFERRED TO A SPECIALIST, IT MAY TAKE 1-2 WEEKS TO SCHEDULE/PROCESS THE REFERRAL. IF YOU HAVE NOT HEARD FROM US/SPECIALIST IN TWO WEEKS, PLEASE GIVE [place] A CALL AT [phone].

## 2023-03-20 DIAGNOSIS — U071 COVID-19: Secondary | ICD-10-CM | POA: Insufficient documentation

## 2023-03-20 NOTE — Assessment & Plan Note (Signed)
Advised patient to take Vitamin C, D, Zinc.  Keep yourself hydrated with a lot of water and rest. Take Delsym for cough and Mucinex as need. Take Tylenol or pain reliever every 4-6 hours as needed for pain/fever/body ache. If you have elevated blood pressure, you can take OTC Corcidin. You can also take OTC oscillococcinum to help with your symptoms.  Educated patient if symptoms get worse or if she experiences any SOB, chest pain or pain in her legs to seek immediate emergency care. Continue to monitor your oxygen levels. Call us if you have any questions. Quarantine for 5 days, will need to quarantine 10 days if continues to have symptoms and continue to wear mask for total 10 days when around others.

## 2023-04-12 ENCOUNTER — Encounter: Payer: Medicare HMO | Admitting: Nurse Practitioner

## 2023-04-14 ENCOUNTER — Other Ambulatory Visit: Payer: Self-pay

## 2023-04-14 MED ORDER — ROSUVASTATIN CALCIUM 10 MG PO TABS: 10.0000 mg | ORAL_TABLET | Freq: Every day | ORAL | 3 refills | Status: DC

## 2023-04-21 ENCOUNTER — Ambulatory Visit: Payer: Medicare HMO | Admitting: Cardiology

## 2023-04-25 ENCOUNTER — Encounter: Payer: Medicare HMO | Admitting: Nurse Practitioner

## 2023-04-27 ENCOUNTER — Ambulatory Visit (INDEPENDENT_AMBULATORY_CARE_PROVIDER_SITE_OTHER): Payer: Medicare HMO

## 2023-04-27 DIAGNOSIS — Z0000 Encounter for general adult medical examination without abnormal findings: Secondary | ICD-10-CM | POA: Diagnosis not present

## 2023-04-27 NOTE — Progress Notes (Signed)
Subjective:   Kathryn Gallegos is a 67 y.o. female who presents for an Initial Medicare Annual Wellness Visit.  Visit Complete: Virtual  I connected with  LADAIJA Gallegos on 04/27/23 by a audio enabled telemedicine application and verified that I am speaking with the correct person using two identifiers.  Patient Location: Home  Provider Location: Office/Clinic  I discussed the limitations of evaluation and management by telemedicine. The patient expressed understanding and agreed to proceed.  Vital Signs: Unable to obtain new vitals due to this being a telehealth visit.  Review of Systems     Cardiac Risk Factors include: advanced age (>56men, >68 women);dyslipidemia;hypertension     Objective:    Today's Vitals   04/27/23 1557  PainSc: 8    There is no height or weight on file to calculate BMI.     04/27/2023    4:13 PM 01/09/2019    6:38 AM 01/05/2019    2:30 PM 07/19/2017    3:44 PM 06/17/2017    2:18 PM  Advanced Directives  Does Patient Have a Medical Advance Directive? No No No No No  Would patient like information on creating a medical advance directive?  No - Patient declined No - Patient declined No - Patient declined No - Patient declined    Current Medications (verified) Outpatient Encounter Medications as of 04/27/2023  Medication Sig   benzonatate (TESSALON PERLES) 100 MG capsule Take 1 capsule (100 mg total) by mouth every 6 (six) hours as needed.   CALCIUM CITRATE PO Take 1 capsule by mouth daily.   celecoxib (CELEBREX) 200 MG capsule Take 1 capsule (200 mg total) by mouth 2 (two) times daily.   cyclobenzaprine (FLEXERIL) 5 MG tablet Take 1 tablet (5 mg total) by mouth 2 (two) times daily.   diltiazem (CARDIZEM CD) 240 MG 24 hr capsule TAKE 1 CAPSULE BY MOUTH EVERY DAY   diphenhydrAMINE (BENADRYL) 25 MG tablet Take 25 mg by mouth every 6 (six) hours as needed.   esomeprazole (NEXIUM) 20 MG capsule Take 20 mg by mouth daily as needed (heartburn).     fluticasone (FLONASE) 50 MCG/ACT nasal spray Place 1 spray into both nostrils daily as needed for allergies or rhinitis.   gabapentin (NEURONTIN) 100 MG capsule Take 1 capsule (100 mg total) by mouth 3 (three) times daily.   ibuprofen (ADVIL) 800 MG tablet Take 800 mg by mouth every 8 (eight) hours as needed.   levocetirizine (XYZAL) 5 MG tablet Take 5 mg by mouth daily.    NON FORMULARY KETO multivitamin   Probiotic Product (RA PROBIOTIC COMPLEX) CAPS Take 1 capsule by mouth daily.   rosuvastatin (CRESTOR) 10 MG tablet Take 1 tablet (10 mg total) by mouth daily.   sertraline (ZOLOFT) 50 MG tablet Take 1 tablet (50 mg total) by mouth daily.   Tetrahydrozoline HCl (VISINE OP) Place 1 drop into both eyes daily as needed (allergies).    ZINC-VITAMIN C PO Take 1 tablet by mouth daily.   acetaminophen (TYLENOL) 650 MG CR tablet Take 1,300 mg by mouth every 8 (eight) hours as needed for pain. (Patient not taking: Reported on 04/27/2023)   Blood Glucose Monitoring Suppl (BLOOD GLUCOSE MONITOR SYSTEM) w/Device KIT 1 each by Does not apply route daily. (Patient not taking: Reported on 04/27/2023)   Cholecalciferol (VITAMIN D3) 125 MCG (5000 UT) CAPS Take 5,000 Units by mouth daily. (Patient not taking: Reported on 04/27/2023)   HYDROcodone-acetaminophen (NORCO/VICODIN) 5-325 MG tablet Take 1 tablet by mouth  every 6 (six) hours as needed for moderate pain. (Patient not taking: Reported on 04/27/2023)   naproxen (NAPROSYN) 500 MG tablet Take 500 mg by mouth 2 (two) times daily with a meal. (Patient not taking: Reported on 04/27/2023)   NON FORMULARY Take 1 capsule by mouth daily. Kathryn Gallegos (Patient not taking: Reported on 04/27/2023)   NON FORMULARY Take 1 capsule by mouth See admin instructions. 7 Dry Cleanse (Patient not taking: Reported on 04/27/2023)   No facility-administered encounter medications on file as of 04/27/2023.    Allergies (verified) Amoxicillin and Penicillins   History: Past Medical  History:  Diagnosis Date   Anxiety    Anxiety and depression    Asthma    Borderline diabetes    Carpal tunnel syndrome 07/05/2007   Qualifier: Diagnosis of  By: Romeo Apple MD, Stanley     Dyspnea on exertion 09/22/2018   HIGH BLOOD PRESSURE 07/04/2007   Qualifier: Diagnosis of  By: Romeo Apple MD, Stanley     HTN (hypertension)    Hyperglycemia    Hyperlipidemia    IMPINGEMENT SYNDROME 03/09/2010   Qualifier: Diagnosis of  By: Romeo Apple MD, Stanley     Obesity    PSVT (paroxysmal supraventricular tachycardia) 09/22/2018   Pure hypercholesterolemia 09/22/2018   Rapid palpitations    Seasonal allergies    Past Surgical History:  Procedure Laterality Date   BREAST BIOPSY Left    SHOULDER ARTHROSCOPY WITH BICEPSTENOTOMY Right 01/09/2019   Procedure: SHOULDER ARTHROSCOPY WITH biceps tenotomy, limited debriedment;  Surgeon: Vickki Hearing, MD;  Location: [facility];  Service: Orthopedics;  Laterality: Right;   TOTAL ABDOMINAL HYSTERECTOMY     TUBAL LIGATION     Family History  Problem Relation Age of Onset   Pancreatic cancer Mother    Esophageal cancer Maternal Grandmother    Heart disease Maternal Grandfather    Heart disease Maternal Uncle    Colon cancer Maternal Aunt    Stomach cancer Maternal Aunt    Arthritis Other    Cancer Other    Diabetes Other    Breast cancer Neg Hx    Social History   Socioeconomic History   Marital status: Legally Separated    Spouse name: Not on file   Number of children: 0   Years of education: 2 yr coll.   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    Employer: [employer]  Tobacco Use   Smoking status: Former    Current packs/day: 0.00    Average packs/day: 0.3 packs/day for 2.0 years (0.5 ttl pk-yrs)    Types: Cigarettes    Start date: 30    Quit date: 1970    Years since quitting: 54.7   Smokeless tobacco: Never  Vaping Use   Vaping status: Never Used  Substance and Sexual  Activity   Alcohol use: No   Drug use: No   Sexual activity: Yes    Birth control/protection: Surgical    Comment: HYST Erroll Luna  Other Topics Concern   Not on file  Social History Narrative   Not on file   Social Determinants of Health   Financial Resource Strain: Low Risk  (04/27/2023)   Overall Financial Resource Strain (CARDIA)    Difficulty of Paying Living Expenses: Not hard at all  Food Insecurity: No Food Insecurity (04/27/2023)   Hunger Vital Sign    Worried About Running Out of Food in the Last Year: Never true    Ran Out  of Food in the Last Year: Never true  Transportation Needs: No Transportation Needs (04/27/2023)   PRAPARE - Administrator, Civil Service (Medical): No    Lack of Transportation (Non-Medical): No  Physical Activity: Insufficiently Active (04/27/2023)   Exercise Vital Sign    Days of Exercise per Week: 7 days    Minutes of Exercise per Session: 20 min  Stress: No Stress Concern Present (04/27/2023)   [employer] of Occupational Health - Occupational Stress Questionnaire    Feeling of Stress : Not at all  Social Connections: Socially Integrated (04/27/2023)   Social Connection and Isolation Panel [NHANES]    Frequency of Communication with Friends and Family: More than three times a week    Frequency of Social Gatherings with Friends and Family: Three times a week    Attends Religious Services: More than 4 times per year    Active Member of Clubs or Organizations: Yes    Attends Engineer, structural: More than 4 times per year    Marital Status: Married    Tobacco Counseling Counseling given: Not Answered   Clinical Intake:  Pre-visit preparation completed: Yes  Pain : 0-10 Pain Score: 8  Pain Type: Chronic pain Pain Location: Generalized Pain Descriptors / Indicators: Aching Pain Onset: More than a month ago Pain Frequency: Constant     Nutritional Risks: None Diabetes: No  How often do you need to have  someone help you when you read instructions, pamphlets, or other written materials from your doctor or pharmacy?: 1 - Never  Interpreter Needed?: No  Information entered by :: NAllen LPN   Activities of Daily Living    04/27/2023    4:01 PM  In your present state of health, do you have any difficulty performing the following activities:  Hearing? 0  Vision? 0  Difficulty concentrating or making decisions? 0  Walking or climbing stairs? 1  Comment at times  Dressing or bathing? 0  Doing errands, shopping? 0  Preparing Food and eating ? N  Using the Toilet? N  In the past six months, have you accidently leaked urine? N  Do you have problems with loss of bowel control? N  Managing your Medications? N  Managing your Finances? N  Housekeeping or managing your Housekeeping? N    Patient Care Team: Arnette Felts, FNP as PCP - General (General Practice)  Indicate any recent Medical Services you may have received from other than [facility] providers in the past year (date may be approximate).     Assessment:   This is a routine wellness examination for Milady.  Hearing/Vision screen Hearing Screening - Comments:: Denies hearing issues Vision Screening - Comments:: Regular eye exams, [facility]   Goals Addressed             This Visit's Progress    Patient Stated       04/27/2023, keep losing weight       Depression Screen    04/27/2023    4:15 PM 06/24/2022    3:27 PM 04/05/2022    8:59 AM 10/17/2019   10:14 AM 10/11/2018   10:34 AM  PHQ 2/9 Scores  PHQ - 2 Score 0 0 0 1 2  PHQ- 9 Score     2    Fall Risk    04/27/2023    4:14 PM 06/24/2022    3:27 PM 04/05/2022    8:58 AM 10/17/2019   10:14 AM 10/11/2018   10:34 AM  Fall  Risk   Falls in the past year? 0 0 0 0 0  Number falls in past yr: 0 0 0    Injury with Fall? 0 0 0    Risk for fall due to : Medication side effect No Fall Risks History of fall(s)    Follow up Falls prevention discussed;Falls evaluation completed  Falls evaluation completed Falls evaluation completed      MEDICARE RISK AT HOME: Medicare Risk at Home Any stairs in or around the home?: Yes If so, are there any without handrails?: No Home free of loose throw rugs in walkways, pet beds, electrical cords, etc?: Yes Adequate lighting in your home to reduce risk of falls?: Yes Life alert?: No Use of a cane, walker or w/c?: No Grab bars in the bathroom?: No Shower chair or bench in shower?: No Elevated toilet seat or a handicapped toilet?: No  TIMED UP AND GO:  Was the test performed? No    Cognitive Function:        04/27/2023    4:15 PM  6CIT Screen  What Year? 0 points  What month? 0 points  What time? 0 points  Count back from 20 0 points  Months in reverse 0 points  Repeat phrase 0 points  Total Score 0 points    Immunizations Immunization History  Administered Date(s) Administered   Fluad Quad(high Dose 65+) 06/24/2022   Influenza,inj,Quad PF,6+ Mos 06/19/2018, 04/22/2020   Moderna SARS-COV2 Booster Vaccination 06/27/2020   Moderna Sars-Covid-2 Vaccination 10/25/2019, 11/27/2019, 06/27/2020, 11/20/2021   Tdap 06/24/2022   Zoster Recombinant(Shingrix) 04/05/2022, 05/20/2022    TDAP status: Up to date  Flu Vaccine status: Up to date  Pneumococcal vaccine status: Due, Education has been provided regarding the importance of this vaccine. Advised may receive this vaccine at local pharmacy or Health Dept. Aware to provide a copy of the vaccination record if obtained from local pharmacy or Health Dept. Verbalized acceptance and understanding.  Covid-19 vaccine status: Information provided on how to obtain vaccines.   Qualifies for Shingles Vaccine? Yes   Zostavax completed Yes   Shingrix Completed?: Yes  Screening Tests Health Maintenance  Topic Date Due   Pneumonia Vaccine 88+ Years old (1 of 1 - PCV) Never done   INFLUENZA VACCINE  03/17/2023   COVID-19 Vaccine (4 - 2023-24 season) 04/17/2023   Medicare  Annual Wellness (AWV)  04/26/2024   MAMMOGRAM  03/06/2025   Colonoscopy  02/23/2028   DTaP/Tdap/Td (2 - Td or Tdap) 06/24/2032   DEXA SCAN  Completed   Hepatitis C Screening  Completed   Zoster Vaccines- Shingrix  Completed   HPV VACCINES  Aged Out    Health Maintenance  Health Maintenance Due  Topic Date Due   Pneumonia Vaccine 3+ Years old (1 of 1 - PCV) Never done   INFLUENZA VACCINE  03/17/2023   COVID-19 Vaccine (4 - 2023-24 season) 04/17/2023    Colorectal cancer screening: Type of screening: Colonoscopy. Completed 02/22/2018. Repeat every 10 years  Mammogram status: Completed 03/07/2023. Repeat every year  Bone Density status: Completed 09/23/2022.   Lung Cancer Screening: (Low Dose CT Chest recommended if Age 35-80 years, 20 pack-year currently smoking OR have quit w/in 15years.) does not qualify.   Lung Cancer Screening Referral: no  Additional Screening:  Hepatitis C Screening: does qualify; Completed 09/19/2014  Vision Screening: Recommended annual ophthalmology exams for early detection of glaucoma and other disorders of the eye. Is the patient up to date with their annual eye exam?  Yes  Who is the provider or what is the name of the office in which the patient attends annual eye exams? [facility] If pt is not established with a provider, would they like to be referred to a provider to establish care? No .   Dental Screening: Recommended annual dental exams for proper oral hygiene  Diabetic Foot Exam: n/a  Community Resource Referral / Chronic Care Management: CRR required this visit?  No   CCM required this visit?  No     Plan:     I have personally reviewed and noted the following in the patient's chart:   Medical and social history Use of alcohol, tobacco or illicit drugs  Current medications and supplements including opioid prescriptions. Patient is not currently taking opioid prescriptions. Functional ability and status Nutritional status Physical  activity Advanced directives List of other physicians Hospitalizations, surgeries, and ER visits in previous 12 months Vitals Screenings to include cognitive, depression, and falls Referrals and appointments  In addition, I have reviewed and discussed with patient certain preventive protocols, quality metrics, and best practice recommendations. A written personalized care plan for preventive services as well as general preventive health recommendations were provided to patient.     Barb Merino, LPN   11/16/4740   After Visit Summary: (MyChart) Due to this being a telephonic visit, the after visit summary with patients personalized plan was offered to patient via MyChart   Nurse Notes: none

## 2023-04-27 NOTE — Patient Instructions (Signed)
Kathryn Gallegos , Thank you for taking time to come for your Medicare Wellness Visit. I appreciate your ongoing commitment to your health goals. Please review the following plan we discussed and let me know if I can assist you in the future.   Referrals/Orders/Follow-Ups/Clinician Recommendations: none  This is a list of the screening recommended for you and due dates:  Health Maintenance  Topic Date Due   Pneumonia Vaccine (1 of 1 - PCV) Never done   Flu Shot  03/17/2023   COVID-19 Vaccine (4 - 2023-24 season) 04/17/2023   Medicare Annual Wellness Visit  04/26/2024   Mammogram  03/06/2025   Colon Cancer Screening  02/23/2028   DTaP/Tdap/Td vaccine (2 - Td or Tdap) 06/24/2032   DEXA scan (bone density measurement)  Completed   Hepatitis C Screening  Completed   Zoster (Shingles) Vaccine  Completed   HPV Vaccine  Aged Out    Advanced directives: (ACP Link)Information on Advanced Care Planning can be found at [facility] Advance Health Care Directives Advance Health Care Directives ([email or web address]   Next Medicare Annual Wellness Visit scheduled for next year: No, office will schedule  Insert Preventive Care attachment Insert FALL PREVENTION attachment if needed

## 2023-05-24 ENCOUNTER — Ambulatory Visit: Payer: Medicare HMO | Attending: Cardiology | Admitting: Cardiology

## 2023-05-24 ENCOUNTER — Encounter: Payer: Self-pay | Admitting: Cardiology

## 2023-05-24 VITALS — BP 118/78 | HR 82 | Resp 16 | Ht 64.0 in | Wt 174.4 lb

## 2023-05-24 DIAGNOSIS — I471 Supraventricular tachycardia, unspecified: Secondary | ICD-10-CM | POA: Diagnosis not present

## 2023-05-24 DIAGNOSIS — I10 Essential (primary) hypertension: Secondary | ICD-10-CM | POA: Diagnosis not present

## 2023-05-24 DIAGNOSIS — E7800 Pure hypercholesterolemia, unspecified: Secondary | ICD-10-CM

## 2023-05-24 MED ORDER — DILTIAZEM HCL ER COATED BEADS 240 MG PO CP24
240.0000 mg | ORAL_CAPSULE | Freq: Every day | ORAL | 3 refills | Status: DC
Start: 2023-05-24 — End: 2023-12-19

## 2023-05-24 MED ORDER — ROSUVASTATIN CALCIUM 10 MG PO TABS: 10.0000 mg | ORAL_TABLET | Freq: Every day | ORAL | 3 refills | Status: DC

## 2023-05-24 NOTE — Progress Notes (Signed)
Cardiology Office Note:  .   Date:  05/24/2023  ID:  NARE GASPARI, DOB 1956/02/02, MRN [id number] PCP: Arnette Felts, FNP  [facility] Providers Cardiologist:  None    History of Present Illness: .   Kathryn Gallegos is a 67 y.o. female with hypertension, bronchial ashtma, hypertension, hyperlipidemia, and hyperglycemia, PSVT.  Commended on 05/08/2017, presents for annual visit.  Patient states that she is presently doing well and essentially remains asymptomatic and has not had any breakthrough SVT symptoms.  Review of Systems  Cardiovascular:  Negative for chest pain, dyspnea on exertion and leg swelling.    Risk Assessment/Calculations:     Lab Results  Component Value Date   CHOL 127 04/05/2022   HDL 59 04/05/2022   LDLCALC 52 04/05/2022   TRIG 83 04/05/2022   CHOLHDL 2.2 04/05/2022   Lab Results  Component Value Date   NA 141 06/24/2022   K 4.1 06/24/2022   CO2 24 06/24/2022   GLUCOSE 114 (H) 06/24/2022   BUN 11 06/24/2022   CREATININE 0.96 06/24/2022   CALCIUM 9.8 06/24/2022   EGFR 66 06/24/2022   GFRNONAA 73 07/23/2020   Lab Results  Component Value Date   WBC 5.5 04/05/2022   HGB 15.9 04/05/2022   HCT 47.4 (H) 04/05/2022   MCV 89 04/05/2022   PLT 207 04/05/2022    Physical Exam:   VS:  BP 118/78 (BP Location: Left Arm, Patient Position: Sitting, Cuff Size: Large)   Pulse 82   Resp 16   Ht 5\' 4"  (1.626 m)   Wt 174 lb 6.4 oz (79.1 kg)   SpO2 97%   BMI 29.94 kg/m    Wt Readings from Last 3 Encounters:  05/24/23 174 lb 6.4 oz (79.1 kg)  03/10/23 174 lb 9.6 oz (79.2 kg)  06/24/22 171 lb 12.8 oz (77.9 kg)     Physical Exam Neck:     Vascular: No carotid bruit or JVD.  Cardiovascular:     Rate and Rhythm: Normal rate and regular rhythm.     Pulses: Normal pulses and intact distal pulses.     Heart sounds: S1 normal and S2 normal. Murmur heard.     Early systolic murmur is present with a grade of 2/6 at the upper right sternal border.      No gallop.  Pulmonary:     Effort: Pulmonary effort is normal.     Breath sounds: Normal breath sounds.  Abdominal:     General: Bowel sounds are normal.     Palpations: Abdomen is soft.  Musculoskeletal:     Right lower leg: No edema.     Left lower leg: No edema.    Studies Reviewed: Marland Kitchen    EKG:    EKG Interpretation Date/Time:  Tuesday May 24 2023 16:11:06 EDT Ventricular Rate:  89 PR Interval:  164 QRS Duration:  84 QT Interval:  372 QTC Calculation: 452 R Axis:   24  Text Interpretation: EKG 05/24/2023: Normal sinus rhythm at rate of 89 bpm, normal EKG.  Compared to 05/07/2017, SVT with secondary ST-T abnormalities no longer present. Confirmed by Delrae Rend [phone]) on 05/24/2023 4:36:08 PM    Exercise sestamibi stress test 07/21/2018:  1. The patient performed treadmill exercise using Bruce protocol, completing 6:45 minutes. The patient completed an estimated workload of 8.2 METS, reaching 87% of the maximum predicted heart rate. Resting hypertension 150/100 mmHg with exaggerated exercise BP response, peak BP 230/110 mmHg. No stress symptoms reported. Exercise  capacity was normal. No ischemic changes seen on stress electrocardiogram.   2. The overall quality of the study is excellent. There is no evidence of abnormal lung activity. Stress and rest SPECT images demonstrate homogeneous tracer distribution throughout the myocardium. Gated SPECT imaging reveals normal myocardial thickening and wall motion. The left ventricular ejection fraction was normal (73%).  3. Low risk study.   PCV ECHOCARDIOGRAM COMPLETE 03/24/2021 Normal LV systolic function with visual EF 60-65%. Left ventricle cavity is normal in size. Normal left ventricular wall thickness. Normal global wall motion. Normal diastolic filling pattern, normal LAP. Mild (Grade I) mitral regurgitation. Compared to study 07/05/2018 no significant change.  ASSESSMENT AND PLAN: .      ICD-10-CM   1.  Supraventricular tachycardia, paroxysmal (HCC)  I47.10 EKG 12-Lead    diltiazem (CARDIZEM CD) 240 MG 24 hr capsule    2. Pure hypercholesterolemia  E78.00     3. Primary hypertension  I10 diltiazem (CARDIZEM CD) 240 MG 24 hr capsule      Assessment and Plan  1.  PSVT Patient is presently doing well and has had very minimal symptoms of palpitations and presently well-controlled on diltiazem CD to 40 mg daily.  She wishes to continue medical therapy for now.  She is aware of Valsalva maneuver.  2.  Hypercholesterolemia: Very well-controlled lipids on Crestor 10 mg daily, continue the same.   3.  Primary hypertension Blood pressure is well-controlled on diltiazem and also it is controlling her symptoms of PSVT, hence I do not recommend any changes.  She remains asymptomatic otherwise, I will see her back on a as needed basis and if she has recurrence of PSVT, I can certainly see her back sooner.  Again patient is aware of Valsalva maneuver.  Controlled primary prevention is recommended. Signed,  Yates Decamp, MD, [facility] 05/24/2023, 4:50 PM

## 2023-05-24 NOTE — Patient Instructions (Signed)
Medication Instructions:  Your physician recommends that you continue on your current medications as directed. Please refer to the Current Medication list given to you today.  *If you need a refill on your cardiac medications before your next appointment, please call your pharmacy*  Lab Work: If you have labs (blood work) drawn today and your tests are completely normal, you will receive your results only by: MyChart Message (if you have MyChart) OR A paper copy in the mail If you have any lab test that is abnormal or we need to change your treatment, we will call you to review the results.  Testing/Procedures: None ordered today.  Follow-Up: At [facility], you and your health needs are our priority.  As part of our continuing mission to provide you with exceptional heart care, we have created designated Provider Care Teams.  These Care Teams include your primary Cardiologist (physician) and Advanced Practice Providers (APPs -  Physician Assistants and Nurse Practitioners) who all work together to provide you with the care you need, when you need it.  We recommend signing up for the patient portal called "MyChart".  Sign up information is provided on this After Visit Summary.  MyChart is used to connect with patients for Virtual Visits (Telemedicine).  Patients are able to view lab/test results, encounter notes, upcoming appointments, etc.  Non-urgent messages can be sent to your provider as well.   To learn more about what you can do with MyChart, go to [email or web address].    Your next appointment:   As needed  Provider:   Yates Decamp, MD

## 2023-06-16 ENCOUNTER — Encounter: Payer: Medicare HMO | Admitting: Nurse Practitioner

## 2023-07-20 ENCOUNTER — Other Ambulatory Visit: Payer: Self-pay | Admitting: Orthopedic Surgery

## 2023-07-20 ENCOUNTER — Other Ambulatory Visit: Payer: Self-pay | Admitting: Nurse Practitioner

## 2023-07-20 DIAGNOSIS — Z9889 Other specified postprocedural states: Secondary | ICD-10-CM

## 2023-07-20 DIAGNOSIS — U071 COVID-19: Secondary | ICD-10-CM

## 2023-11-10 DIAGNOSIS — M810 Age-related osteoporosis without current pathological fracture: Secondary | ICD-10-CM | POA: Diagnosis not present

## 2023-11-10 DIAGNOSIS — G4700 Insomnia, unspecified: Secondary | ICD-10-CM | POA: Diagnosis not present

## 2023-11-10 DIAGNOSIS — Z1339 Encounter for screening examination for other mental health and behavioral disorders: Secondary | ICD-10-CM | POA: Diagnosis not present

## 2023-11-10 DIAGNOSIS — Z01419 Encounter for gynecological examination (general) (routine) without abnormal findings: Secondary | ICD-10-CM | POA: Diagnosis not present

## 2023-11-10 DIAGNOSIS — Z90710 Acquired absence of both cervix and uterus: Secondary | ICD-10-CM | POA: Diagnosis not present

## 2023-11-10 DIAGNOSIS — Z1239 Encounter for other screening for malignant neoplasm of breast: Secondary | ICD-10-CM | POA: Diagnosis not present

## 2023-11-10 DIAGNOSIS — Z1211 Encounter for screening for malignant neoplasm of colon: Secondary | ICD-10-CM | POA: Diagnosis not present

## 2023-11-10 DIAGNOSIS — F418 Other specified anxiety disorders: Secondary | ICD-10-CM | POA: Diagnosis not present

## 2023-12-15 ENCOUNTER — Other Ambulatory Visit: Payer: Self-pay

## 2023-12-15 DIAGNOSIS — I471 Supraventricular tachycardia, unspecified: Secondary | ICD-10-CM

## 2023-12-15 DIAGNOSIS — I10 Essential (primary) hypertension: Secondary | ICD-10-CM

## 2023-12-19 ENCOUNTER — Other Ambulatory Visit: Payer: Self-pay

## 2023-12-19 DIAGNOSIS — I10 Essential (primary) hypertension: Secondary | ICD-10-CM

## 2023-12-19 DIAGNOSIS — I471 Supraventricular tachycardia, unspecified: Secondary | ICD-10-CM

## 2023-12-19 MED ORDER — DILTIAZEM HCL ER COATED BEADS 240 MG PO CP24
240.0000 mg | ORAL_CAPSULE | Freq: Every day | ORAL | 1 refills | Status: AC
Start: 2023-12-19 — End: ?

## 2024-01-04 DIAGNOSIS — F418 Other specified anxiety disorders: Secondary | ICD-10-CM | POA: Diagnosis not present

## 2024-01-04 DIAGNOSIS — E7800 Pure hypercholesterolemia, unspecified: Secondary | ICD-10-CM | POA: Diagnosis not present

## 2024-01-04 DIAGNOSIS — Z0000 Encounter for general adult medical examination without abnormal findings: Secondary | ICD-10-CM | POA: Diagnosis not present

## 2024-01-04 DIAGNOSIS — Z1159 Encounter for screening for other viral diseases: Secondary | ICD-10-CM | POA: Diagnosis not present

## 2024-01-04 DIAGNOSIS — M810 Age-related osteoporosis without current pathological fracture: Secondary | ICD-10-CM | POA: Diagnosis not present

## 2024-01-04 DIAGNOSIS — I471 Supraventricular tachycardia, unspecified: Secondary | ICD-10-CM | POA: Diagnosis not present

## 2024-01-04 DIAGNOSIS — M5030 Other cervical disc degeneration, unspecified cervical region: Secondary | ICD-10-CM | POA: Diagnosis not present

## 2024-01-04 DIAGNOSIS — E559 Vitamin D deficiency, unspecified: Secondary | ICD-10-CM | POA: Diagnosis not present

## 2024-01-04 DIAGNOSIS — R7303 Prediabetes: Secondary | ICD-10-CM | POA: Diagnosis not present

## 2024-01-04 DIAGNOSIS — I10 Essential (primary) hypertension: Secondary | ICD-10-CM | POA: Diagnosis not present

## 2024-01-04 DIAGNOSIS — J4520 Mild intermittent asthma, uncomplicated: Secondary | ICD-10-CM | POA: Diagnosis not present

## 2024-02-01 ENCOUNTER — Other Ambulatory Visit: Payer: Self-pay | Admitting: Obstetrics and Gynecology

## 2024-02-01 DIAGNOSIS — Z1231 Encounter for screening mammogram for malignant neoplasm of breast: Secondary | ICD-10-CM

## 2024-02-16 ENCOUNTER — Ambulatory Visit: Admitting: Orthopedic Surgery

## 2024-02-16 VITALS — BP 153/93 | HR 76 | Ht 64.0 in | Wt 175.0 lb

## 2024-02-16 DIAGNOSIS — E559 Vitamin D deficiency, unspecified: Secondary | ICD-10-CM | POA: Insufficient documentation

## 2024-02-16 DIAGNOSIS — M654 Radial styloid tenosynovitis [de Quervain]: Secondary | ICD-10-CM

## 2024-02-16 DIAGNOSIS — M810 Age-related osteoporosis without current pathological fracture: Secondary | ICD-10-CM | POA: Insufficient documentation

## 2024-02-16 DIAGNOSIS — G8929 Other chronic pain: Secondary | ICD-10-CM | POA: Insufficient documentation

## 2024-02-16 NOTE — Progress Notes (Signed)
   Subjective:     Patient ID: Kathryn Gallegos, female   DOB: [birth year], 68 y.o.   MRN: [id number]  68 year old female presents with possible triggering of the left thumb and pain over the left wrist  She experiences increased pain when she reaches for her seatbelt with her left hand  The jumping/triggering phenomenon of the left thumb has ceased but the pain over the radial styloid has persisted    Wrist Pain      Review of Systems Right shoulder neck and back pain improved with injections    Objective:   Physical Exam Examination of the left upper extremity skin is warm dry and intact no erythema  She is tender over the first extensor compartment.  Volar aspect of the thumb nontender.  Normal passive range of motion with increased pain with Finkelstein's maneuver grip strength is normal.    Assessment:     Encounter Diagnosis  Name Primary?   De Quervain's tenosynovitis, left Yes       Plan:     Thumb splint Over-the-counter NSAIDs Ice 3 times a day Recheck in 6 weeks

## 2024-02-16 NOTE — Patient Instructions (Signed)
 ICE 3 X A DAY   ADVIL  OR ALEVE 2 X A DAY   BRACE 6 WEEKS

## 2024-02-16 NOTE — Progress Notes (Signed)
  Intake history:  There were no vitals taken for this visit. There is no height or weight on file to calculate BMI.    WHAT ARE WE SEEING YOU FOR TODAY?   left wrist(s)  How long has this bothered you? (DOI?DOS?WS?)  1 month(s) ago  Anticoag.  No  Diabetes No  Heart disease Yes  Hypertension Yes  SMOKING HX No  Kidney disease No  Any ALLERGIES ______________________________________________   Treatment:  Have you taken:  Tylenol  Yes  Advil  Yes  Had PT No  Had injection No  Other  _________________________

## 2024-03-07 ENCOUNTER — Ambulatory Visit

## 2024-03-08 ENCOUNTER — Ambulatory Visit
Admission: RE | Admit: 2024-03-08 | Discharge: 2024-03-08 | Disposition: A | Discharge status: Home / Self Care | Source: Ambulatory Visit | Attending: Obstetrics and Gynecology | Admitting: Obstetrics and Gynecology

## 2024-03-08 DIAGNOSIS — Z1231 Encounter for screening mammogram for malignant neoplasm of breast: Secondary | ICD-10-CM | POA: Diagnosis not present

## 2024-03-14 DIAGNOSIS — H524 Presbyopia: Secondary | ICD-10-CM | POA: Diagnosis not present

## 2024-03-14 DIAGNOSIS — H25013 Cortical age-related cataract, bilateral: Secondary | ICD-10-CM | POA: Diagnosis not present

## 2024-03-29 ENCOUNTER — Encounter: Payer: Self-pay | Admitting: Orthopedic Surgery

## 2024-03-29 ENCOUNTER — Ambulatory Visit: Admitting: Orthopedic Surgery

## 2024-03-29 DIAGNOSIS — M654 Radial styloid tenosynovitis [de Quervain]: Secondary | ICD-10-CM | POA: Diagnosis not present

## 2024-03-29 MED ORDER — CELECOXIB 200 MG PO CAPS: 200.0000 mg | ORAL_CAPSULE | Freq: Two times a day (BID) | ORAL | 2 refills | Status: AC

## 2024-03-29 NOTE — Progress Notes (Signed)
 Chief Complaint  Patient presents with   Hand Pain    Left thumb   Encounter Diagnosis  Name Primary?   De Quervain's tenosynovitis, left Yes   Reexamination  Tenderness in the first extensor compartment pain with ulnar deviation  Wrist flexion extension normal full range of motion of the wrist and hand  Still symptomatic but definitely improvement recommend continue wrist  splint and start Celebrex   Recheck 4 to 6 weeks

## 2024-03-29 NOTE — Progress Notes (Signed)
   There were no vitals taken for this visit.  There is no height or weight on file to calculate BMI.  Chief Complaint  Patient presents with   Hand Pain    Left thumb    No diagnosis found.  DOI/DOS/ Date: ongoing  Improved but still has pain

## 2024-04-02 DIAGNOSIS — L814 Other melanin hyperpigmentation: Secondary | ICD-10-CM | POA: Diagnosis not present

## 2024-04-02 DIAGNOSIS — D485 Neoplasm of uncertain behavior of skin: Secondary | ICD-10-CM | POA: Diagnosis not present

## 2024-04-02 DIAGNOSIS — L820 Inflamed seborrheic keratosis: Secondary | ICD-10-CM | POA: Diagnosis not present

## 2024-05-10 ENCOUNTER — Ambulatory Visit: Admitting: Orthopedic Surgery

## 2024-05-10 DIAGNOSIS — I10 Essential (primary) hypertension: Secondary | ICD-10-CM | POA: Diagnosis not present

## 2024-05-15 DIAGNOSIS — J4520 Mild intermittent asthma, uncomplicated: Secondary | ICD-10-CM | POA: Diagnosis not present

## 2024-05-15 DIAGNOSIS — F418 Other specified anxiety disorders: Secondary | ICD-10-CM | POA: Diagnosis not present

## 2024-05-15 DIAGNOSIS — M810 Age-related osteoporosis without current pathological fracture: Secondary | ICD-10-CM | POA: Diagnosis not present

## 2024-05-15 DIAGNOSIS — E7800 Pure hypercholesterolemia, unspecified: Secondary | ICD-10-CM | POA: Diagnosis not present

## 2024-05-21 ENCOUNTER — Ambulatory Visit: Admitting: Orthopedic Surgery

## 2024-05-24 ENCOUNTER — Ambulatory Visit: Admitting: Orthopedic Surgery

## 2024-05-30 ENCOUNTER — Other Ambulatory Visit: Payer: Self-pay

## 2024-05-31 ENCOUNTER — Encounter: Payer: Self-pay | Admitting: Orthopedic Surgery

## 2024-05-31 ENCOUNTER — Ambulatory Visit: Admitting: Orthopedic Surgery

## 2024-05-31 DIAGNOSIS — M654 Radial styloid tenosynovitis [de Quervain]: Secondary | ICD-10-CM

## 2024-05-31 MED ORDER — METHYLPREDNISOLONE ACETATE 40 MG/ML IJ SUSP
40.0000 mg | Freq: Once | INTRAMUSCULAR | Status: AC
  Administered 2024-05-31: 40 mg via INTRA_ARTICULAR

## 2024-05-31 MED ORDER — METHYLPREDNISOLONE ACETATE 40 MG/ML IJ SUSP: 40.0000 mg | Freq: Once | INTRAMUSCULAR | Status: AC

## 2024-05-31 MED ORDER — ROSUVASTATIN CALCIUM 10 MG PO TABS: 10.0000 mg | ORAL_TABLET | Freq: Every day | ORAL | 0 refills | Status: DC

## 2024-05-31 MED ORDER — HYDROCODONE-ACETAMINOPHEN 5-325 MG PO TABS: 1.0000 | ORAL_TABLET | Freq: Four times a day (QID) | ORAL | 0 refills | Status: AC | PRN

## 2024-05-31 MED ORDER — ROSUVASTATIN CALCIUM 10 MG PO TABS: 10.0000 mg | ORAL_TABLET | Freq: Every day | ORAL | 3 refills | Status: DC

## 2024-05-31 NOTE — Progress Notes (Signed)
    05/31/2024   Chief Complaint  Patient presents with   Wrist Pain    Left/ wrist/ thumb painful using Celebrex  not helping wants Hydrocodone      No diagnosis found.  What pharmacy do you use ? [facility]___  DOI/DOS/ Date:    Worse

## 2024-05-31 NOTE — Progress Notes (Addendum)
  Subjective:     Patient ID: Kathryn Gallegos, female   DOB: [birth year], 68 y.o.   MRN: [id number]  Chief Complaint  Patient presents with   Wrist Pain    Left/ wrist/ thumb painful using Celebrex  not helping wants Hydrocodone      68 year old female left wrist pain recurrent de Quervain's tenosynovitis unresponsive to Celebrex  and splinting    Wrist Pain      Review of Systems no carpal tunnel symptoms     Objective:   Physical Exam Tenderness over the first extensor compartment with positive Finkelstein's test    Assessment:     Everitt Quervain's syndrome left wrist recurrent    Plan:     Injection   Procedure note injection left wrist for de Quervain's syndrome The patient has consented for injection of THE Left wrist for de Quervain's syndrome 40 mg of Depo-Medrol  1 mL, 3 mL 1% lidocaine . Patient gave verbal consent timeout to confirm site of injection  Sterile technique ethyl chloride used for skin prep  No complications   The patient would like some hydrocodone  for pain  We discussed the risks of taking opioids  Prescription for 20 tablets  Meds ordered this encounter  Medications   methylPREDNISolone  acetate (DEPO-MEDROL ) injection 40 mg   methylPREDNISolone  acetate (DEPO-MEDROL ) injection 40 mg   HYDROcodone -acetaminophen  (NORCO/VICODIN) 5-325 MG tablet    Sig: Take 1 tablet by mouth every 6 (six) hours as needed for moderate pain (pain score 4-6).    Dispense:  20 tablet    Refill:  0

## 2024-05-31 NOTE — Addendum Note (Signed)
 Addended by: BLUFORD RAMP D on: 05/31/2024 03:21 PM   Modules accepted: Orders

## 2024-06-08 DIAGNOSIS — I10 Essential (primary) hypertension: Secondary | ICD-10-CM | POA: Diagnosis not present

## 2024-06-13 ENCOUNTER — Telehealth: Payer: Self-pay | Admitting: Nurse Practitioner

## 2024-06-13 NOTE — Telephone Encounter (Signed)
 Called pt to see if she was still a pt here because she has not been seen by provider Georgina since 2023. Pt said no because she did not like something that was said to her due to her not knowing she had covid. She will not be returning to this office.

## 2024-06-15 DIAGNOSIS — M810 Age-related osteoporosis without current pathological fracture: Secondary | ICD-10-CM | POA: Diagnosis not present

## 2024-06-15 DIAGNOSIS — F418 Other specified anxiety disorders: Secondary | ICD-10-CM | POA: Diagnosis not present

## 2024-06-15 DIAGNOSIS — J4520 Mild intermittent asthma, uncomplicated: Secondary | ICD-10-CM | POA: Diagnosis not present

## 2024-06-15 DIAGNOSIS — I10 Essential (primary) hypertension: Secondary | ICD-10-CM | POA: Diagnosis not present

## 2024-06-15 DIAGNOSIS — E7800 Pure hypercholesterolemia, unspecified: Secondary | ICD-10-CM | POA: Diagnosis not present

## 2024-06-28 ENCOUNTER — Ambulatory Visit: Admitting: Orthopedic Surgery

## 2024-06-28 DIAGNOSIS — M654 Radial styloid tenosynovitis [de Quervain]: Secondary | ICD-10-CM

## 2024-06-28 MED ORDER — METHYLPREDNISOLONE ACETATE 40 MG/ML IJ SUSP
40.0000 mg | Freq: Once | INTRAMUSCULAR | Status: AC
  Administered 2024-06-28: 40 mg via INTRA_ARTICULAR

## 2024-06-28 NOTE — Progress Notes (Signed)
   This is a follow-up visit  Encounter Diagnoses  Name Primary?   De Quervain's tenosynovitis, left Yes   De Quervain's tenosynovitis, right    68 year old female status post injection left wrist for de Quervain's syndrome she is also wearing a brace and taking Celebrex  with improvement  However we have pain in the first extensor compartment of the right wrist with similar symptoms of painful ulnar deviation  Examination of the right wrist does reveal tenderness and swelling and nodularity over the first extensor compartment Positive Finkelstein's Skin intact Neurovascular exam normal  Patient would like to proceed with injection bracing and continue Celebrex    Procedure note injection RIGHT wrist for de Quervain's syndrome  The patient has consented for injection of THE RIGHT wrist for de Quervain's syndrome 40 mg of Depo-Medrol  1 mL, 3 mL 1% lidocaine . Patient gave verbal consent timeout to confirm site of injection  Sterile technique ethyl chloride used for skin prep  No complications   Encounter Diagnoses  Name Primary?   De Quervain's tenosynovitis, left Yes   De Quervain's tenosynovitis, right     Recheck 4 weeks

## 2024-06-28 NOTE — Progress Notes (Signed)
    06/28/2024   Chief Complaint  Patient presents with   Wrist Pain    L feeling better but pain is now in the R     No diagnosis found.  What pharmacy do you use ? [facility]  Did you get better, worse or no change (Answer below)   Improved on L now having pain on the R for 1-2 wks

## 2024-06-28 NOTE — Addendum Note (Signed)
 Addended byBETHA JENEAN GREIG LELON on: 06/28/2024 04:32 PM   Modules accepted: Orders

## 2024-07-02 ENCOUNTER — Other Ambulatory Visit: Payer: Self-pay | Admitting: Nurse Practitioner

## 2024-07-02 DIAGNOSIS — U071 COVID-19: Secondary | ICD-10-CM

## 2024-07-03 ENCOUNTER — Other Ambulatory Visit: Payer: Self-pay | Admitting: Nurse Practitioner

## 2024-07-03 DIAGNOSIS — U071 COVID-19: Secondary | ICD-10-CM

## 2024-07-06 DIAGNOSIS — R748 Abnormal levels of other serum enzymes: Secondary | ICD-10-CM | POA: Diagnosis not present

## 2024-07-06 DIAGNOSIS — I471 Supraventricular tachycardia, unspecified: Secondary | ICD-10-CM | POA: Diagnosis not present

## 2024-07-06 DIAGNOSIS — J4520 Mild intermittent asthma, uncomplicated: Secondary | ICD-10-CM | POA: Diagnosis not present

## 2024-07-06 DIAGNOSIS — E7800 Pure hypercholesterolemia, unspecified: Secondary | ICD-10-CM | POA: Diagnosis not present

## 2024-07-06 DIAGNOSIS — I10 Essential (primary) hypertension: Secondary | ICD-10-CM | POA: Diagnosis not present

## 2024-07-06 DIAGNOSIS — M545 Low back pain, unspecified: Secondary | ICD-10-CM | POA: Diagnosis not present

## 2024-07-06 DIAGNOSIS — G8929 Other chronic pain: Secondary | ICD-10-CM | POA: Diagnosis not present

## 2024-07-06 DIAGNOSIS — R7303 Prediabetes: Secondary | ICD-10-CM | POA: Diagnosis not present

## 2024-07-06 DIAGNOSIS — M5030 Other cervical disc degeneration, unspecified cervical region: Secondary | ICD-10-CM | POA: Diagnosis not present

## 2024-07-06 DIAGNOSIS — M810 Age-related osteoporosis without current pathological fracture: Secondary | ICD-10-CM | POA: Diagnosis not present

## 2024-07-08 DIAGNOSIS — I10 Essential (primary) hypertension: Secondary | ICD-10-CM | POA: Diagnosis not present

## 2024-07-15 DIAGNOSIS — F418 Other specified anxiety disorders: Secondary | ICD-10-CM | POA: Diagnosis not present

## 2024-07-15 DIAGNOSIS — J4520 Mild intermittent asthma, uncomplicated: Secondary | ICD-10-CM | POA: Diagnosis not present

## 2024-07-15 DIAGNOSIS — I10 Essential (primary) hypertension: Secondary | ICD-10-CM | POA: Diagnosis not present

## 2024-07-15 DIAGNOSIS — M810 Age-related osteoporosis without current pathological fracture: Secondary | ICD-10-CM | POA: Diagnosis not present

## 2024-07-15 DIAGNOSIS — E7800 Pure hypercholesterolemia, unspecified: Secondary | ICD-10-CM | POA: Diagnosis not present

## 2024-07-26 ENCOUNTER — Ambulatory Visit: Admitting: Orthopedic Surgery

## 2024-08-03 ENCOUNTER — Other Ambulatory Visit: Payer: Self-pay | Admitting: Cardiology

## 2024-08-03 DIAGNOSIS — I10 Essential (primary) hypertension: Secondary | ICD-10-CM

## 2024-08-03 DIAGNOSIS — I471 Supraventricular tachycardia, unspecified: Secondary | ICD-10-CM

## 2024-08-03 MED ORDER — DILTIAZEM HCL ER COATED BEADS 240 MG PO CP24: 240.0000 mg | ORAL_CAPSULE | Freq: Every day | ORAL | 0 refills | Status: DC

## 2024-08-31 ENCOUNTER — Other Ambulatory Visit: Payer: Self-pay | Admitting: Cardiology

## 2024-08-31 DIAGNOSIS — I10 Essential (primary) hypertension: Secondary | ICD-10-CM

## 2024-08-31 DIAGNOSIS — I471 Supraventricular tachycardia, unspecified: Secondary | ICD-10-CM

## 2024-09-04 NOTE — Telephone Encounter (Signed)
 In accordance with refill protocols, please review and address the following requirements before this medication refill can be authorized:  Labs

## 2024-09-04 NOTE — Telephone Encounter (Signed)
 I made the patient PRN, Please either give 30 tab and forward to PCP or just reject and forward to PCP

## 2024-09-05 ENCOUNTER — Other Ambulatory Visit: Payer: Self-pay | Admitting: Cardiology

## 2024-09-05 ENCOUNTER — Other Ambulatory Visit (HOSPITAL_COMMUNITY): Payer: Self-pay

## 2024-09-05 DIAGNOSIS — I471 Supraventricular tachycardia, unspecified: Secondary | ICD-10-CM

## 2024-09-05 DIAGNOSIS — I10 Essential (primary) hypertension: Secondary | ICD-10-CM

## 2024-09-05 MED ORDER — DILTIAZEM HCL ER COATED BEADS 240 MG PO CP24
240.0000 mg | ORAL_CAPSULE | Freq: Every day | ORAL | 0 refills | Status: DC
  Filled 2024-09-05: qty 30, 30d supply, fill #0

## 2024-09-05 NOTE — Telephone Encounter (Signed)
 Patient walked in for diltiazem  refill. Overdue for OV. Scheduled for tomorrow 1/22 @ 3:40pm.   For her diltiazem  240mg  every day, she is requesting a blue pill and not generic. Pharmacy team at [facility] notified.   Of note, Dr. Ladona did make patient PRN as last visit and OK'ed #30 refill and defer to PCP, but no PCP on file.

## 2024-09-05 NOTE — Telephone Encounter (Signed)
 Left message for patient that OV scheduled tomorrow is ONLY needed if she needs cardiac care, otherwise PRN.

## 2024-09-06 ENCOUNTER — Encounter: Payer: Self-pay | Admitting: Cardiology

## 2024-09-06 ENCOUNTER — Ambulatory Visit: Admitting: Cardiology

## 2024-09-06 ENCOUNTER — Other Ambulatory Visit (HOSPITAL_COMMUNITY): Payer: Self-pay

## 2024-09-06 VITALS — BP 140/81 | HR 74 | Ht 62.0 in | Wt 177.5 lb

## 2024-09-06 DIAGNOSIS — G478 Other sleep disorders: Secondary | ICD-10-CM | POA: Diagnosis not present

## 2024-09-06 DIAGNOSIS — E782 Mixed hyperlipidemia: Secondary | ICD-10-CM

## 2024-09-06 DIAGNOSIS — R0683 Snoring: Secondary | ICD-10-CM

## 2024-09-06 DIAGNOSIS — I471 Supraventricular tachycardia, unspecified: Secondary | ICD-10-CM

## 2024-09-06 DIAGNOSIS — I10 Essential (primary) hypertension: Secondary | ICD-10-CM | POA: Diagnosis not present

## 2024-09-06 MED ORDER — ROSUVASTATIN CALCIUM 10 MG PO TABS
10.0000 mg | ORAL_TABLET | Freq: Every day | ORAL | 3 refills | Status: AC
  Filled 2024-09-06: qty 90, 90d supply, fill #0

## 2024-09-06 MED ORDER — DILTIAZEM HCL ER COATED BEADS 240 MG PO CP24
240.0000 mg | ORAL_CAPSULE | Freq: Every day | ORAL | 3 refills | Status: AC
  Filled 2024-09-06: qty 90, 90d supply, fill #0

## 2024-09-06 NOTE — Progress Notes (Signed)
 " Cardiology Office Note:  .   Date:  09/06/2024  ID:  Kathryn Gallegos, DOB 07-18-1956, MRN [id number] PCP: Vernon Velna SAUNDERS, MD  [facility] Providers Cardiologist:  Gordy Bergamo, MD   History of Present Illness: .   Kathryn Gallegos is a 69 y.o. female with hypertension, bronchial ashtma, hypertension, hyperlipidemia, and hyperglycemia, PSVT on 05/08/2017 presents for annual visit. Remains asymptomatic, wants to see me annually.     Discussed the use of AI scribe software for clinical note transcription with the patient, who gave verbal consent to proceed.  History of Present Illness Kathryn Gallegos is a 69 year old female with supraventricular tachycardia and hypertension who presents for a cardiovascular follow-up.  She had recurrent palpitations when her diltiazem  CD changed from her usual blue pill to an orange pill. Palpitations resolved after returning to the blue formulation and changing pharmacies. She is currently taking diltiazem  CD and rosuvastatin .  She feels tired on waking despite 8 hours of sleep and has loud snoring and morning headaches. A prior sleep study did not lead to CPAP. She denies daytime fatigue or inappropriate sleep episodes.  Her weight is stable. She has hypertriglyceridemia with a prior triglyceride level of 309 mg/dL and reports high intake of sugar and starch.  She stopped gabapentin  due to adverse effects and is no longer using prescription ibuprofen . She now uses over-the-counter Tylenol  for pain.  Her family history includes a first cousin with similar heart problems. She has hypertension with recent home blood pressure readings reported as well controlled.  Cardiac Studies relevent.   Echocardiogram 03/24/2021: Normal LV systolic function with visual EF 60-65%. Left ventricle cavity is normal in size. Normal left ventricular wall thickness. Normal global wall motion. Normal diastolic filling pattern, normal LAP. Mild (Grade I) mitral  regurgitation. Compared to study 07/05/2018 no significant change.  Exercise sestamibi stress test 07/21/2018:  1. The patient performed treadmill exercise using Bruce protocol, completing 6:45 minutes. The patient completed an estimated workload of 8.2 METS, reaching 87% of the maximum predicted heart rate. Resting hypertension 150/100 mmHg with exaggerated exercise BP response, peak BP 230/110 mmHg. No stress symptoms reported. Exercise capacity was normal. No ischemic changes seen on stress electrocardiogram.   2. The overall quality of the study is excellent. There is no evidence of abnormal lung activity. Stress and rest SPECT images demonstrate homogeneous tracer distribution throughout the myocardium. Gated SPECT imaging reveals normal myocardial thickening and wall motion. The left ventricular ejection fraction was normal (73%).  3. Low risk study. EKG:   EKG Interpretation Date/Time:  Thursday September 06 2024 15:40:54 EST Ventricular Rate:  84 PR Interval:  152 QRS Duration:  78 QT Interval:  380 QTC Calculation: 449 R Axis:   7  Text Interpretation: EKG 09/06/2024: Normal sinus rhythm with sinus arrhythmia at rate of 84 bpm, normal EKG.  No change from 05/24/2023.  Compared to 05/07/2017, SVT with secondary ST-T wave changes no longer present. Confirmed by Zyire Eidson, Jagadeesh [phone]) on 09/06/2024 3:57:21 PM  Labs   Lab Results  Component Value Date   TSH 1.020 04/05/2022    Care everywhere/Faxed External Labs:  Labs 01/04/2024:  Vitamin D61.7.  Hb 15.1/HCT 46.2, platelets 205.  Normal indicis.  Total cholesterol 143, triglycerides 309, HDL 46, LDL 49.  Serum glucose 117 mg, BUN 11, creatinine 0.87, eGFR 73 mL, potassium 3.7.  LFTs normal.  Alkaline phosphatase minimally elevated at 141.  A1c 6.1%.  ROS  Review of Systems  Cardiovascular:  Negative for chest pain, dyspnea on exertion and leg swelling.   Physical Exam:   VS:  BP (!) 140/81 (BP Location: Left Arm, Patient  Position: Sitting, Cuff Size: Normal)   Pulse 74   Ht 5' 2 (1.575 m)   Wt 177 lb 8 oz (80.5 kg)   SpO2 98%   BMI 32.47 kg/m    Wt Readings from Last 3 Encounters:  09/06/24 177 lb 8 oz (80.5 kg)  02/16/24 175 lb (79.4 kg)  05/24/23 174 lb 6.4 oz (79.1 kg)    BP Readings from Last 3 Encounters:  09/06/24 (!) 140/81  02/16/24 (!) 153/93  05/24/23 118/78   Physical Exam Constitutional:      Appearance: She is obese.  Neck:     Vascular: No carotid bruit or JVD.  Cardiovascular:     Rate and Rhythm: Normal rate and regular rhythm.     Pulses: Intact distal pulses.     Heart sounds: Normal heart sounds. No murmur heard.    No gallop.  Pulmonary:     Effort: Pulmonary effort is normal.     Breath sounds: Normal breath sounds.  Abdominal:     General: Bowel sounds are normal.     Palpations: Abdomen is soft.  Musculoskeletal:     Right lower leg: No edema.     Left lower leg: No edema.    ASSESSMENT AND PLAN: .      ICD-10-CM   1. Primary hypertension  I10 EKG 12-Lead    diltiazem  (CARDIZEM  CD) 240 MG 24 hr capsule    2. Mixed hyperlipidemia  E78.2 EKG 12-Lead    rosuvastatin  (CRESTOR ) 10 MG tablet    3. Supraventricular tachycardia, paroxysmal  I47.10 diltiazem  (CARDIZEM  CD) 240 MG 24 hr capsule    4. Snoring  R06.83     5. Non-restorative sleep  G47.8      Assessment & Plan Supraventricular tachycardia Well-managed with diltiazem . Palpitations occurred with incorrect medication formulation. - Continue diltiazem  CD (blue pill) for supraventricular tachycardia. - Ensured correct medication formulation is dispensed.  Primary hypertension Blood pressure is slightly elevated at 140/81 mmHg, indicating suboptimal control. Home monitoring shows better control with readings in the 120s to 130s. - Monitor blood pressure regularly at home.  Mixed hyperlipidemia Triglycerides are elevated at 309 mg/dL, likely due to dietary intake of sugars and starches. LDL  cholesterol is well-controlled with rosuvastatin . - Continue rosuvastatin  10 mg daily. - Advised dietary modifications to reduce sugar and starch intake. - Recommended rechecking cholesterol levels with primary care provider.  Suspected obstructive sleep apnea Symptoms of fatigue upon waking and loud snoring suggest obstructive sleep apnea. Previous sleep study did not indicate CPAP requirement, but symptoms warrant reconsideration. - Recommended discussing sleep study with primary care provider. - Advised on potential CPAP therapy if sleep study indicates need.  Obesity BMI is 32, indicating mild obesity. Weight management is important for overall health and cardiovascular risk reduction. - Advised on dietary changes to reduce sugar and starch intake.  Follow up: 1 Year PSVT, Hypertension, Mixed hyperlipidemia   Signed,  Gordy Bergamo, MD, [facility] 09/06/2024, 4:12 PM [facility] [address] [place], [place] [address] Phone: [phone]. Fax:  [phone]  "

## 2024-09-06 NOTE — Patient Instructions (Signed)
" °  VISIT SUMMARY: Today we discussed your cardiovascular health, including your supraventricular tachycardia, hypertension, hyperlipidemia, and potential sleep apnea. We also reviewed your current medications and made some recommendations for lifestyle changes.  YOUR PLAN: SUPRAVENTRICULAR TACHYCARDIA: Your palpitations were related to an incorrect medication formulation. -Continue taking diltiazem  CD (blue pill) for supraventricular tachycardia. -Ensure the correct medication formulation is dispensed.  PRIMARY HYPERTENSION: Your blood pressure is slightly elevated at 140/81 mmHg, but home readings are better controlled. -Monitor your blood pressure regularly at home.  MIXED HYPERLIPIDEMIA: Your triglycerides are elevated, likely due to your diet, but your LDL cholesterol is well-controlled with rosuvastatin . -Continue taking rosuvastatin  10 mg daily. -Make dietary changes to reduce sugar and starch intake. -Recheck your cholesterol levels with your primary care provider.  SUSPECTED OBSTRUCTIVE SLEEP APNEA: You have symptoms that suggest obstructive sleep apnea, such as fatigue upon waking and loud snoring. -Discuss a sleep study with your primary care provider. -Consider CPAP therapy if the sleep study indicates a need.  OBESITY: Your BMI is 32, indicating mild obesity. -Make dietary changes to reduce sugar and starch intake.    Contains text generated by Abridge.   VISIT SUMMARY: Today we discussed your cardiovascular health, including your supraventricular tachycardia, hypertension, hyperlipidemia, and potential sleep apnea. We also reviewed your current medications and made some recommendations for lifestyle changes.  YOUR PLAN: SUPRAVENTRICULAR TACHYCARDIA: Your palpitations were related to an incorrect medication formulation. -Continue taking diltiazem  CD (blue pill) for supraventricular tachycardia. -Ensure the correct medication formulation is dispensed.  PRIMARY  HYPERTENSION: Your blood pressure is slightly elevated at 140/81 mmHg, but home readings are better controlled. -Monitor your blood pressure regularly at home.  MIXED HYPERLIPIDEMIA: Your triglycerides are elevated, likely due to your diet, but your LDL cholesterol is well-controlled with rosuvastatin . -Continue taking rosuvastatin  10 mg daily. -Make dietary changes to reduce sugar and starch intake. -Recheck your cholesterol levels with your primary care provider.  SUSPECTED OBSTRUCTIVE SLEEP APNEA: You have symptoms that suggest obstructive sleep apnea, such as fatigue upon waking and loud snoring. -Discuss a sleep study with your primary care provider. -Consider CPAP therapy if the sleep study indicates a need.  OBESITY: Your BMI is 32, indicating mild obesity. -Make dietary changes to reduce sugar and starch intake.    Contains text generated by Abridge.   "
